# Patient Record
Sex: Female | Born: 1950 | Race: White | Hispanic: No | Marital: Married | Smoking: Never smoker
Health system: Southern US, Community
[De-identification: ages and names within clinical notes are randomized; demographics above are authoritative.]

## PROBLEM LIST (undated history)

## (undated) DIAGNOSIS — K209 Esophagitis, unspecified without bleeding: Secondary | ICD-10-CM

## (undated) DIAGNOSIS — M170 Bilateral primary osteoarthritis of knee: Secondary | ICD-10-CM

## (undated) DIAGNOSIS — J45909 Unspecified asthma, uncomplicated: Secondary | ICD-10-CM

## (undated) DIAGNOSIS — K5790 Diverticulosis of intestine, part unspecified, without perforation or abscess without bleeding: Secondary | ICD-10-CM

## (undated) DIAGNOSIS — J069 Acute upper respiratory infection, unspecified: Secondary | ICD-10-CM

## (undated) DIAGNOSIS — K219 Gastro-esophageal reflux disease without esophagitis: Secondary | ICD-10-CM

## (undated) HISTORY — PX: ABDOMINAL HYSTERECTOMY: SHX81

## (undated) HISTORY — PX: JOINT REPLACEMENT: SHX530

## (undated) HISTORY — DX: Acute upper respiratory infection, unspecified: J06.9

## (undated) HISTORY — PX: ADENOIDECTOMY: SUR15

## (undated) HISTORY — DX: Bilateral primary osteoarthritis of knee: M17.0

## (undated) HISTORY — PX: TONSILLECTOMY: SUR1361

---

## 1993-01-03 HISTORY — PX: FOOT SURGERY: SHX648

## 1998-04-02 ENCOUNTER — Encounter: Admission: RE | Admit: 1998-04-02 | Discharge: 1998-04-15 | Payer: Self-pay | Admitting: Orthopaedic Surgery

## 1999-10-14 ENCOUNTER — Encounter: Admission: RE | Admit: 1999-10-14 | Discharge: 1999-10-14 | Payer: Self-pay | Admitting: *Deleted

## 1999-10-14 ENCOUNTER — Encounter: Payer: Self-pay | Admitting: *Deleted

## 1999-11-16 ENCOUNTER — Other Ambulatory Visit: Admission: RE | Admit: 1999-11-16 | Discharge: 1999-11-16 | Payer: Self-pay | Admitting: Gynecology

## 2001-03-16 ENCOUNTER — Encounter: Payer: Self-pay | Admitting: *Deleted

## 2001-03-16 ENCOUNTER — Encounter: Admission: RE | Admit: 2001-03-16 | Discharge: 2001-03-16 | Payer: Self-pay | Admitting: *Deleted

## 2002-03-05 ENCOUNTER — Other Ambulatory Visit: Admission: RE | Admit: 2002-03-05 | Discharge: 2002-03-05 | Payer: Self-pay | Admitting: Gynecology

## 2002-07-22 ENCOUNTER — Encounter: Admission: RE | Admit: 2002-07-22 | Discharge: 2002-07-22 | Payer: Self-pay

## 2009-12-02 ENCOUNTER — Encounter: Admission: RE | Admit: 2009-12-02 | Discharge: 2009-12-02 | Payer: Self-pay | Admitting: Gastroenterology

## 2009-12-04 ENCOUNTER — Ambulatory Visit (HOSPITAL_COMMUNITY)
Admission: RE | Admit: 2009-12-04 | Discharge: 2009-12-04 | Payer: Self-pay | Source: Home / Self Care | Admitting: Gastroenterology

## 2009-12-08 ENCOUNTER — Other Ambulatory Visit
Admission: RE | Admit: 2009-12-08 | Discharge: 2009-12-08 | Payer: Self-pay | Source: Home / Self Care | Admitting: Gastroenterology

## 2009-12-23 ENCOUNTER — Ambulatory Visit (HOSPITAL_COMMUNITY)
Admission: RE | Admit: 2009-12-23 | Discharge: 2009-12-23 | Payer: Self-pay | Source: Home / Self Care | Attending: Gastroenterology | Admitting: Gastroenterology

## 2010-02-23 ENCOUNTER — Other Ambulatory Visit: Payer: Self-pay | Admitting: Dermatology

## 2011-03-01 ENCOUNTER — Other Ambulatory Visit: Payer: Self-pay | Admitting: Dermatology

## 2011-10-11 ENCOUNTER — Other Ambulatory Visit: Payer: Self-pay | Admitting: Dermatology

## 2011-12-08 ENCOUNTER — Other Ambulatory Visit: Payer: Self-pay | Admitting: Family Medicine

## 2011-12-08 DIAGNOSIS — R918 Other nonspecific abnormal finding of lung field: Secondary | ICD-10-CM

## 2011-12-15 ENCOUNTER — Ambulatory Visit
Admission: RE | Admit: 2011-12-15 | Discharge: 2011-12-15 | Disposition: A | Discharge status: Home / Self Care | Payer: Managed Care, Other (non HMO) | Source: Ambulatory Visit | Attending: Family Medicine | Admitting: Family Medicine

## 2011-12-15 DIAGNOSIS — R918 Other nonspecific abnormal finding of lung field: Secondary | ICD-10-CM

## 2011-12-15 MED ORDER — IOHEXOL 300 MG/ML  SOLN
75.0000 mL | Freq: Once | INTRAMUSCULAR | Status: AC | PRN
  Administered 2011-12-15: 75 mL via INTRAVENOUS

## 2011-12-19 ENCOUNTER — Other Ambulatory Visit: Payer: Self-pay | Admitting: Gynecology

## 2011-12-23 ENCOUNTER — Other Ambulatory Visit: Payer: Self-pay | Admitting: Gynecology

## 2011-12-23 DIAGNOSIS — R928 Other abnormal and inconclusive findings on diagnostic imaging of breast: Secondary | ICD-10-CM

## 2012-01-10 ENCOUNTER — Ambulatory Visit
Admission: RE | Admit: 2012-01-10 | Discharge: 2012-01-10 | Disposition: A | Discharge status: Home / Self Care | Payer: Managed Care, Other (non HMO) | Source: Ambulatory Visit | Attending: Gynecology | Admitting: Gynecology

## 2012-01-10 DIAGNOSIS — R928 Other abnormal and inconclusive findings on diagnostic imaging of breast: Secondary | ICD-10-CM

## 2012-01-14 ENCOUNTER — Encounter (HOSPITAL_COMMUNITY): Payer: Self-pay | Admitting: Emergency Medicine

## 2012-01-14 ENCOUNTER — Emergency Department (HOSPITAL_COMMUNITY): Payer: Managed Care, Other (non HMO)

## 2012-01-14 ENCOUNTER — Inpatient Hospital Stay (HOSPITAL_COMMUNITY)
Admission: EM | Admit: 2012-01-14 | Discharge: 2012-01-31 | DRG: 392 | Disposition: A | Discharge status: Home / Self Care | Payer: Managed Care, Other (non HMO) | Attending: Internal Medicine | Admitting: Internal Medicine

## 2012-01-14 DIAGNOSIS — K5732 Diverticulitis of large intestine without perforation or abscess without bleeding: Principal | ICD-10-CM

## 2012-01-14 DIAGNOSIS — R7402 Elevation of levels of lactic acid dehydrogenase (LDH): Secondary | ICD-10-CM | POA: Diagnosis present

## 2012-01-14 DIAGNOSIS — K5792 Diverticulitis of intestine, part unspecified, without perforation or abscess without bleeding: Secondary | ICD-10-CM

## 2012-01-14 DIAGNOSIS — J45909 Unspecified asthma, uncomplicated: Secondary | ICD-10-CM | POA: Diagnosis present

## 2012-01-14 DIAGNOSIS — R748 Abnormal levels of other serum enzymes: Secondary | ICD-10-CM | POA: Diagnosis present

## 2012-01-14 DIAGNOSIS — R112 Nausea with vomiting, unspecified: Secondary | ICD-10-CM

## 2012-01-14 DIAGNOSIS — E876 Hypokalemia: Secondary | ICD-10-CM

## 2012-01-14 DIAGNOSIS — K219 Gastro-esophageal reflux disease without esophagitis: Secondary | ICD-10-CM

## 2012-01-14 DIAGNOSIS — E871 Hypo-osmolality and hyponatremia: Secondary | ICD-10-CM | POA: Diagnosis present

## 2012-01-14 DIAGNOSIS — Z79899 Other long term (current) drug therapy: Secondary | ICD-10-CM

## 2012-01-14 DIAGNOSIS — R7401 Elevation of levels of liver transaminase levels: Secondary | ICD-10-CM

## 2012-01-14 DIAGNOSIS — R1115 Cyclical vomiting syndrome unrelated to migraine: Secondary | ICD-10-CM

## 2012-01-14 DIAGNOSIS — R1013 Epigastric pain: Secondary | ICD-10-CM | POA: Diagnosis not present

## 2012-01-14 HISTORY — DX: Esophagitis, unspecified: K20.9

## 2012-01-14 HISTORY — DX: Gastro-esophageal reflux disease without esophagitis: K21.9

## 2012-01-14 HISTORY — DX: Unspecified asthma, uncomplicated: J45.909

## 2012-01-14 HISTORY — DX: Esophagitis, unspecified without bleeding: K20.90

## 2012-01-14 HISTORY — DX: Diverticulosis of intestine, part unspecified, without perforation or abscess without bleeding: K57.90

## 2012-01-14 LAB — CBC WITH DIFFERENTIAL/PLATELET
Basophils Absolute: 0 10*3/uL (ref 0.0–0.1)
Basophils Relative: 0 % (ref 0–1)
Eosinophils Absolute: 0 10*3/uL (ref 0.0–0.7)
MCH: 32.3 pg (ref 26.0–34.0)
MCHC: 34.4 g/dL (ref 30.0–36.0)
Neutro Abs: 11.6 10*3/uL — ABNORMAL HIGH (ref 1.7–7.7)
Neutrophils Relative %: 93 % — ABNORMAL HIGH (ref 43–77)
Platelets: 401 10*3/uL — ABNORMAL HIGH (ref 150–400)
RDW: 12.7 % (ref 11.5–15.5)

## 2012-01-14 LAB — COMPREHENSIVE METABOLIC PANEL
ALT: 13 U/L (ref 0–35)
AST: 18 U/L (ref 0–37)
CO2: 21 mEq/L (ref 19–32)
Calcium: 9.5 mg/dL (ref 8.4–10.5)
Chloride: 95 mEq/L — ABNORMAL LOW (ref 96–112)
GFR calc Af Amer: >90 mL/min (ref >90)
GFR calc non Af Amer: >90 mL/min (ref >90)
Glucose, Bld: 151 mg/dL — ABNORMAL HIGH (ref 70–99)
Sodium: 133 mEq/L — ABNORMAL LOW (ref 135–145)
Total Bilirubin: 0.8 mg/dL (ref 0.3–1.2)

## 2012-01-14 LAB — URINALYSIS, MICROSCOPIC ONLY
Bilirubin Urine: NEGATIVE
Nitrite: NEGATIVE
Protein, ur: 30 mg/dL — AB
Specific Gravity, Urine: 1.022 (ref 1.005–1.030)
Urobilinogen, UA: 0.2 mg/dL (ref 0.0–1.0)

## 2012-01-14 LAB — LIPASE, BLOOD: Lipase: 40 U/L (ref 11–59)

## 2012-01-14 MED ORDER — ONDANSETRON HCL 4 MG/2ML IJ SOLN
4.0000 mg | Freq: Once | INTRAMUSCULAR | Status: AC
  Administered 2012-01-14: 4 mg via INTRAVENOUS
  Filled 2012-01-14: qty 2

## 2012-01-14 MED ORDER — MONTELUKAST SODIUM 10 MG PO TABS
10.0000 mg | ORAL_TABLET | Freq: Every day | ORAL | Status: DC
  Administered 2012-01-14 – 2012-01-29 (×14): 10 mg via ORAL
  Filled 2012-01-14 (×18): qty 1

## 2012-01-14 MED ORDER — IOHEXOL 300 MG/ML  SOLN
100.0000 mL | Freq: Once | INTRAMUSCULAR | Status: AC | PRN
  Administered 2012-01-14: 100 mL via INTRAVENOUS

## 2012-01-14 MED ORDER — PROMETHAZINE HCL 25 MG/ML IJ SOLN
12.5000 mg | INTRAMUSCULAR | Status: DC | PRN
  Administered 2012-01-14 (×2): 12.5 mg via INTRAVENOUS
  Administered 2012-01-15 – 2012-01-16 (×6): 25 mg via INTRAVENOUS
  Administered 2012-01-16: 12.5 mg via INTRAVENOUS
  Administered 2012-01-16: 25 mg via INTRAVENOUS
  Filled 2012-01-14 (×10): qty 1

## 2012-01-14 MED ORDER — CIPROFLOXACIN IN D5W 400 MG/200ML IV SOLN
400.0000 mg | Freq: Two times a day (BID) | INTRAVENOUS | Status: DC
  Administered 2012-01-15 – 2012-01-16 (×3): 400 mg via INTRAVENOUS
  Filled 2012-01-14 (×5): qty 200

## 2012-01-14 MED ORDER — ACETAMINOPHEN 325 MG PO TABS: 650.0000 mg | ORAL_TABLET | Freq: Four times a day (QID) | ORAL | Status: DC | PRN

## 2012-01-14 MED ORDER — CHLORHEXIDINE GLUCONATE 0.12 % MT SOLN
15.0000 mL | Freq: Two times a day (BID) | OROMUCOSAL | Status: DC
  Administered 2012-01-14 – 2012-01-19 (×9): 15 mL via OROMUCOSAL
  Filled 2012-01-14 (×12): qty 15

## 2012-01-14 MED ORDER — PROMETHAZINE HCL 25 MG/ML IJ SOLN
12.5000 mg | Freq: Once | INTRAMUSCULAR | Status: AC
  Administered 2012-01-14: 12.5 mg via INTRAVENOUS
  Filled 2012-01-14: qty 1

## 2012-01-14 MED ORDER — METRONIDAZOLE IN NACL 5-0.79 MG/ML-% IV SOLN
500.0000 mg | Freq: Three times a day (TID) | INTRAVENOUS | Status: DC
  Administered 2012-01-14 – 2012-01-16 (×6): 500 mg via INTRAVENOUS
  Filled 2012-01-14 (×7): qty 100

## 2012-01-14 MED ORDER — PANTOPRAZOLE SODIUM 40 MG IV SOLR
40.0000 mg | Freq: Two times a day (BID) | INTRAVENOUS | Status: DC
  Administered 2012-01-14 – 2012-01-16 (×4): 40 mg via INTRAVENOUS
  Filled 2012-01-14 (×5): qty 40

## 2012-01-14 MED ORDER — AZELASTINE HCL 0.1 % NA SOLN
1.0000 | Freq: Two times a day (BID) | NASAL | Status: DC
  Administered 2012-01-14 – 2012-01-31 (×33): 1 via NASAL
  Filled 2012-01-14 (×2): qty 30

## 2012-01-14 MED ORDER — FAMOTIDINE IN NACL 20-0.9 MG/50ML-% IV SOLN
20.0000 mg | Freq: Once | INTRAVENOUS | Status: AC
  Administered 2012-01-14: 20 mg via INTRAVENOUS
  Filled 2012-01-14: qty 50

## 2012-01-14 MED ORDER — BIOTENE DRY MOUTH MT LIQD
15.0000 mL | Freq: Two times a day (BID) | OROMUCOSAL | Status: DC
  Administered 2012-01-16 – 2012-01-19 (×7): 15 mL via OROMUCOSAL

## 2012-01-14 MED ORDER — SODIUM CHLORIDE 0.9 % IV SOLN
INTRAVENOUS | Status: DC
  Administered 2012-01-14: 14:00:00 via INTRAVENOUS

## 2012-01-14 MED ORDER — SODIUM CHLORIDE 0.9 % IV SOLN
INTRAVENOUS | Status: DC
  Administered 2012-01-14 – 2012-01-17 (×4): via INTRAVENOUS

## 2012-01-14 MED ORDER — METRONIDAZOLE IN NACL 5-0.79 MG/ML-% IV SOLN: 500.0000 mg | Freq: Once | INTRAVENOUS | Status: DC

## 2012-01-14 MED ORDER — CIPROFLOXACIN IN D5W 400 MG/200ML IV SOLN
400.0000 mg | Freq: Once | INTRAVENOUS | Status: AC
  Administered 2012-01-14: 400 mg via INTRAVENOUS
  Filled 2012-01-14: qty 200

## 2012-01-14 MED ORDER — ACETAMINOPHEN 650 MG RE SUPP: 650.0000 mg | Freq: Four times a day (QID) | RECTAL | Status: DC | PRN

## 2012-01-14 MED ORDER — FLUTICASONE PROPIONATE 50 MCG/ACT NA SUSP
2.0000 | Freq: Every day | NASAL | Status: DC
  Administered 2012-01-14 – 2012-01-31 (×17): 2 via NASAL
  Filled 2012-01-14: qty 16

## 2012-01-14 MED ORDER — ENOXAPARIN SODIUM 40 MG/0.4ML ~~LOC~~ SOLN
40.0000 mg | SUBCUTANEOUS | Status: DC
  Administered 2012-01-14 – 2012-01-15 (×2): 40 mg via SUBCUTANEOUS
  Filled 2012-01-14 (×3): qty 0.4

## 2012-01-14 MED ORDER — MORPHINE SULFATE 2 MG/ML IJ SOLN
1.0000 mg | INTRAMUSCULAR | Status: DC | PRN
  Administered 2012-01-14 – 2012-01-15 (×3): 2 mg via INTRAVENOUS
  Administered 2012-01-16: 1 mg via INTRAVENOUS
  Administered 2012-01-16 – 2012-01-29 (×9): 2 mg via INTRAVENOUS
  Filled 2012-01-14 (×14): qty 1

## 2012-01-14 NOTE — ED Notes (Signed)
Pt states that she has hx of NV due to esophagitis, gerd.  States that her sx today feel the same.  NV since last night.  Denies diarrhea.

## 2012-01-14 NOTE — ED Notes (Signed)
Pt still having some emesis, states she hold off on nausea med for now

## 2012-01-14 NOTE — ED Provider Notes (Signed)
History     CSN: [id number]  Arrival date & time 01/14/12  1140   First MD Initiated Contact with Patient 01/14/12 1220      Chief Complaint  Patient presents with  . Nausea  . Emesis     HPI Pt was seen at 1315.  Per pt, c/o gradual onset and persistence of constant left sided abd "pain" for the past 2 days.  Has been associated with multiple intermittent episodes of N/V since last night.  States [place] symptoms, dx intractable N/V and "esophagitis."  Denies hx of diverticulitis.  Denies diarrhea, no fevers, no back pain, no rash, no CP/SOB, no black or blood in stools or emesis.       Past Medical History  Diagnosis Date  . GERD (gastroesophageal reflux disease)   . Asthma   . Esophagitis   . Diverticulosis     Past Surgical History  Procedure Date  . Abdominal hysterectomy   . Tonsillectomy     History  Substance Use Topics  . Smoking status: Never Smoker   . Smokeless tobacco: Not on file  . Alcohol Use: No     Review of Systems ROS: Statement: All systems negative except as marked or noted in the HPI; Constitutional: Negative for fever and chills. ; ; Eyes: Negative for eye pain, redness and discharge. ; ; ENMT: Negative for ear pain, hoarseness, nasal congestion, sinus pressure and sore throat. ; ; Cardiovascular: Negative for chest pain, palpitations, diaphoresis, dyspnea and peripheral edema. ; ; Respiratory: Negative for cough, wheezing and stridor. ; ; Gastrointestinal: +N/V, abd pain. Negative for diarrhea, blood in stool, hematemesis, jaundice and rectal bleeding. . ; ; Genitourinary: Negative for dysuria, flank pain and hematuria. ; ; Musculoskeletal: Negative for back pain and neck pain. Negative for swelling and trauma.; ; Skin: Negative for pruritus, rash, abrasions, blisters, bruising and skin lesion.; ; Neuro: Negative for headache, lightheadedness and neck stiffness. Negative for weakness, altered level of  consciousness , altered mental status, extremity weakness, paresthesias, involuntary movement, seizure and syncope.       Allergies  Review of patient's allergies indicates no known allergies.  Home Medications   Current Outpatient Rx  Name  Route  Sig  Dispense  Refill  . AZELASTINE HCL 137 MCG/SPRAY NA SOLN   Nasal   Place 1 spray into the nose 2 (two) times daily. Use in each nostril as directed         . CALCIUM CARBONATE 600 MG PO TABS   Oral   Take 600 mg by mouth daily.         . LUBIPROSTONE 24 MCG PO CAPS   Oral   Take 24 mcg by mouth 2 (two) times daily with a meal.         . MOMETASONE FUROATE 50 MCG/ACT NA SUSP   Nasal   Place 2 sprays into the nose daily as needed. Allergies         . MONTELUKAST SODIUM 10 MG PO TABS   Oral   Take 10 mg by mouth at bedtime.         Marland Kitchen ONDANSETRON HCL 4 MG PO TABS   Oral   Take 4 mg by mouth every 8 (eight) hours as needed. nausea           BP 139/67  Pulse 70  Temp 98.8 F (37.1 C) (Oral)  Resp 16  SpO2 100%  Physical Exam 1320: Physical examination:  Nursing notes reviewed; Vital signs and O2 SAT reviewed;  Constitutional: Well developed, Well nourished, Uncomfortable appearing.; Head:  Normocephalic, atraumatic; Eyes: EOMI, PERRL, No scleral icterus; ENMT: Mouth and pharynx normal, Mucous membranes dry; Neck: Supple, Full range of motion, No lymphadenopathy; Cardiovascular: Regular rate and rhythm, No murmur, rub, or gallop; Respiratory: Breath sounds clear & equal bilaterally, No rales, rhonchi, wheezes.  Speaking full sentences with ease, Normal respiratory effort/excursion; Chest: Nontender, Movement normal; Abdomen: Soft, +LUQ and LLQ tender to palp. No rebound or guarding. Nondistended, Normal bowel sounds. Rectal exam performed w/permission of pt and ED RN chaparone present.  Anal tone normal.  Non-tender, soft brown stool in rectal vault, heme neg.  No fissures, no external hemorrhoids, no palp masses.;;  Genitourinary: No CVA tenderness; Extremities: Pulses normal, No tenderness, No edema, No calf edema or asymmetry.; Neuro: AA&Ox3, Major CN grossly intact.  Speech clear. No gross focal motor or sensory deficits in extremities.; Skin: Color normal, Warm, Dry.   ED Course  Procedures     MDM  MDM Reviewed: nursing note, vitals and previous chart Interpretation: labs, x-ray and CT scan   Results for orders placed during the hospital encounter of 01/14/12  URINALYSIS, MICROSCOPIC ONLY      Component Value Range   Color, Urine YELLOW  YELLOW   APPearance TURBID (*) CLEAR   Specific Gravity, Urine 1.022  1.005 - 1.030   pH 8.0  5.0 - 8.0   Glucose, UA NEGATIVE  NEGATIVE mg/dL   Hgb urine dipstick SMALL (*) NEGATIVE   Bilirubin Urine NEGATIVE  NEGATIVE   Ketones, ur >80 (*) NEGATIVE mg/dL   Protein, ur 30 (*) NEGATIVE mg/dL   Urobilinogen, UA 0.2  0.0 - 1.0 mg/dL   Nitrite NEGATIVE  NEGATIVE   Leukocytes, UA NEGATIVE  NEGATIVE   WBC, UA 0-2  <3 WBC/hpf   RBC / HPF 0-2  <3 RBC/hpf   Bacteria, UA MANY (*) RARE   Squamous Epithelial / LPF RARE  RARE  LIPASE, BLOOD      Component Value Range   Lipase 40  11 - 59 U/L  COMPREHENSIVE METABOLIC PANEL      Component Value Range   Sodium 133 (*) 135 - 145 mEq/L   Potassium 3.7  3.5 - 5.1 mEq/L   Chloride 95 (*) 96 - 112 mEq/L   CO2 21  19 - 32 mEq/L   Glucose, Bld 151 (*) 70 - 99 mg/dL   BUN 10  6 - 23 mg/dL   Creatinine, Ser 1.61  0.50 - 1.10 mg/dL   Calcium 9.5  8.4 - 09.6 mg/dL   Total Protein 8.2  6.0 - 8.3 g/dL   Albumin 4.2  3.5 - 5.2 g/dL   AST 18  0 - 37 U/L   ALT 13  0 - 35 U/L   Alkaline Phosphatase 70  39 - 117 U/L   Total Bilirubin 0.8  0.3 - 1.2 mg/dL   GFR calc non Af Amer >90  >90 mL/min   GFR calc Af Amer >90  >90 mL/min  CBC WITH DIFFERENTIAL      Component Value Range   WBC 12.5 (*) 4.0 - 10.5 K/uL   RBC 4.18  3.87 - 5.11 MIL/uL   Hemoglobin 13.5  12.0 - 15.0 g/dL   HCT 04.5  40.9 - 81.1 %   MCV 93.8   78.0 - 100.0 fL   MCH 32.3  26.0 - 34.0 pg  MCHC 34.4  30.0 - 36.0 g/dL   RDW 09.8  11.9 - 14.7 %   Platelets 401 (*) 150 - 400 K/uL   Neutrophils Relative 93 (*) 43 - 77 %   Neutro Abs 11.6 (*) 1.7 - 7.7 K/uL   Lymphocytes Relative 4 (*) 12 - 46 %   Lymphs Abs 0.6 (*) 0.7 - 4.0 K/uL   Monocytes Relative 3  3 - 12 %   Monocytes Absolute 0.3  0.1 - 1.0 K/uL   Eosinophils Relative 0  0 - 5 %   Eosinophils Absolute 0.0  0.0 - 0.7 K/uL   Basophils Relative 0  0 - 1 %   Basophils Absolute 0.0  0.0 - 0.1 K/uL  OCCULT BLOOD, POC DEVICE      Component Value Range   Fecal Occult Bld NEGATIVE  NEGATIVE   Dg Chest 2 View 01/14/2012  *RADIOLOGY REPORT*  Clinical Data: Abdominal pain.  Nausea and vomiting  CHEST - 2 VIEW  Comparison: 12/15/2011  Findings: Normal heart size.  No pleural effusion or edema.  Lungs are hyperinflated but clear.  No airspace consolidation.  There is a curvature of the thoracic spine which is convex to the left.  IMPRESSION:  1.  No acute cardiopulmonary abnormalities.   Original Report Authenticated By: Signa Kell, M.D.     Ct Abdomen Pelvis W Contrast 01/14/2012  *RADIOLOGY REPORT*  Clinical Data: Abdominal pain.  CT ABDOMEN AND PELVIS WITH CONTRAST  Technique:  Multidetector CT imaging of the abdomen and pelvis was performed following the standard protocol during bolus administration of intravenous contrast.  Contrast: OMNIPAQUE IOHEXOL 300 MG/ML  SOLN  Comparison: No priors.  Findings:  Lung Bases: In the periphery of the right lower lobe (image 4 of series 5) there is an 8 x 5 mm ground-glass attenuation nodules. Other smaller pleural-based nodules are also noted in the left lower lobe.  A small hiatal hernia.  Abdomen/Pelvis:  The enhanced appearance of the liver, gallbladder, pancreas, spleen, bilateral adrenal glands and bilateral kidneys is unremarkable.  Numerous colonic diverticula are noted, particularly in the region of the sigmoid colon, where there is also  profound colonic wall thickening and inflammatory changes in the adjacent sigmoid mesocolon, as well as a small amount of adjacent reactive free fluid.  Findings are highly concerning for acute diverticulitis in the sigmoid colon.  No larger volume of ascites.  No pneumoperitoneum.  No pathologic distension of small bowel.  Status post hysterectomy.  A pessary is in place within the vagina. Urinary bladder is unremarkable in appearance.  Musculoskeletal: There are no aggressive appearing lytic or blastic lesions noted in the visualized portions of the skeleton. Dextroscoliosis of the lumbar spine.  IMPRESSION: 1.  Findings, as above, concerning for acute diverticulitis of the sigmoid colon.  No definite diverticular abscess or findings to suggest frank perforation at this time. 2. Multiple small pulmonary nodules in the lung bases, largest of which is a 8 x 5 mm subsolid nodule in the lateral aspect of the right lower lobe.  Initial follow-up by chest CT without contrast is recommended in 3 months to confirm persistence.   This recommendation follows the consensus statement: Recommendations for the Management of Subsolid Pulmonary Nodules Detected at CT:  A Statement from the Fleischner Society as published in Radiology 2013; 266:304-317. 3.  Additional incidental findings, as above.  These results were called by telephone on 01/14/2012 at 04:30 p.m. to Dr. Clarene Duke, who verbally acknowledged these results.  Original Report Authenticated By: Trudie Reed, M.D.      1650:    Acute diverticulitis on CT scan.  Pt continues to c/o nausea, with several episodes of vomiting while in the ED despite IV anti-emetic (phenergan per her request).  Will start IV cipro and flagyl.  Dx and testing d/w pt and family.  Questions answered.  Verb understanding, agreeable to admit.  Pt already aware of incidental finding of pulmonary nodule seen on CT scan today. T/C to [facility] Dr. Suanne Marker, case discussed, including:  HPI,  pertinent PM/SHx, VS/PE, dx testing, ED course and treatment:  Agreeable to admit, requests to write temporary orders, obtain medical bed to team 4.          Laray Anger, DO 01/15/12 2214

## 2012-01-14 NOTE — H&P (Signed)
[facility] History and Physical  ALEINA BURGIO ZOX:[id number] DOB: [birth year] DOA: 01/14/2012  Referring physician:  PCP: Allean Found, MD  Specialists:   Chief Complaint:  abdominal pain with nausea and vomiting  HPI: Mandy Hanson is a 62 y.o. female with history of GERD, esophagitis, and diverticulosis presents with above complaints. She states that she began having lower abdominal cramping pain 2 days ago, and yesterday she developed nausea and vomiting which has been persistent. She states that the vomitus is nonbloody. She denies diarrhea, fevers, and no melena. She was seen in the ED and a CT scan of her abdomen and pelvis showed findings concerning for acute diverticulitis of the sigmoid colon. No definite diverticular abscess or findings to suggest frank perforation. Her white cell count was 12.5. She was given doses of antiemetics in the ED but continued to vomit, she states Zofran doesn't help-but also so far not relieved with Phenergan. She is admitted for further evaluation and management. Review of Systems: The patient denies, fever, weight loss,, vision loss, decreased hearing, hoarseness, chest pain, syncope, dyspnea on exertion, peripheral edema, balance deficits, hemoptysis, melena, hematochezia, severe , hematuria, incontinence, genital sores, muscle weakness, suspicious skin lesions, transient blindness, difficulty walking, depression, unusual weight change, abnormal bleeding.   Past Medical History  Diagnosis Date  . GERD (gastroesophageal reflux disease)   . Asthma   . Esophagitis   . Diverticulosis    Past Surgical History  Procedure Date  . Abdominal hysterectomy   . Tonsillectomy    Social History:  reports that she has never smoked. She does not have any smokeless tobacco history on file. She reports that she does not drink alcohol or use illicit drugs.  where does patient live--home with her husband Can patient participate in ADLs-yes  No  Known Allergies  Family history: Denies any family history of colon cancer or inflammatory bowel disease  Prior to Admission medications   Medication Sig Start Date End Date Taking? Authorizing Provider  azelastine (ASTELIN) 137 MCG/SPRAY nasal spray Place 1 spray into the nose 2 (two) times daily. Use in each nostril as directed   Yes Historical Provider, MD  calcium carbonate (OS-CAL) 600 MG TABS Take 600 mg by mouth daily.   Yes Historical Provider, MD  lubiprostone (AMITIZA) 24 MCG capsule Take 24 mcg by mouth 2 (two) times daily with a meal.   Yes Historical Provider, MD  mometasone (NASONEX) 50 MCG/ACT nasal spray Place 2 sprays into the nose daily as needed. Allergies   Yes Historical Provider, MD  montelukast (SINGULAIR) 10 MG tablet Take 10 mg by mouth at bedtime.   Yes Historical Provider, MD  ondansetron (ZOFRAN) 4 MG tablet Take 4 mg by mouth every 8 (eight) hours as needed. nausea   Yes Historical Provider, MD   Physical Exam: Filed Vitals:   01/14/12 1240 01/14/12 1624  BP: 136/67 139/67  Pulse: 72 70  Temp: 97.3 F (36.3 C) 98.8 F (37.1 C)  TempSrc:  Oral  Resp: 16   SpO2: 100% 100%    Constitutional: Vital signs reviewed.  Patient is a well-developed and well-nourished, acutely ill appearing, in no acute distress  cooperative with exam. Alert and oriented x3.  Head: Normocephalic and atraumatic Mouth: no erythema or exudates, dry MM Eyes: PERRL, EOMI, conjunctivae normal, No scleral icterus.  Neck: Supple, Trachea midline normal ROM, No JVD, mass, thyromegaly, or carotid bruit present.  Cardiovascular: RRR, S1 normal, S2 normal, no MRG, pulses symmetric and intact bilaterally Pulmonary/Chest:  CTAB, no wheezes, rales, or rhonchi Abdominal: Soft. Epigastric and lower abdominal tenderness present, no rebound.non-distended, bowel sounds are normal, no masses, organomegaly, or guarding present.   Extremities: No cyanosis and no edema  Neurological: A&O x3, Strength is  normal and symmetric bilaterally, cranial nerve II-XII are grossly intact, no focal motor deficit, sensory intact to light touch bilaterally.  Skin: Warm, dry and intact. No rash, cyanosis, or clubbing.  Psychiatric: Normal mood and affect. speech and behavior is normal. Judgment and thought content normal. Cognition and memory are normal.    Labs on Admission:  Basic Metabolic Panel:  Lab 01/14/12 1610  NA 133*  K 3.7  CL 95*  CO2 21  GLUCOSE 151*  BUN 10  CREATININE 0.64  CALCIUM 9.5  MG --  PHOS --   Liver Function Tests:  Lab 01/14/12 1300  AST 18  ALT 13  ALKPHOS 70  BILITOT 0.8  PROT 8.2  ALBUMIN 4.2    Lab 01/14/12 1300  LIPASE 40  AMYLASE --   No results found for this basename: AMMONIA:5 in the last 168 hours CBC:  Lab 01/14/12 1300  WBC 12.5*  NEUTROABS 11.6*  HGB 13.5  HCT 39.2  MCV 93.8  PLT 401*   Cardiac Enzymes: No results found for this basename: CKTOTAL:5,CKMB:5,CKMBINDEX:5,TROPONINI:5 in the last 168 hours  BNP (last 3 results) No results found for this basename: PROBNP:3 in the last 8760 hours CBG: No results found for this basename: GLUCAP:5 in the last 168 hours  Radiological Exams on Admission: Dg Chest 2 View  01/14/2012  *RADIOLOGY REPORT*  Clinical Data: Abdominal pain.  Nausea and vomiting  CHEST - 2 VIEW  Comparison: 12/15/2011  Findings: Normal heart size.  No pleural effusion or edema.  Lungs are hyperinflated but clear.  No airspace consolidation.  There is a curvature of the thoracic spine which is convex to the left.  IMPRESSION:  1.  No acute cardiopulmonary abnormalities.   Original Report Authenticated By: Signa Kell, M.D.    Ct Abdomen Pelvis W Contrast  01/14/2012  *RADIOLOGY REPORT*  Clinical Data: Abdominal pain.  CT ABDOMEN AND PELVIS WITH CONTRAST  Technique:  Multidetector CT imaging of the abdomen and pelvis was performed following the standard protocol during bolus administration of intravenous contrast.   Contrast: OMNIPAQUE IOHEXOL 300 MG/ML  SOLN  Comparison: No priors.  Findings:  Lung Bases: In the periphery of the right lower lobe (image 4 of series 5) there is an 8 x 5 mm ground-glass attenuation nodules. Other smaller pleural-based nodules are also noted in the left lower lobe.  A small hiatal hernia.  Abdomen/Pelvis:  The enhanced appearance of the liver, gallbladder, pancreas, spleen, bilateral adrenal glands and bilateral kidneys is unremarkable.  Numerous colonic diverticula are noted, particularly in the region of the sigmoid colon, where there is also profound colonic wall thickening and inflammatory changes in the adjacent sigmoid mesocolon, as well as a small amount of adjacent reactive free fluid.  Findings are highly concerning for acute diverticulitis in the sigmoid colon.  No larger volume of ascites.  No pneumoperitoneum.  No pathologic distension of small bowel.  Status post hysterectomy.  A pessary is in place within the vagina. Urinary bladder is unremarkable in appearance.  Musculoskeletal: There are no aggressive appearing lytic or blastic lesions noted in the visualized portions of the skeleton. Dextroscoliosis of the lumbar spine.  IMPRESSION: 1.  Findings, as above, concerning for acute diverticulitis of the sigmoid colon.  No  definite diverticular abscess or findings to suggest frank perforation at this time. 2. Multiple small pulmonary nodules in the lung bases, largest of which is a 8 x 5 mm subsolid nodule in the lateral aspect of the right lower lobe.  Initial follow-up by chest CT without contrast is recommended in 3 months to confirm persistence.   This recommendation follows the consensus statement: Recommendations for the Management of Subsolid Pulmonary Nodules Detected at CT:  A Statement from the Fleischner Society as published in Radiology 2013; 266:304-317. 3.  Additional incidental findings, as above.  These results were called by telephone on 01/14/2012 at 04:30 p.m.  to Dr. Clarene Duke, who verbally acknowledged these results.   Original Report Authenticated By: Trudie Reed, M.D.       Assessment/Plan Active Problems:  Diverticulitis of colon (without mention of hemorrhage) -As discussed above, will start on empiric antibiotics with IV Cipro and Flagyl -supportive care-antiemetics, analgesics, follow. -Keep n.p.o. with IV fluids, and consider surgical consultation if not improving  Hyponatremia -Likely secondary to volume depletion, hydrate follow and recheck  GERD (gastroesophageal reflux disease) -Place on PPI, follow     Code Status: full Family Communication: husband at bedside Disposition Plan: admit to med floor  Time spent:>39mins  Kela Millin [facility] Pager [phone]  If 7PM-7AM, please contact night-coverage [email or web address] Password [email or web address] 01/14/2012, 5:32 PM

## 2012-01-15 LAB — BASIC METABOLIC PANEL
BUN: 8 mg/dL (ref 6–23)
Calcium: 8.6 mg/dL (ref 8.4–10.5)
Creatinine, Ser: 0.61 mg/dL (ref 0.50–1.10)
GFR calc non Af Amer: >90 mL/min (ref >90)
Glucose, Bld: 115 mg/dL — ABNORMAL HIGH (ref 70–99)

## 2012-01-15 LAB — URINE CULTURE

## 2012-01-15 LAB — CBC
MCH: 31.4 pg (ref 26.0–34.0)
MCHC: 33.2 g/dL (ref 30.0–36.0)
Platelets: 359 10*3/uL (ref 150–400)
RDW: 13 % (ref 11.5–15.5)

## 2012-01-15 MED ORDER — POTASSIUM CHLORIDE CRYS ER 20 MEQ PO TBCR
40.0000 meq | EXTENDED_RELEASE_TABLET | Freq: Once | ORAL | Status: AC
  Administered 2012-01-15: 40 meq via ORAL
  Filled 2012-01-15: qty 2

## 2012-01-15 MED ORDER — POLYETHYLENE GLYCOL 3350 17 G PO PACK
17.0000 g | PACK | Freq: Every day | ORAL | Status: DC
  Administered 2012-01-15 – 2012-01-30 (×10): 17 g via ORAL
  Filled 2012-01-15 (×17): qty 1

## 2012-01-15 MED ORDER — DOCUSATE SODIUM 100 MG PO CAPS
100.0000 mg | ORAL_CAPSULE | Freq: Two times a day (BID) | ORAL | Status: DC
  Administered 2012-01-15 – 2012-01-31 (×24): 100 mg via ORAL
  Filled 2012-01-15 (×34): qty 1

## 2012-01-15 NOTE — Progress Notes (Addendum)
Patient ID: Mandy Hanson, female   DOB: 1950-05-25, 62 y.o.   MRN: [id number]  [facility] PROGRESS NOTE  Mandy Hanson:[id number] DOB: 02/06/1950 DOA: 01/14/2012 PCP: Allean Found, MD  Brief narrative: Pt is 62 yo female admitted with abdominal pain, N/V. Diagnosis of acute diverticulitis made.  Active Problems:  Diverticulitis of colon (without mention of hemorrhage) - pt clinically improving and tolerating current diet well - will continue Cipro and Flagyl IV and plan on transitioning to PO in AM - CBC and BMP in AM  Hyponatremia - secondary to dehydration - improving - continue IVF - encourage PO intake if pt tolerating  Hypokalemia - will supplement today and obtain BMP in AM  GERD (gastroesophageal reflux disease) - stable  Consultants:  None  Procedures/Studies: Dg Chest 2 View 01/14/2012   1.  No acute cardiopulmonary abnormalities.    Ct Abdomen Pelvis W Contrast 01/14/2012   1.  Findings, as above, concerning for acute diverticulitis of the sigmoid colon.  No definite diverticular abscess or findings to suggest frank perforation at this time.  2. Multiple small pulmonary nodules in the lung bases, largest of which is a 8 x 5 mm subsolid nodule in the lateral aspect of the right lower lobe.  Initial follow-up by chest CT without contrast is recommended in 3 months to confirm persistence.   3.  Additional incidental findings, as above.    Antibiotics:  Cipro 1/11 -->  Flagyl 1/11 -->  Code Status: Full Family Communication: Pt at bedside Disposition Plan: Home when medically stable  HPI/Subjective: No events overnight.   Objective: Filed Vitals:   01/14/12 2117 01/15/12 0130 01/15/12 0544 01/15/12 1400  BP: 137/55 103/45 139/57 134/69  Pulse: 80 72 78 82  Temp: 99.5 F (37.5 C) 98.8 F (37.1 C) 99 F (37.2 C) 98.4 F (36.9 C)  TempSrc: Oral Oral Oral Oral  Resp: 20 16 16 18   Height:      Weight:      SpO2: 100% 99% 100% 100%     Intake/Output Summary (Last 24 hours) at 01/15/12 1902 Last data filed at 01/15/12 1600  Gross per 24 hour  Intake 2388.34 ml  Output   1800 ml  Net 588.34 ml    Exam:   General:  Pt is alert, follows commands appropriately, not in acute distress  Cardiovascular: Regular rate and rhythm, S1/S2, no murmurs, no rubs, no gallops  Respiratory: Clear to auscultation bilaterally, no wheezing, no crackles, no rhonchi  Abdomen: Soft, non tender, non distended, bowel sounds present, no guarding  Extremities: No edema, pulses DP and PT palpable bilaterally  Neuro: Grossly nonfocal  Data Reviewed: Basic Metabolic Panel:  Lab 01/15/12 4401 01/14/12 1300  NA 134* 133*  K 3.4* 3.7  CL 99 95*  CO2 22 21  GLUCOSE 115* 151*  BUN 8 10  CREATININE 0.61 0.64  CALCIUM 8.6 9.5  MG -- --  PHOS -- --   Liver Function Tests:  Lab 01/14/12 1300  AST 18  ALT 13  ALKPHOS 70  BILITOT 0.8  PROT 8.2  ALBUMIN 4.2    Lab 01/14/12 1300  LIPASE 40  AMYLASE --   No results found for this basename: AMMONIA:5 in the last 168 hours CBC:  Lab 01/15/12 0418 01/14/12 1300  WBC 11.0* 12.5*  NEUTROABS -- 11.6*  HGB 12.7 13.5  HCT 38.2 39.2  MCV 94.6 93.8  PLT 359 401*   Cardiac Enzymes: No results found for this basename:  CKTOTAL:5,CKMB:5,CKMBINDEX:5,TROPONINI:5 in the last 168 hours BNP: No components found with this basename: POCBNP:5 CBG: No results found for this basename: GLUCAP:5 in the last 168 hours  Recent Results (from the past 240 hour(s))  URINE CULTURE     Status: Normal   Collection Time   01/14/12  3:10 PM      Component Value Range Status Comment   Specimen Description URINE, CLEAN CATCH   Final    Special Requests NONE   Final    Culture  Setup Time 01/14/2012 18:50   Final    Colony Count 15,000 COLONIES/ML   Final    Culture     Final    Value: Multiple bacterial morphotypes present, none predominant. Suggest appropriate recollection if clinically  indicated.   Report Status 01/15/2012 FINAL   Final      Scheduled Meds:   . antiseptic oral rinse  15 mL Mouth Rinse q12n4p  . azelastine  1 spray Each Nare BID  . chlorhexidine  15 mL Mouth Rinse BID  . ciprofloxacin  400 mg Intravenous Q12H  . docusate sodium  100 mg Oral BID  . enoxaparin (LOVENOX) injection  40 mg Subcutaneous Q24H  . fluticasone  2 spray Each Nare Daily  . metronidazole  500 mg Intravenous Q8H  . montelukast  10 mg Oral QHS  . pantoprazole (PROTONIX) IV  40 mg Intravenous Q12H  . polyethylene glycol  17 g Oral Daily   Continuous Infusions:   . sodium chloride 100 mL/hr at 01/14/12 2055     Debbora Presto, MD  [facility] Pager [phone]  If 7PM-7AM, please contact night-coverage [email or web address] Password [email or web address] 01/15/2012, 7:02 PM   LOS: 1 day

## 2012-01-16 LAB — CBC
MCV: 94.1 fL (ref 78.0–100.0)
Platelets: 318 10*3/uL (ref 150–400)
RDW: 12.8 % (ref 11.5–15.5)
WBC: 7.5 10*3/uL (ref 4.0–10.5)

## 2012-01-16 LAB — BASIC METABOLIC PANEL
Chloride: 96 mEq/L (ref 96–112)
Creatinine, Ser: 0.53 mg/dL (ref 0.50–1.10)
GFR calc Af Amer: >90 mL/min (ref >90)
Potassium: 3 mEq/L — ABNORMAL LOW (ref 3.5–5.1)
Sodium: 130 mEq/L — ABNORMAL LOW (ref 135–145)

## 2012-01-16 MED ORDER — POTASSIUM CHLORIDE 10 MEQ/100ML IV SOLN
10.0000 meq | INTRAVENOUS | Status: DC
  Filled 2012-01-16 (×6): qty 100

## 2012-01-16 MED ORDER — MAGNESIUM CITRATE PO SOLN
1.0000 | Freq: Every day | ORAL | Status: DC
  Administered 2012-01-16: 1 via ORAL

## 2012-01-16 MED ORDER — LUBIPROSTONE 24 MCG PO CAPS
24.0000 ug | ORAL_CAPSULE | Freq: Two times a day (BID) | ORAL | Status: DC
  Administered 2012-01-16 – 2012-01-31 (×26): 24 ug via ORAL
  Filled 2012-01-16 (×34): qty 1

## 2012-01-16 MED ORDER — PANTOPRAZOLE SODIUM 40 MG PO TBEC
40.0000 mg | DELAYED_RELEASE_TABLET | Freq: Two times a day (BID) | ORAL | Status: DC
  Administered 2012-01-16 – 2012-01-18 (×4): 40 mg via ORAL
  Filled 2012-01-16 (×5): qty 1

## 2012-01-16 MED ORDER — POTASSIUM CHLORIDE 20 MEQ/15ML (10%) PO LIQD
40.0000 meq | Freq: Once | ORAL | Status: AC
  Administered 2012-01-16: 40 meq via ORAL
  Filled 2012-01-16: qty 30

## 2012-01-16 MED ORDER — METRONIDAZOLE 500 MG PO TABS
500.0000 mg | ORAL_TABLET | Freq: Three times a day (TID) | ORAL | Status: DC
  Administered 2012-01-16 – 2012-01-17 (×4): 500 mg via ORAL
  Filled 2012-01-16 (×6): qty 1

## 2012-01-16 MED ORDER — PROMETHAZINE HCL 25 MG/ML IJ SOLN
25.0000 mg | INTRAMUSCULAR | Status: DC | PRN
  Administered 2012-01-16 – 2012-01-30 (×44): 25 mg via INTRAVENOUS
  Filled 2012-01-16 (×45): qty 1

## 2012-01-16 MED ORDER — CIPROFLOXACIN HCL 500 MG PO TABS
500.0000 mg | ORAL_TABLET | Freq: Two times a day (BID) | ORAL | Status: DC
  Administered 2012-01-16 – 2012-01-17 (×3): 500 mg via ORAL
  Filled 2012-01-16 (×5): qty 1

## 2012-01-16 MED ORDER — HYDROXYZINE HCL 50 MG/ML IM SOLN
50.0000 mg | Freq: Four times a day (QID) | INTRAMUSCULAR | Status: DC | PRN
  Filled 2012-01-16: qty 1

## 2012-01-16 MED ORDER — MAGIC MOUTHWASH
10.0000 mL | ORAL | Status: DC | PRN
  Administered 2012-01-27: 10 mL via ORAL
  Filled 2012-01-16 (×2): qty 10

## 2012-01-16 NOTE — Progress Notes (Signed)
PHARMACIST - PHYSICIAN COMMUNICATION CONCERNING:  IV to Oral Route Change Policy  RECOMMENDATION: This patient is receiving Protonix by the intravenous route.  Based on criteria approved by the Pharmacy and Therapeutics Committee, Protonix is being converted to the equivalent oral dose form(s).  DESCRIPTION: These criteria include:  Has a documented ability to take oral medications (tolerating diet of full liquids or better or  Gastric tube feedings for >24 hours OR taking other scheduled oral medications for >24 hours).  Expected plan for continued treatment for at least 1 day.  If you have questions about this conversion, please contact the Pharmacy Department  []   [phone] )  [facility] []   [phone] )  [facility]  []   [phone] )  [facility] [x]   [phone] )  [facility]   Geoffry Paradise, PharmD, BCPS Pager: [phone] 2:18 PM Pharmacy #: [id number]

## 2012-01-16 NOTE — Progress Notes (Signed)
Patient ID: Mandy Hanson, female   DOB: 14-Jun-1950, 62 y.o.   MRN: [id number]  [facility] PROGRESS NOTE  KHALIS HITTLE WUJ:[id number] DOB: 22-Apr-1950 DOA: 01/14/2012 PCP: Allean Found, MD  Brief narrative:  Pt is 62 yo female admitted with abdominal pain, N/V. Diagnosis of acute diverticulitis made.   Active Problems:  Diverticulitis of colon (without mention of hemorrhage)  - pt clinically improving and tolerating current diet well  - will continue Cipro and Flagyl but will change to PO today  - continue supportive care, IVF, antiemetics  - CBC and BMP in AM  Hyponatremia  - secondary to dehydration  - improving but sodium level still down - continue IVF  - encourage PO intake if pt tolerating  Hypokalemia  - will supplement today and obtain BMP in AM  GERD (gastroesophageal reflux disease)  - stable and will continue Protonix BID IV  Consultants:  None Procedures/Studies:  Dg Chest 2 View  01/14/2012  1. No acute cardiopulmonary abnormalities.  Ct Abdomen Pelvis W Contrast  01/14/2012  1. Findings, as above, concerning for acute diverticulitis of the sigmoid colon. No definite diverticular abscess or findings to suggest frank perforation at this time.  2. Multiple small pulmonary nodules in the lung bases, largest of which is a 8 x 5 mm subsolid nodule in the lateral aspect of the right lower lobe. Initial follow-up by chest CT without contrast is recommended in 3 months to confirm persistence.  3. Additional incidental findings, as above.  Antibiotics:  Cipro 1/11 -->  Flagyl 1/11 -->  Code Status: Full  Family Communication: Pt at bedside  Disposition Plan: Home when medically stable, likely in AM   HPI/Subjective: No events overnight. Pt still with nausea   Objective: Filed Vitals:   01/15/12 0544 01/15/12 1400 01/15/12 2051 01/16/12 0407  BP: 139/57 134/69 145/74 128/72  Pulse: 78 82 75 73  Temp: 99 F (37.2 C) 98.4 F (36.9 C) 98.1 F (36.7  C) 97.4 F (36.3 C)  TempSrc: Oral Oral Oral Oral  Resp: 16 18 18 18   Height:      Weight:      SpO2: 100% 100% 98% 100%    Intake/Output Summary (Last 24 hours) at 01/16/12 1343 Last data filed at 01/16/12 1000  Gross per 24 hour  Intake   3320 ml  Output   2100 ml  Net   1220 ml    Exam:   General:  Pt is alert, follows commands appropriately, not in acute distress  Cardiovascular: Regular rate and rhythm, S1/S2, no murmurs, no rubs, no gallops  Respiratory: Clear to auscultation bilaterally, no wheezing, no crackles, no rhonchi  Abdomen: Soft, non tender, non distended, bowel sounds present, no guarding  Extremities: No edema, pulses DP and PT palpable bilaterally  Neuro: Grossly nonfocal  Data Reviewed: Basic Metabolic Panel:  Lab 01/16/12 9562 01/15/12 0418 01/14/12 1300  NA 130* 134* 133*  K 3.0* 3.4* 3.7  CL 96 99 95*  CO2 24 22 21   GLUCOSE 115* 115* 151*  BUN 6 8 10   CREATININE 0.53 0.61 0.64  CALCIUM 8.2* 8.6 9.5  MG -- -- --  PHOS -- -- --   Liver Function Tests:  Lab 01/14/12 1300  AST 18  ALT 13  ALKPHOS 70  BILITOT 0.8  PROT 8.2  ALBUMIN 4.2    Lab 01/14/12 1300  LIPASE 40  AMYLASE --   CBC:  Lab 01/16/12 0410 01/15/12 0418 01/14/12 1300  WBC 7.5  11.0* 12.5*  NEUTROABS -- -- 11.6*  HGB 12.2 12.7 13.5  HCT 37.0 38.2 39.2  MCV 94.1 94.6 93.8  PLT 318 359 401*    Recent Results (from the past 240 hour(s))  URINE CULTURE     Status: Normal   Collection Time   01/14/12  3:10 PM      Component Value Range Status Comment   Specimen Description URINE, CLEAN CATCH   Final    Special Requests NONE   Final    Culture  Setup Time 01/14/2012 18:50   Final    Colony Count 15,000 COLONIES/ML   Final    Culture     Final    Value: Multiple bacterial morphotypes present, none predominant. Suggest appropriate recollection if clinically indicated.   Report Status 01/15/2012 FINAL   Final      Scheduled Meds:   . antiseptic oral rinse   15 mL Mouth Rinse q12n4p  . azelastine  1 spray Each Nare BID  . chlorhexidine  15 mL Mouth Rinse BID  . ciprofloxacin  500 mg Oral BID  . docusate sodium  100 mg Oral BID  . enoxaparin injection  40 mg Subcutaneous Q24H  . fluticasone  2 spray Each Nare Daily  . lubiprostone  24 mcg Oral BID WC  . magnesium citrate  1 Bottle Oral Daily  . metroNIDAZOLE  500 mg Oral Q8H  . montelukast  10 mg Oral QHS  . pantoprazole IV  40 mg Intravenous Q12H  . polyethylene glycol  17 g Oral Daily  . potassium chloride  10 mEq Intravenous Q1 Hr x 6   Continuous Infusions:   . sodium chloride 100 mL/hr at 01/15/12 1610     Debbora Presto, MD  [facility] Pager [phone]  If 7PM-7AM, please contact night-coverage [email or web address] Password [email or web address] 01/16/2012, 1:43 PM   LOS: 2 days

## 2012-01-16 NOTE — Care Management Note (Signed)
    Page 1 of 1   01/16/2012     12:02:23 PM   CARE MANAGEMENT NOTE 01/16/2012  Patient:  Mandy Hanson, Mandy Hanson   Account Number:  [id number]  Date Initiated:  01/16/2012  Documentation initiated by:  Lorenda Ishihara  Subjective/Objective Assessment:   62 yo female admitted with diverticulititis. PTA lived at home with spouse.     Action/Plan:   Home when stable   Anticipated DC Date:  01/20/2012   Anticipated DC Plan:  HOME/SELF CARE      DC Planning Services  CM consult      Choice offered to / List presented to:             Status of service:  Completed, signed off Medicare Important Message given?   (If response is "NO", the following Medicare IM given date fields will be blank) Date Medicare IM given:   Date Additional Medicare IM given:    Discharge Disposition:  HOME/SELF CARE  Per UR Regulation:  Reviewed for med. necessity/level of care/duration of stay  If discussed at Long Length of Stay Meetings, dates discussed:    Comments:

## 2012-01-17 LAB — CBC
Hemoglobin: 13.2 g/dL (ref 12.0–15.0)
MCV: 92.7 fL (ref 78.0–100.0)
Platelets: 345 10*3/uL (ref 150–400)
RBC: 4.12 MIL/uL (ref 3.87–5.11)
WBC: 5.8 10*3/uL (ref 4.0–10.5)

## 2012-01-17 LAB — BASIC METABOLIC PANEL
CO2: 26 mEq/L (ref 19–32)
Chloride: 95 mEq/L — ABNORMAL LOW (ref 96–112)
Creatinine, Ser: 0.54 mg/dL (ref 0.50–1.10)
Glucose, Bld: 109 mg/dL — ABNORMAL HIGH (ref 70–99)

## 2012-01-17 MED ORDER — ALUM & MAG HYDROXIDE-SIMETH 200-200-20 MG/5ML PO SUSP
30.0000 mL | ORAL | Status: DC | PRN
  Administered 2012-01-17 – 2012-01-22 (×4): 30 mL via ORAL
  Filled 2012-01-17 (×4): qty 30

## 2012-01-17 MED ORDER — LORAZEPAM 2 MG/ML IJ SOLN
0.5000 mg | Freq: Two times a day (BID) | INTRAMUSCULAR | Status: DC | PRN
  Administered 2012-01-21 – 2012-01-30 (×9): 0.5 mg via INTRAVENOUS
  Filled 2012-01-17 (×8): qty 1

## 2012-01-17 MED ORDER — PROMETHAZINE HCL 25 MG RE SUPP
25.0000 mg | Freq: Four times a day (QID) | RECTAL | Status: DC | PRN
  Administered 2012-01-17 – 2012-01-20 (×5): 25 mg via RECTAL
  Filled 2012-01-17 (×5): qty 1

## 2012-01-17 MED ORDER — CIPROFLOXACIN IN D5W 400 MG/200ML IV SOLN
400.0000 mg | Freq: Once | INTRAVENOUS | Status: AC
  Administered 2012-01-17: 400 mg via INTRAVENOUS
  Filled 2012-01-17: qty 200

## 2012-01-17 MED ORDER — METRONIDAZOLE IN NACL 5-0.79 MG/ML-% IV SOLN
500.0000 mg | Freq: Three times a day (TID) | INTRAVENOUS | Status: DC
  Administered 2012-01-18: 500 mg via INTRAVENOUS
  Filled 2012-01-17: qty 100

## 2012-01-17 MED ORDER — POTASSIUM CHLORIDE 20 MEQ/15ML (10%) PO LIQD
40.0000 meq | Freq: Two times a day (BID) | ORAL | Status: AC
  Administered 2012-01-17: 40 meq via ORAL
  Filled 2012-01-17 (×2): qty 30

## 2012-01-17 NOTE — Progress Notes (Signed)
Patient ID: Mandy Hanson, female   DOB: Jan 31, 1950, 62 y.o.   MRN: [id number]  [facility] PROGRESS NOTE  TNIYA BOWDITCH WUJ:[id number] DOB: Apr 04, 1950 DOA: 01/14/2012 PCP: Allean Found, MD  Brief narrative:  Pt is 62 yo female admitted with abdominal pain, N/V. Diagnosis of acute diverticulitis made.   Active Problems:  Diverticulitis of colon (without mention of hemorrhage)  - pt clinically improving and tolerating current diet well (full liquid), still occasional episodes of vomiting - pt prefers to continue oral Cipro and Flagyl for now along with antiemetics - pt also wants to try to advance diet to see if it helps, will attempt today - will increase Protonix to BID - continue supportive care, IVF, antiemetics  - CBC and BMP in AM  Hyponatremia  - secondary to dehydration  - improving but sodium level still down  - continue IVF  - encourage PO intake if pt tolerating  Hypokalemia  - will supplement today and obtain BMP in AM  GERD (gastroesophageal reflux disease)  - stable and will continue Protonix BID IV   Consultants:  None Procedures/Studies:  Dg Chest 2 View  01/14/2012  1. No acute cardiopulmonary abnormalities.  Ct Abdomen Pelvis W Contrast  01/14/2012  1. Findings, as above, concerning for acute diverticulitis of the sigmoid colon. No definite diverticular abscess or findings to suggest frank perforation at this time.  2. Multiple small pulmonary nodules in the lung bases, largest of which is a 8 x 5 mm subsolid nodule in the lateral aspect of the right lower lobe. Initial follow-up by chest CT without contrast is recommended in 3 months to confirm persistence.  3. Additional incidental findings, as above.  Antibiotics:  Cipro 1/11 -->  Flagyl 1/11 -->  Code Status: Full  Family Communication: Pt at bedside  Disposition Plan: Home when medically stable, likely in 1-2 days   HPI/Subjective: No events overnight. Pt still with nausea and  intermittent episodes of non bloody vomiting.   Objective: Filed Vitals:   01/16/12 1400 01/16/12 2205 01/17/12 0545 01/17/12 1400  BP: 144/73 149/79 137/80 151/80  Pulse: 66 75 69 70  Temp: 99.1 F (37.3 C) 97.9 F (36.6 C) 98.3 F (36.8 C) 99.5 F (37.5 C)  TempSrc: Oral Oral Oral Oral  Resp: 18 18 18 18   Height:      Weight:      SpO2: 100% 100% 100% 100%    Intake/Output Summary (Last 24 hours) at 01/17/12 1804 Last data filed at 01/17/12 1500  Gross per 24 hour  Intake 2203.33 ml  Output   2951 ml  Net -747.67 ml    Exam:   General:  Pt is alert, follows commands appropriately, not in acute distress  Cardiovascular: Regular rate and rhythm, S1/S2, no murmurs, no rubs, no gallops  Respiratory: Clear to auscultation bilaterally, no wheezing, no crackles, no rhonchi  Abdomen: Soft, non tender, non distended, bowel sounds present, no guarding  Extremities: No edema, pulses DP and PT palpable bilaterally  Neuro: Grossly nonfocal  Data Reviewed: Basic Metabolic Panel:  Lab 01/17/12 9562 01/16/12 0410 01/15/12 0418 01/14/12 1300  NA 130* 130* 134* 133*  K 3.0* 3.0* 3.4* 3.7  CL 95* 96 99 95*  CO2 26 24 22 21   GLUCOSE 109* 115* 115* 151*  BUN 5* 6 8 10   CREATININE 0.54 0.53 0.61 0.64  CALCIUM 8.3* 8.2* 8.6 9.5  MG -- -- -- --  PHOS -- -- -- --   Liver Function Tests:  Lab 01/14/12 1300  AST 18  ALT 13  ALKPHOS 70  BILITOT 0.8  PROT 8.2  ALBUMIN 4.2    Lab 01/14/12 1300  LIPASE 40  AMYLASE --   CBC:  Lab 01/17/12 0451 01/16/12 0410 01/15/12 0418 01/14/12 1300  WBC 5.8 7.5 11.0* 12.5*  NEUTROABS -- -- -- 11.6*  HGB 13.2 12.2 12.7 13.5  HCT 38.2 37.0 38.2 39.2  MCV 92.7 94.1 94.6 93.8  PLT 345 318 359 401*   Recent Results (from the past 240 hour(s))  URINE CULTURE     Status: Normal   Collection Time   01/14/12  3:10 PM      Component Value Range Status Comment   Specimen Description URINE, CLEAN CATCH   Final    Special Requests NONE    Final    Culture  Setup Time 01/14/2012 18:50   Final    Colony Count 15,000 COLONIES/ML   Final    Culture     Final    Value: Multiple bacterial morphotypes present, none predominant. Suggest appropriate recollection if clinically indicated.   Report Status 01/15/2012 FINAL   Final      Scheduled Meds:   . antiseptic oral rinse  15 mL Mouth Rinse q12n4p  . azelastine  1 spray Each Nare BID  . chlorhexidine  15 mL Mouth Rinse BID  . ciprofloxacin  500 mg Oral BID  . docusate sodium  100 mg Oral BID  . fluticasone  2 spray Each Nare Daily  . lubiprostone  24 mcg Oral BID WC  . magnesium citrate  1 Bottle Oral Daily  . metroNIDAZOLE  500 mg Oral Q8H  . montelukast  10 mg Oral QHS  . pantoprazole  40 mg Oral BID  . polyethylene glycol  17 g Oral Daily  . potassium chloride  40 mEq Oral BID   Continuous Infusions:   . sodium chloride 100 mL/hr at 01/17/12 1010     Debbora Presto, MD  [facility] Pager [phone]  If 7PM-7AM, please contact night-coverage [email or web address] Password [email or web address] 01/17/2012, 6:04 PM   LOS: 3 days

## 2012-01-18 DIAGNOSIS — E876 Hypokalemia: Secondary | ICD-10-CM

## 2012-01-18 DIAGNOSIS — R112 Nausea with vomiting, unspecified: Secondary | ICD-10-CM | POA: Diagnosis present

## 2012-01-18 LAB — BASIC METABOLIC PANEL
BUN: 5 mg/dL — ABNORMAL LOW (ref 6–23)
CO2: 25 mEq/L (ref 19–32)
Chloride: 95 mEq/L — ABNORMAL LOW (ref 96–112)
Creatinine, Ser: 0.61 mg/dL (ref 0.50–1.10)

## 2012-01-18 LAB — CBC
HCT: 41.1 % (ref 36.0–46.0)
MCH: 31.9 pg (ref 26.0–34.0)
MCV: 93 fL (ref 78.0–100.0)
RBC: 4.42 MIL/uL (ref 3.87–5.11)
RDW: 12.4 % (ref 11.5–15.5)
WBC: 5.3 10*3/uL (ref 4.0–10.5)

## 2012-01-18 MED ORDER — ENOXAPARIN SODIUM 40 MG/0.4ML ~~LOC~~ SOLN
40.0000 mg | SUBCUTANEOUS | Status: DC
  Administered 2012-01-18 – 2012-01-30 (×12): 40 mg via SUBCUTANEOUS
  Filled 2012-01-18 (×14): qty 0.4

## 2012-01-18 MED ORDER — CIPROFLOXACIN IN D5W 400 MG/200ML IV SOLN
400.0000 mg | Freq: Two times a day (BID) | INTRAVENOUS | Status: DC
  Administered 2012-01-18 – 2012-01-26 (×15): 400 mg via INTRAVENOUS
  Filled 2012-01-18 (×17): qty 200

## 2012-01-18 MED ORDER — POTASSIUM CHLORIDE IN NACL 20-0.9 MEQ/L-% IV SOLN
INTRAVENOUS | Status: DC
  Administered 2012-01-18 – 2012-01-19 (×2): via INTRAVENOUS
  Administered 2012-01-20: 100 mL via INTRAVENOUS
  Administered 2012-01-20 – 2012-01-25 (×10): via INTRAVENOUS
  Administered 2012-01-25: 125 mL/h via INTRAVENOUS
  Administered 2012-01-27 – 2012-01-30 (×5): via INTRAVENOUS
  Filled 2012-01-18 (×33): qty 1000

## 2012-01-18 MED ORDER — METRONIDAZOLE IN NACL 5-0.79 MG/ML-% IV SOLN
500.0000 mg | Freq: Three times a day (TID) | INTRAVENOUS | Status: DC
  Administered 2012-01-18 – 2012-01-19 (×4): 500 mg via INTRAVENOUS
  Filled 2012-01-18 (×8): qty 100

## 2012-01-18 MED ORDER — POTASSIUM CHLORIDE 20 MEQ/15ML (10%) PO LIQD
40.0000 meq | Freq: Once | ORAL | Status: AC
  Administered 2012-01-18: 40 meq via ORAL
  Filled 2012-01-18: qty 30

## 2012-01-18 MED ORDER — PANTOPRAZOLE SODIUM 40 MG IV SOLR
40.0000 mg | Freq: Two times a day (BID) | INTRAVENOUS | Status: DC
  Administered 2012-01-18 – 2012-01-27 (×17): 40 mg via INTRAVENOUS
  Filled 2012-01-18 (×21): qty 40

## 2012-01-18 NOTE — Progress Notes (Signed)
[facility] PROGRESS NOTE  Mandy Hanson YQM:[id number] DOB: [birth year] DOA: 01/14/2012  PCP: Allean Found, MD  Brief HPI: Mandy Hanson is a 62 y.o. female with history of GERD, esophagitis, and diverticulosis who presented with abdominal pain. She stated that she began having lower abdominal cramping pain 2 days prior to admission, and then she developed nausea and vomiting which has been persistent. She stated that the vomitus was nonbloody. She denied diarrhea, fevers, and no melena. She was seen in the ED and a CT scan of her abdomen and pelvis showed findings concerning for acute diverticulitis of the sigmoid colon. No definite diverticular abscess or findings to suggest frank perforation. Her white cell count was 12.5. She was given doses of antiemetics in the ED but continued to vomit, she states Zofran doesn't help-but also so far not relieved with Phenergan. She is admitted for further evaluation and management.  Past medical history:  Past Medical History  Diagnosis Date  . GERD (gastroesophageal reflux disease)   . Asthma   . Esophagitis   . Diverticulosis     Consultants: None  Procedures: None  Antibiotics: Cipro and Metronidazole  Subjective: Patient vomited her lunch. Denies any lower abdominal pain but has pain in upper abdomen which she attributes to gastric irritation from potassium. No fever.  Objective: Vital Signs  Filed Vitals:   01/17/12 0545 01/17/12 1400 01/17/12 2230 01/18/12 0610  BP: 137/80 151/80 117/81 137/75  Pulse: 69 70 80 70  Temp: 98.3 F (36.8 C) 99.5 F (37.5 C) 98.2 F (36.8 C) 99.2 F (37.3 C)  TempSrc: Oral Oral Oral Oral  Resp: 18 18 18 18   Height:      Weight:      SpO2: 100% 100% 100% 100%    Intake/Output Summary (Last 24 hours) at 01/18/12 1405 Last data filed at 01/18/12 1000  Gross per 24 hour  Intake   2200 ml  Output   2150 ml  Net     50 ml   Filed Weights   01/14/12 2049  Weight: 76.658 kg (169  lb)    General appearance: alert, cooperative, appears stated age and no distress Head: Normocephalic, without obvious abnormality, atraumatic Resp: clear to auscultation bilaterally Cardio: regular rate and rhythm, S1, S2 normal, no murmur, click, rub or gallop GI: Soft, tender in the apigastrium. No rebound rigidity. No masses or organomegaly. Extremities: extremities normal, atraumatic, no cyanosis or edema Skin: Skin color, texture, turgor normal. No rashes or lesions Lymph nodes: Cervical, supraclavicular, and axillary nodes normal. Neurologic: Alert and oriented X 3, normal strength and tone. Normal symmetric reflexes. Normal coordination and gait  Lab Results:  Basic Metabolic Panel:  Lab 01/18/12 5284 01/17/12 0451 01/16/12 0410 01/15/12 0418 01/14/12 1300  NA 131* 130* 130* 134* 133*  K 3.1* 3.0* 3.0* 3.4* 3.7  CL 95* 95* 96 99 95*  CO2 25 26 24 22 21   GLUCOSE 118* 109* 115* 115* 151*  BUN 5* 5* 6 8 10   CREATININE 0.61 0.54 0.53 0.61 0.64  CALCIUM 8.2* 8.3* 8.2* 8.6 9.5  MG -- -- -- -- --  PHOS -- -- -- -- --   Liver Function Tests:  Lab 01/14/12 1300  AST 18  ALT 13  ALKPHOS 70  BILITOT 0.8  PROT 8.2  ALBUMIN 4.2    Lab 01/14/12 1300  LIPASE 40  AMYLASE --   No results found for this basename: AMMONIA:5 in the last 168 hours CBC:  Lab 01/18/12 0419  01/17/12 0451 01/16/12 0410 01/15/12 0418 01/14/12 1300  WBC 5.3 5.8 7.5 11.0* 12.5*  NEUTROABS -- -- -- -- 11.6*  HGB 14.1 13.2 12.2 12.7 13.5  HCT 41.1 38.2 37.0 38.2 39.2  MCV 93.0 92.7 94.1 94.6 93.8  PLT 358 345 318 359 401*   Cardiac Enzymes: No results found for this basename: CKTOTAL:5,CKMB:5,CKMBINDEX:5,TROPONINI:5 in the last 168 hours BNP (last 3 results) No results found for this basename: PROBNP:3 in the last 8760 hours CBG: No results found for this basename: GLUCAP:5 in the last 168 hours  Recent Results (from the past 240 hour(s))  URINE CULTURE     Status: Normal   Collection Time     01/14/12  3:10 PM      Component Value Range Status Comment   Specimen Description URINE, CLEAN CATCH   Final    Special Requests NONE   Final    Culture  Setup Time 01/14/2012 18:50   Final    Colony Count 15,000 COLONIES/ML   Final    Culture     Final    Value: Multiple bacterial morphotypes present, none predominant. Suggest appropriate recollection if clinically indicated.   Report Status 01/15/2012 FINAL   Final       Studies/Results: No results found.  Medications:  Scheduled:    . antiseptic oral rinse  15 mL Mouth Rinse q12n4p  . azelastine  1 spray Each Nare BID  . chlorhexidine  15 mL Mouth Rinse BID  . ciprofloxacin  400 mg Intravenous Q12H  . docusate sodium  100 mg Oral BID  . fluticasone  2 spray Each Nare Daily  . lubiprostone  24 mcg Oral BID WC  . metronidazole  500 mg Intravenous Q8H  . montelukast  10 mg Oral QHS  . pantoprazole (PROTONIX) IV  40 mg Intravenous Q12H  . polyethylene glycol  17 g Oral Daily  . potassium chloride  40 mEq Oral Once   Continuous:    . 0.9 % NaCl with KCl 20 mEq / L     ZOX:[id number], acetaminophen, alum & mag hydroxide-simeth, hydrOXYzine, LORazepam, magic mouthwash, morphine injection, promethazine, promethazine  Assessment/Plan:  Principal Problem:  *Diverticulitis of colon (without mention of hemorrhage) Active Problems:  Hyponatremia  GERD (gastroesophageal reflux disease)  Nausea and vomiting in adult    Diverticulitis of colon (without mention of hemorrhage)  Patient vomited last night and today. She has had a set back. Will back off on diet and switch her back to IV antibiotics. Start PPI IV.   Nausea and Vomiting See above. Symptomatic treatment. Unlikely there has been worsening in her diverticulitis. Likely from gastric irritation.  Hyponatremia  Likely secondary to dehydration. Continue IVF.   Hypokalemia  Replete IV and PO. Check Magnesium.  GERD (gastroesophageal reflux disease)   Continue Protonix BID IV   Code Status Full Code  DVT Prophylaxis Initiate Lovenox  Family Communication: Discussed with patient  Disposition Plan: Unclear but will likely return home when better.    LOS: 4 days   [facility]  [facility] Pager [phone] 01/18/2012, 2:05 PM  If 8PM-8AM, please contact night-coverage at [email or web address], password [email or web address]

## 2012-01-19 LAB — CBC
Hemoglobin: 12.3 g/dL (ref 12.0–15.0)
MCH: 31.8 pg (ref 26.0–34.0)
MCV: 93.5 fL (ref 78.0–100.0)
RBC: 3.87 MIL/uL (ref 3.87–5.11)

## 2012-01-19 LAB — BASIC METABOLIC PANEL
BUN: 5 mg/dL — ABNORMAL LOW (ref 6–23)
CO2: 25 mEq/L (ref 19–32)
Calcium: 8.1 mg/dL — ABNORMAL LOW (ref 8.4–10.5)
Creatinine, Ser: 0.68 mg/dL (ref 0.50–1.10)
Glucose, Bld: 100 mg/dL — ABNORMAL HIGH (ref 70–99)

## 2012-01-19 LAB — LIPASE, BLOOD: Lipase: 69 U/L — ABNORMAL HIGH (ref 11–59)

## 2012-01-19 MED ORDER — LORAZEPAM 2 MG/ML IJ SOLN
1.0000 mg | Freq: Once | INTRAMUSCULAR | Status: DC
  Filled 2012-01-19: qty 1

## 2012-01-19 MED ORDER — MORPHINE SULFATE 2 MG/ML IJ SOLN
2.0000 mg | Freq: Once | INTRAMUSCULAR | Status: AC
  Administered 2012-01-19: 2 mg via INTRAMUSCULAR
  Filled 2012-01-19: qty 1

## 2012-01-19 MED ORDER — MORPHINE SULFATE 2 MG/ML IJ SOLN: 2.0000 mg | Freq: Once | INTRAMUSCULAR | Status: DC

## 2012-01-19 MED ORDER — LORAZEPAM 2 MG/ML IJ SOLN: 1.0000 mg | Freq: Once | INTRAMUSCULAR | Status: DC

## 2012-01-19 NOTE — Progress Notes (Signed)
[facility] PROGRESS NOTE  Mandy Hanson JXB:[id number] DOB: [birth year] DOA: 01/14/2012  PCP: Allean Found, MD Primary GI: Dr. Madilyn Fireman  Brief HPI: Mandy Hanson is a 62 y.o. female with history of GERD, esophagitis, and diverticulosis who presented with abdominal pain. She stated that she began having lower abdominal cramping pain 2 days prior to admission, and then she developed nausea and vomiting which has been persistent. She stated that the vomitus was nonbloody. She denied diarrhea, fevers, and no melena. She was seen in the ED and a CT scan of her abdomen and pelvis showed findings concerning for acute diverticulitis of the sigmoid colon. No definite diverticular abscess or findings to suggest frank perforation. Her white cell count was 12.5. She was given doses of antiemetics in the ED but continued to vomit, she states Zofran doesn't help-but also so far not relieved with Phenergan. She was admitted for further evaluation and management.  Past medical history:  Past Medical History  Diagnosis Date  . GERD (gastroesophageal reflux disease)   . Asthma   . Esophagitis   . Diverticulosis    Consultants: None  Procedures: None  Antibiotics: Cipro and Metronidazole  Subjective: Patient feels better compared to yesterday. No further episodes of vomiting. Pain in abdomen is much better as well.   Objective: Vital Signs  Filed Vitals:   01/18/12 0610 01/18/12 1400 01/18/12 2200 01/19/12 0540  BP: 137/75 149/76 101/62 103/66  Pulse: 70 99 81 67  Temp: 99.2 F (37.3 C) 97.9 F (36.6 C) 98.7 F (37.1 C) 97.5 F (36.4 C)  TempSrc: Oral Oral Oral Oral  Resp: 18 16 18 18   Height:      Weight:      SpO2: 100% 100% 100% 100%    Intake/Output Summary (Last 24 hours) at 01/19/12 1106 Last data filed at 01/19/12 0900  Gross per 24 hour  Intake 3173.34 ml  Output   1500 ml  Net 1673.34 ml   Filed Weights   01/14/12 2049  Weight: 76.658 kg (169 lb)     General appearance: alert, cooperative, appears stated age and no distress Head: Normocephalic, without obvious abnormality, atraumatic Resp: clear to auscultation bilaterally Cardio: regular rate and rhythm, S1, S2 normal, no murmur, click, rub or gallop GI: Soft, mildly tender in the epigastrium and LLQ. No rebound rigidity. No masses or organomegaly. Improved today. Extremities: extremities normal, atraumatic, no cyanosis or edema Neurologic: Alert and oriented X 3, normal strength and tone. Normal symmetric reflexes. Normal coordination and gait  Lab Results:  Basic Metabolic Panel:  Lab 01/19/12 1308 01/18/12 0419 01/17/12 0451 01/16/12 0410 01/15/12 0418  NA 133* 131* 130* 130* 134*  K 3.5 3.1* 3.0* 3.0* 3.4*  CL 100 95* 95* 96 99  CO2 25 25 26 24 22   GLUCOSE 100* 118* 109* 115* 115*  BUN 5* 5* 5* 6 8  CREATININE 0.68 0.61 0.54 0.53 0.61  CALCIUM 8.1* 8.2* 8.3* 8.2* 8.6  MG 2.3 2.3 -- -- --  PHOS -- -- -- -- --   Liver Function Tests:  Lab 01/14/12 1300  AST 18  ALT 13  ALKPHOS 70  BILITOT 0.8  PROT 8.2  ALBUMIN 4.2    Lab 01/19/12 0411 01/18/12 0419 01/14/12 1300  LIPASE 69* 66* 40  AMYLASE -- -- --   No results found for this basename: AMMONIA:5 in the last 168 hours CBC:  Lab 01/19/12 0411 01/18/12 0419 01/17/12 0451 01/16/12 0410 01/15/12 0418 01/14/12 1300  WBC 5.6  5.3 5.8 7.5 11.0* --  NEUTROABS -- -- -- -- -- 11.6*  HGB 12.3 14.1 13.2 12.2 12.7 --  HCT 36.2 41.1 38.2 37.0 38.2 --  MCV 93.5 93.0 92.7 94.1 94.6 --  PLT 320 358 345 318 359 --    Recent Results (from the past 240 hour(s))  URINE CULTURE     Status: Normal   Collection Time   01/14/12  3:10 PM      Component Value Range Status Comment   Specimen Description URINE, CLEAN CATCH   Final    Special Requests NONE   Final    Culture  Setup Time 01/14/2012 18:50   Final    Colony Count 15,000 COLONIES/ML   Final    Culture     Final    Value: Multiple bacterial morphotypes present,  none predominant. Suggest appropriate recollection if clinically indicated.   Report Status 01/15/2012 FINAL   Final       Studies/Results: No results found.  Medications:  Scheduled:    . antiseptic oral rinse  15 mL Mouth Rinse q12n4p  . azelastine  1 spray Each Nare BID  . chlorhexidine  15 mL Mouth Rinse BID  . ciprofloxacin  400 mg Intravenous Q12H  . docusate sodium  100 mg Oral BID  . enoxaparin (LOVENOX) injection  40 mg Subcutaneous Q24H  . fluticasone  2 spray Each Nare Daily  . lubiprostone  24 mcg Oral BID WC  . metronidazole  500 mg Intravenous Q8H  . montelukast  10 mg Oral QHS  . pantoprazole (PROTONIX) IV  40 mg Intravenous Q12H  . polyethylene glycol  17 g Oral Daily   Continuous:    . 0.9 % NaCl with KCl 20 mEq / L 100 mL/hr at 01/19/12 1610   RUE:[id number], acetaminophen, alum & mag hydroxide-simeth, hydrOXYzine, LORazepam, magic mouthwash, morphine injection, promethazine, promethazine  Assessment/Plan:  Principal Problem:  *Diverticulitis of colon (without mention of hemorrhage) Active Problems:  Hyponatremia  GERD (gastroesophageal reflux disease)  Nausea and vomiting in adult  Hypokalemia    Diverticulitis of colon (without mention of hemorrhage)  Patient feels better today. Advance to full liquids. If she continues to improve we will change to oral antibiotics tomorrow. Continue IV PPI.  Elevated Lipase Could be mild pancreatitis but more likely to be secondary to nausea and vomiting. Continue to monitor.  Nausea and Vomiting Improved. See above. Symptomatic treatment. Likely from gastric irritation.  Hyponatremia  Improving. Likely secondary to dehydration. Continue IVF.   Hypokalemia  Improved.   GERD (gastroesophageal reflux disease)  Continue Protonix BID IV   Code Status Full Code  DVT Prophylaxis Initiate Lovenox  Family Communication: Discussed with patient  Disposition Plan: Unclear but will likely return home  when better. Anticipate discharge on 1/18.    LOS: 5 days   [facility]  [facility] Pager [phone] 01/19/2012, 11:06 AM  If 8PM-8AM, please contact night-coverage at [email or web address], password [facility]

## 2012-01-20 ENCOUNTER — Inpatient Hospital Stay (HOSPITAL_COMMUNITY): Payer: Managed Care, Other (non HMO)

## 2012-01-20 DIAGNOSIS — R1013 Epigastric pain: Secondary | ICD-10-CM | POA: Diagnosis not present

## 2012-01-20 DIAGNOSIS — R7401 Elevation of levels of liver transaminase levels: Secondary | ICD-10-CM

## 2012-01-20 LAB — COMPREHENSIVE METABOLIC PANEL
ALT: 87 U/L — ABNORMAL HIGH (ref 0–35)
AST: 71 U/L — ABNORMAL HIGH (ref 0–37)
Albumin: 3.3 g/dL — ABNORMAL LOW (ref 3.5–5.2)
Calcium: 8.6 mg/dL (ref 8.4–10.5)
Chloride: 95 mEq/L — ABNORMAL LOW (ref 96–112)
Creatinine, Ser: 0.53 mg/dL (ref 0.50–1.10)
Sodium: 130 mEq/L — ABNORMAL LOW (ref 135–145)

## 2012-01-20 LAB — CBC
MCH: 31.7 pg (ref 26.0–34.0)
MCV: 92.2 fL (ref 78.0–100.0)
Platelets: 366 10*3/uL (ref 150–400)
RBC: 4.36 MIL/uL (ref 3.87–5.11)
RDW: 12.2 % (ref 11.5–15.5)
WBC: 7.6 10*3/uL (ref 4.0–10.5)

## 2012-01-20 MED ORDER — SODIUM CHLORIDE 0.9 % IJ SOLN
10.0000 mL | INTRAMUSCULAR | Status: DC | PRN
  Administered 2012-01-22 – 2012-01-31 (×6): 10 mL

## 2012-01-20 MED ORDER — SACCHAROMYCES BOULARDII 250 MG PO CAPS
250.0000 mg | ORAL_CAPSULE | Freq: Two times a day (BID) | ORAL | Status: DC
  Administered 2012-01-20 – 2012-01-31 (×16): 250 mg via ORAL
  Filled 2012-01-20 (×23): qty 1

## 2012-01-20 NOTE — Consult Note (Signed)
Referring Provider: Osvaldo Shipper Primary Care Physician:  Allean Found, MD Primary Gastroenterologist:  Dr. Dorena Cookey  Reason for Consultation:  Epigastric pain and mild elevation of liver chemistries. Nausea.  HPI: Mandy Hanson is a 62 y.o. female admitted to the hospital about a week ago because of diverticulitis of the sigmoid region, confirmed by CT. She has been treated with Cipro and Flagyl with nearly complete resolution of her left lower quadrant and lower abdominal pain, but over the past couple of days, has had epigastric burning discomfort, nausea with intermittent vomiting, and some mild rise in her transaminases.  The patient has a past history of gastritis and esophagitis detected endoscopically 3 years ago after coming down with an acute episode of nausea and vomiting while [place] the beach. A more recent episode several months ago prompted repeat endoscopic evaluation which showed no evidence of gastritis or esophagitis. The patient has been off her PPI therapy for several months now.   Since coming into the hospital, her transaminases, which were completely normal on admission, have risen to the 70-80 range. Lipase was minimally elevated at 69 but is normal today. There is no family history of biliary tract disease, or for that matter, other GI illnesses.  Her abdominal pain is in the epigastric area, without radiation to the back. It is not intense, but rather, sort of a dull, burning character, with some transient improvement with meals, but then gets worse an hour or 2 after meals.  Of note, the patient is on high-dose PPI therapy here in the hospital, making reflux unlikely.  Past Medical History  Diagnosis Date  . GERD (gastroesophageal reflux disease)   . Asthma   . Esophagitis   . Diverticulosis     Past Surgical History  Procedure Date  . Abdominal hysterectomy   . Tonsillectomy     Prior to Admission medications   Medication Sig Start Date  End Date Taking? Authorizing Provider  azelastine (ASTELIN) 137 MCG/SPRAY nasal spray Place 1 spray into the nose 2 (two) times daily. Use in each nostril as directed   Yes Historical Provider, MD  calcium carbonate (OS-CAL) 600 MG TABS Take 600 mg by mouth daily.   Yes Historical Provider, MD  lubiprostone (AMITIZA) 24 MCG capsule Take 24 mcg by mouth 2 (two) times daily with a meal.   Yes Historical Provider, MD  mometasone (NASONEX) 50 MCG/ACT nasal spray Place 2 sprays into the nose daily as needed. Allergies   Yes Historical Provider, MD  montelukast (SINGULAIR) 10 MG tablet Take 10 mg by mouth at bedtime.   Yes Historical Provider, MD  ondansetron (ZOFRAN) 4 MG tablet Take 4 mg by mouth every 8 (eight) hours as needed. nausea   Yes Historical Provider, MD    Current Facility-Administered Medications  Medication Dose Route Frequency Provider Last Rate Last Dose  . 0.9 % NaCl with KCl 20 mEq/ L  infusion   Intravenous Continuous Osvaldo Shipper, MD 100 mL/hr at 01/20/12 1138    . acetaminophen (TYLENOL) tablet 650 mg  650 mg Oral Q6H PRN Adeline C Viyuoh, MD       Or  . acetaminophen (TYLENOL) suppository 650 mg  650 mg Rectal Q6H PRN Adeline C Viyuoh, MD      . alum & mag hydroxide-simeth (MAALOX/MYLANTA) 200-200-20 MG/5ML suspension 30 mL  30 mL Oral Q4H PRN Dorothea Ogle, MD   30 mL at 01/18/12 1330  . azelastine (ASTELIN) nasal spray 1 spray  1 spray  Each Nare BID Kela Millin, MD   1 spray at 01/20/12 0806  . ciprofloxacin (CIPRO) IVPB 400 mg  400 mg Intravenous Q12H Osvaldo Shipper, MD   400 mg at 01/20/12 1534  . docusate sodium (COLACE) capsule 100 mg  100 mg Oral BID Dorothea Ogle, MD   100 mg at 01/19/12 1030  . enoxaparin (LOVENOX) injection 40 mg  40 mg Subcutaneous Q24H Osvaldo Shipper, MD   40 mg at 01/20/12 1536  . fluticasone (FLONASE) 50 MCG/ACT nasal spray 2 spray  2 spray Each Nare Daily Kela Millin, MD   2 spray at 01/20/12 0805  . hydrOXYzine (VISTARIL) injection  50 mg  50 mg Intramuscular Q6H PRN Dorothea Ogle, MD      . LORazepam (ATIVAN) injection 0.5 mg  0.5 mg Intravenous BID PRN Dorothea Ogle, MD      . LORazepam (ATIVAN) injection 1 mg  1 mg Intramuscular Once Leanne Chang, NP      . lubiprostone (AMITIZA) capsule 24 mcg  24 mcg Oral BID WC Dorothea Ogle, MD   24 mcg at 01/20/12 0755  . magic mouthwash  10 mL Oral PRN Dorothea Ogle, MD      . montelukast (SINGULAIR) tablet 10 mg  10 mg Oral QHS Kela Millin, MD   10 mg at 01/18/12 2244  . morphine 2 MG/ML injection 1-2 mg  1-2 mg Intravenous Q3H PRN Kela Millin, MD   2 mg at 01/20/12 1138  . pantoprazole (PROTONIX) injection 40 mg  40 mg Intravenous Q12H Osvaldo Shipper, MD   40 mg at 01/19/12 1029  . polyethylene glycol (MIRALAX / GLYCOLAX) packet 17 g  17 g Oral Daily Dorothea Ogle, MD   17 g at 01/19/12 1030  . promethazine (PHENERGAN) injection 25 mg  25 mg Intravenous Q3H PRN Dorothea Ogle, MD   25 mg at 01/20/12 1138  . promethazine (PHENERGAN) suppository 25 mg  25 mg Rectal Q6H PRN Dorothea Ogle, MD   25 mg at 01/20/12 0531  . saccharomyces boulardii (FLORASTOR) capsule 250 mg  250 mg Oral BID [name] Fitch Brianny Soulliere, MD      . sodium chloride 0.9 % injection 10-40 mL  10-40 mL Intracatheter PRN Leanne Chang, NP        Allergies as of 01/14/2012  . (No Known Allergies)    History reviewed. No pertinent family history.  History   Social History  . Marital Status: Married    Spouse Name: N/A    Number of Children: N/A  . Years of Education: N/A   Occupational History  .  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 Smoking status: Never Smoker   . Smokeless tobacco: Not on file  . Alcohol Use: No  . Drug Use: No  . Sexually Active:    Other Topics Concern  . Not on file   Social History Narrative  . No narrative on file    Review of systems: See history of present illness  Physical Exam: Vital signs in last 24 hours: Temp:   [98 F (36.7 C)-98.2 F (36.8 C)] 98 F (36.7 C) (01/17 1520) Pulse Rate:  [72-88] 72  (01/17 1520) Resp:  [17-18] 18  (01/17 0532) BP: (107-147)/(55-73) 108/63 mmHg (01/17 1520) SpO2:  [98 %-100 %] 98 % (01/17 1520) Last BM Date: 01/20/12 General:   Alert,  Well-developed, well-nourished, pleasant  and cooperative in NAD Head:  Normocephalic and atraumatic. Eyes:  Sclera clear, no icterus.   Conjunctiva pink. Mouth:   No ulcerations or lesions.  Oropharynx pink & moist. Neck:   No masses or thyromegaly. Lungs:  Clear throughout to auscultation.   No wheezes, crackles, or rhonchi. No evident respiratory distress. Heart:   Regular rate and rhythm; no murmurs, clicks, rubs,  or gallops. Abdomen:  Soft, nontender, nontympanitic, and nondistended. No masses, hepatosplenomegaly or ventral hernias noted. Normal bowel sounds, without bruits, guarding, or rebound.   Msk:   Symmetrical without gross deformities. Extremities:   Without clubbing, cyanosis, or edema. Neurologic:  Alert and coherent;  grossly normal neurologically. Skin:  Intact without significant lesions or rashes. Cervical Nodes:  No significant cervical adenopathy. Psych:   Alert and cooperative. Normal mood and affect.  Intake/Output from previous day: 01/16 0701 - 01/17 0700 In: 2500 [P.O.:600; I.V.:1200; IV Piggyback:700] Out: 1500 [Urine:1500] Intake/Output this shift: Total I/O In: 1040 [P.O.:240; I.V.:800] Out: 0   Lab Results:  Basename 01/20/12 0410 01/19/12 0411 01/18/12 0419  WBC 7.6 5.6 5.3  HGB 13.8 12.3 14.1  HCT 40.2 36.2 41.1  PLT 366 320 358   BMET  Basename 01/20/12 0410 01/19/12 0411 01/18/12 0419  NA 130* 133* 131*  K 3.5 3.5 3.1*  CL 95* 100 95*  CO2 23 25 25   GLUCOSE 105* 100* 118*  BUN 5* 5* 5*  CREATININE 0.53 0.68 0.61  CALCIUM 8.6 8.1* 8.2*   LFT  Basename 01/20/12 0410  PROT 7.0  ALBUMIN 3.3*  AST 71*  ALT 87*  ALKPHOS 52  BILITOT 0.4  BILIDIR --  IBILI --   PT/INR No  results found for this basename: LABPROT:2,INR:2 in the last 72 hours   Studies/Results: No results found.  Impression: I think that the most likely explanation is a drug reaction, most likely to her metronidazole, which is famous for causing nausea and vomiting. The elevated liver chemistries are most likely a drug reaction or reactive hepatopathy, but could conceivably be the consequence of biliary tract disease  Plan: 1. Stop metronidazole. She probably does not need it anymore in view of the resolution of her abdominal pain and, for the most part, tenderness 2. Probiotic therapy while in house to help prevent diarrhea and C. difficile colitis 3. Go back to clear liquid diet until nausea and vomiting are improved 4. Abdominal ultrasound to help exclude biliary tract disease   LOS: 6 days   [place] V  01/20/2012, 6:11 PM

## 2012-01-20 NOTE — Progress Notes (Signed)
Patient is wanting to wait for PICC placement until she sees her physician today.  States she is feeling much better and is tolerating food.  Informed her staff RN and will wait to hear from her re: PICC placement

## 2012-01-20 NOTE — Progress Notes (Signed)
Peripherally Inserted Central Catheter/Midline Placement  The IV Nurse has discussed with the patient and/or persons authorized to consent for the patient, the purpose of this procedure and the potential benefits and risks involved with this procedure.  The benefits include less needle sticks, lab draws from the catheter and patient may be discharged home with the catheter.  Risks include, but not limited to, infection, bleeding, blood clot (thrombus formation), and puncture of an artery; nerve damage and irregular heat beat.  Alternatives to this procedure were also discussed.  PICC/Midline Placement Documentation        Mandy Hanson 01/20/2012, 11:36 AM

## 2012-01-20 NOTE — Progress Notes (Signed)
[facility] PROGRESS NOTE  [name]:[id number] DOB: December 02, 1950 DOA: 01/14/2012  PCP: Allean Found, MD Primary GI: Dr. Madilyn Fireman  Brief HPI: Mandy Hanson is a 62 y.o. female with history of GERD, esophagitis, and diverticulosis who presented with abdominal pain. She stated that she began having lower abdominal cramping pain 2 days prior to admission, and then she developed nausea and vomiting which has been persistent. She stated that the vomitus was nonbloody. She denied diarrhea, fevers, and no melena. She was seen in the ED and a CT scan of her abdomen and pelvis showed findings concerning for acute diverticulitis of the sigmoid colon. No definite diverticular abscess or findings to suggest frank perforation. Her white cell count was 12.5. She was given doses of antiemetics in the ED but continued to vomit, she states Zofran doesn't help-but also so far not relieved with Phenergan. She was admitted for further evaluation and management.  Past medical history:  Past Medical History  Diagnosis Date  . GERD (gastroesophageal reflux disease)   . Asthma   . Esophagitis   . Diverticulosis    Consultants: None  Procedures: None  Antibiotics: Cipro and Metronidazole  Subjective: Patient vomited last night and this morning. Could not tolerate full liquids. Pain in upper abdomen persists. No pain in lower abdomen.   Objective: Vital Signs  Filed Vitals:   01/19/12 1355 01/19/12 1431 01/19/12 2151 01/20/12 0532  BP: 90/50 94/60 147/73 107/55  Pulse: 68  85 88  Temp: 98.5 F (36.9 C)  98.2 F (36.8 C) 98 F (36.7 C)  TempSrc: Oral  Oral Oral  Resp: 18  17 18   Height:      Weight:      SpO2: 100%  100% 98%    Intake/Output Summary (Last 24 hours) at 01/20/12 1223 Last data filed at 01/20/12 1000  Gross per 24 hour  Intake   1860 ml  Output   1500 ml  Net    360 ml   Filed Weights   01/14/12 2049  Weight: 76.658 kg (169 lb)    General appearance:  alert, cooperative, appears stated age and no distress Head: Normocephalic, without obvious abnormality, atraumatic Resp: clear to auscultation bilaterally Cardio: regular rate and rhythm, S1, S2 normal, no murmur, click, rub or gallop GI: Soft, tender in the epigastrium without rebound or rigidity. Mildly tender in LLQ. No rebound rigidity. No masses or organomegaly.  Extremities: extremities normal, atraumatic, no cyanosis or edema Neurologic: Alert and oriented X 3, normal strength and tone.  Lab Results:  Basic Metabolic Panel:  Lab 01/20/12 8295 01/19/12 0411 01/18/12 0419 01/17/12 0451 01/16/12 0410  NA 130* 133* 131* 130* 130*  K 3.5 3.5 3.1* 3.0* 3.0*  CL 95* 100 95* 95* 96  CO2 23 25 25 26 24   GLUCOSE 105* 100* 118* 109* 115*  BUN 5* 5* 5* 5* 6  CREATININE 0.53 0.68 0.61 0.54 0.53  CALCIUM 8.6 8.1* 8.2* 8.3* 8.2*  MG -- 2.3 2.3 -- --  PHOS -- -- -- -- --   Liver Function Tests:  Lab 01/20/12 0410 01/14/12 1300  AST 71* 18  ALT 87* 13  ALKPHOS 52 70  BILITOT 0.4 0.8  PROT 7.0 8.2  ALBUMIN 3.3* 4.2    Lab 01/20/12 0410 01/19/12 0411 01/18/12 0419 01/14/12 1300  LIPASE 44 69* 66* 40  AMYLASE -- -- -- --   CBC:  Lab 01/20/12 0410 01/19/12 0411 01/18/12 0419 01/17/12 0451 01/16/12 0410 01/14/12 1300  WBC 7.6 5.6 5.3 5.8 7.5 --  NEUTROABS -- -- -- -- -- 11.6*  HGB 13.8 12.3 14.1 13.2 12.2 --  HCT 40.2 36.2 41.1 38.2 37.0 --  MCV 92.2 93.5 93.0 92.7 94.1 --  PLT 366 320 358 345 318 --    Recent Results (from the past 240 hour(s))  URINE CULTURE     Status: Normal   Collection Time   01/14/12  3:10 PM      Component Value Range Status Comment   Specimen Description URINE, CLEAN CATCH   Final    Special Requests NONE   Final    Culture  Setup Time 01/14/2012 18:50   Final    Colony Count 15,000 COLONIES/ML   Final    Culture     Final    Value: Multiple bacterial morphotypes present, none predominant. Suggest appropriate recollection if clinically indicated.    Report Status 01/15/2012 FINAL   Final       Studies/Results: No results found.  Medications:  Scheduled:    . azelastine  1 spray Each Nare BID  . ciprofloxacin  400 mg Intravenous Q12H  . docusate sodium  100 mg Oral BID  . enoxaparin (LOVENOX) injection  40 mg Subcutaneous Q24H  . fluticasone  2 spray Each Nare Daily  . LORazepam  1 mg Intramuscular Once  . lubiprostone  24 mcg Oral BID WC  . metronidazole  500 mg Intravenous Q8H  . montelukast  10 mg Oral QHS  . pantoprazole (PROTONIX) IV  40 mg Intravenous Q12H  . polyethylene glycol  17 g Oral Daily   Continuous:    . 0.9 % NaCl with KCl 20 mEq / L 100 mL/hr at 01/20/12 1138   NWG:[id number], acetaminophen, alum & mag hydroxide-simeth, hydrOXYzine, LORazepam, magic mouthwash, morphine injection, promethazine, promethazine, sodium chloride  Assessment/Plan:  Principal Problem:  *Diverticulitis of colon (without mention of hemorrhage) Active Problems:  Hyponatremia  GERD (gastroesophageal reflux disease)  Nausea and vomiting in adult  Hypokalemia    Epigastric Abdominal Pain with Nausea and Vomiting Transaminitis noted which is new. Lipase is normal today. I believe she may have a new process, Gastritis, PUD, Cholelithiasis. Since her symptoms are not improving will go ahead and consult GI. Discussed with Dr. Matthias Hughs who recommends an [place]. He will see her later today. Continue PPI.  Diverticulitis of colon (without mention of hemorrhage)  See above. This aspect may be stable. Continue antibiotics IV. Will wait for GI input.   Elevated Lipase Could be mild pancreatitis but more likely to be secondary to nausea and vomiting. Normal today.   Hyponatremia  Likely secondary to dehydration. Continue IVF.   Hypokalemia  Improved. Stable  GERD (gastroesophageal reflux disease)  Continue Protonix BID IV   Code Status Full Code  DVT Prophylaxis Lovenox  Family Communication: Discussed with patient    Disposition Plan: Unclear but will likely return home when better.    LOS: 6 days   [facility]  [facility] Pager [phone] 01/20/2012, 12:23 PM  If 8PM-8AM, please contact night-coverage at [email or web address], password [facility]

## 2012-01-21 DIAGNOSIS — R112 Nausea with vomiting, unspecified: Secondary | ICD-10-CM

## 2012-01-21 LAB — CBC
HCT: 37.9 % (ref 36.0–46.0)
MCH: 31.7 pg (ref 26.0–34.0)
MCV: 93.1 fL (ref 78.0–100.0)
RBC: 4.07 MIL/uL (ref 3.87–5.11)
WBC: 9.1 10*3/uL (ref 4.0–10.5)

## 2012-01-21 LAB — COMPREHENSIVE METABOLIC PANEL
BUN: 6 mg/dL (ref 6–23)
CO2: 25 mEq/L (ref 19–32)
Chloride: 96 mEq/L (ref 96–112)
Creatinine, Ser: 0.61 mg/dL (ref 0.50–1.10)
GFR calc Af Amer: >90 mL/min (ref >90)
GFR calc non Af Amer: >90 mL/min (ref >90)
Glucose, Bld: 118 mg/dL — ABNORMAL HIGH (ref 70–99)
Total Bilirubin: 0.4 mg/dL (ref 0.3–1.2)

## 2012-01-21 LAB — LIPASE, BLOOD: Lipase: 62 U/L — ABNORMAL HIGH (ref 11–59)

## 2012-01-21 LAB — MAGNESIUM: Magnesium: 2 mg/dL (ref 1.5–2.5)

## 2012-01-21 MED ORDER — POTASSIUM CHLORIDE 10 MEQ/100ML IV SOLN
10.0000 meq | INTRAVENOUS | Status: AC
  Administered 2012-01-21 (×4): 10 meq via INTRAVENOUS
  Filled 2012-01-21 (×4): qty 100

## 2012-01-21 MED ORDER — METOCLOPRAMIDE HCL 5 MG/5ML PO SOLN
5.0000 mg | Freq: Three times a day (TID) | ORAL | Status: DC
  Administered 2012-01-21 – 2012-01-23 (×6): 5 mg via ORAL
  Filled 2012-01-21 (×14): qty 5

## 2012-01-21 NOTE — Progress Notes (Signed)
[facility] Progress Note  Subjective: Patient still having intermittent vomiting described as bilious despite being back down to a clear liquid diet. Her abdominal pain has essentially resolved.  Objective: Vital signs in last 24 hours: Temp:  [98 F (36.7 C)-98.3 F (36.8 C)] 98.1 F (36.7 C) (01/18 0711) Pulse Rate:  [61-93] 93  (01/18 0711) Resp:  [18] 18  (01/18 0711) BP: (100-152)/(63-80) 152/80 mmHg (01/18 0711) SpO2:  [97 %-98 %] 98 % (01/18 0711) Weight change:    PE: Abdomen soft nondistended nontender with normoactive bowel sounds. There is mild left lower quadrant tenderness.  Lab Results: Results for orders placed during the hospital encounter of 01/14/12 (from the past 24 hour(s))  CBC     Status: Normal   Collection Time   01/21/12  3:40 AM      Component Value Range   WBC 9.1  4.0 - 10.5 K/uL   RBC 4.07  3.87 - 5.11 MIL/uL   Hemoglobin 12.9  12.0 - 15.0 g/dL   HCT 09.8  11.9 - 14.7 %   MCV 93.1  78.0 - 100.0 fL   MCH 31.7  26.0 - 34.0 pg   MCHC 34.0  30.0 - 36.0 g/dL   RDW 82.9  56.2 - 13.0 %   Platelets 356  150 - 400 K/uL  COMPREHENSIVE METABOLIC PANEL     Status: Abnormal   Collection Time   01/21/12  3:40 AM      Component Value Range   Sodium 131 (*) 135 - 145 mEq/L   Potassium 3.0 (*) 3.5 - 5.1 mEq/L   Chloride 96  96 - 112 mEq/L   CO2 25  19 - 32 mEq/L   Glucose, Bld 118 (*) 70 - 99 mg/dL   BUN 6  6 - 23 mg/dL   Creatinine, Ser 8.65  0.50 - 1.10 mg/dL   Calcium 8.4  8.4 - 78.4 mg/dL   Total Protein 6.8  6.0 - 8.3 g/dL   Albumin 3.5  3.5 - 5.2 g/dL   AST 696 (*) 0 - 37 U/L   ALT 131 (*) 0 - 35 U/L   Alkaline Phosphatase 48  39 - 117 U/L   Total Bilirubin 0.4  0.3 - 1.2 mg/dL   GFR calc non Af Amer >90  >90 mL/min   GFR calc Af Amer >90  >90 mL/min  LIPASE, BLOOD     Status: Abnormal   Collection Time   01/21/12  3:40 AM      Component Value Range   Lipase 62 (*) 11 - 59 U/L  MAGNESIUM     Status: Normal   Collection Time   01/21/12  3:40 AM      Component Value Range   Magnesium 2.0  1.5 - 2.5 mg/dL    Studies/Results: US Abdomen Complete  01/21/2012  *RADIOLOGY REPORT*  Clinical Data:  Abnormal liver function studies.  COMPLETE ABDOMINAL ULTRASOUND  Comparison:  CT abdomen and pelvis 01/14/2012.  Findings:  Gallbladder:  Mild layering sludge in the gallbladder.  No evidence of gallstones, gallbladder wall thickening, pericholecystic edema, or gallbladder distension.  Common bile duct:  Normal caliber with measured diameter of 3 mm.  Liver:  No focal lesion identified.  Within normal limits in parenchymal echogenicity.  IVC:  Appears normal.  Pancreas:  No focal abnormality seen.  Spleen:  Spleen length measures 9.1 cm.  Normal parenchymal echotexture.  Right Kidney:  Right kidney measures 12.3 cm length.  No hydronephrosis.  Left Kidney:  Left kidney measures 11.3 cm length.  No hydronephrosis.  Abdominal aorta:  No aneurysm identified.  IMPRESSION: Mild gallbladder sludge.  Examination is otherwise unremarkable.   Original Report Authenticated By: Burman Nieves, M.D.       Assessment: 1. Diverticulitis, improving with Cipro and Flagyl discontinued 2. Nausea and vomiting persisting beyond clinical improvement of diverticulitis, etiology not clear at the moment 3. Elevated liver function test without any obvious biliary cause by CT and ultrasound although there is some sludge on ultrasound in the gallbladder  Plan: 1. Try full liquid diet 2. Will try a course of low-dose Reglan 3. We'll check LFTs tomorrow before doing any serologic specific workup 4.     Asako Saliba C 01/21/2012, 11:01 AM

## 2012-01-21 NOTE — Progress Notes (Signed)
[facility] PROGRESS NOTE  Mandy Hanson ZOX:[id number] DOB: 1950-09-13 DOA: 01/14/2012  PCP: Allean Found, MD Primary GI: Dr. Madilyn Fireman  Brief HPI: Mandy Hanson is a 62 y.o. female with history of GERD, esophagitis, and diverticulosis who presented with abdominal pain. She stated that she began having lower abdominal cramping pain 2 days prior to admission, and then she developed nausea and vomiting which has been persistent. She stated that the vomitus was nonbloody. She denied diarrhea, fevers, and no melena. She was seen in the ED and a CT scan of her abdomen and pelvis showed findings concerning for acute diverticulitis of the sigmoid colon. No definite diverticular abscess or findings to suggest frank perforation. Her white cell count was 12.5. She was given doses of antiemetics in the ED but continued to vomit, she states Zofran doesn't help-but also so far not relieved with Phenergan. She was admitted for further evaluation and management.  Past medical history:  Past Medical History  Diagnosis Date  . GERD (gastroesophageal reflux disease)   . Asthma   . Esophagitis   . Diverticulosis    Consultants: None  Procedures: None  Antibiotics: Cipro 1/11--> Metronidazole 1/11-->1/17  Subjective: Patient had one episode of vomiting yesterday. Pain in lower abdomen is almost resolved. Pain persists in upper abdomen. Do other complaints.  Objective: Vital Signs  Filed Vitals:   01/20/12 0532 01/20/12 1520 01/20/12 2112 01/21/12 0711  BP: 107/55 108/63 100/64 152/80  Pulse: 88 72 61 93  Temp: 98 F (36.7 C) 98 F (36.7 C) 98.3 F (36.8 C) 98.1 F (36.7 C)  TempSrc: Oral Oral Oral Oral  Resp: 18  18 18   Height:      Weight:      SpO2: 98% 98% 97% 98%    Intake/Output Summary (Last 24 hours) at 01/21/12 0756 Last data filed at 01/21/12 8119  Gross per 24 hour  Intake   2240 ml  Output   2500 ml  Net   -260 ml   Filed Weights   01/14/12 2049  Weight:  76.658 kg (169 lb)    General appearance: alert, cooperative, appears stated age and no distress Head: Normocephalic, without obvious abnormality, atraumatic Resp: clear to auscultation bilaterally Cardio: regular rate and rhythm, S1, S2 normal, no murmur, click, rub or gallop GI: Soft, tender in the epigastrium without rebound or rigidity. Mildly tender in LLQ. No rebound rigidity. No tenderness in RUQ. No masses or organomegaly.  Extremities: extremities normal, atraumatic, no cyanosis or edema Neurologic: Alert and oriented X 3, normal strength and tone.  Lab Results:  Basic Metabolic Panel:  Lab 01/21/12 1478 01/20/12 0410 01/19/12 0411 01/18/12 0419 01/17/12 0451  NA 131* 130* 133* 131* 130*  K 3.0* 3.5 3.5 3.1* 3.0*  CL 96 95* 100 95* 95*  CO2 25 23 25 25 26   GLUCOSE 118* 105* 100* 118* 109*  BUN 6 5* 5* 5* 5*  CREATININE 0.61 0.53 0.68 0.61 0.54  CALCIUM 8.4 8.6 8.1* 8.2* 8.3*  MG -- -- 2.3 2.3 --  PHOS -- -- -- -- --   Liver Function Tests:  Lab 01/21/12 0340 01/20/12 0410 01/14/12 1300  AST 135* 71* 18  ALT 131* 87* 13  ALKPHOS 48 52 70  BILITOT 0.4 0.4 0.8  PROT 6.8 7.0 8.2  ALBUMIN 3.5 3.3* 4.2    Lab 01/21/12 0340 01/20/12 0410 01/19/12 0411 01/18/12 0419 01/14/12 1300  LIPASE 62* 44 69* 66* 40  AMYLASE -- -- -- -- --  CBC:  Lab 01/21/12 0340 01/20/12 0410 01/19/12 0411 01/18/12 0419 01/17/12 0451 01/14/12 1300  WBC 9.1 7.6 5.6 5.3 5.8 --  NEUTROABS -- -- -- -- -- 11.6*  HGB 12.9 13.8 12.3 14.1 13.2 --  HCT 37.9 40.2 36.2 41.1 38.2 --  MCV 93.1 92.2 93.5 93.0 92.7 --  PLT 356 366 320 358 345 --    Recent Results (from the past 240 hour(s))  URINE CULTURE     Status: Normal   Collection Time   01/14/12  3:10 PM      Component Value Range Status Comment   Specimen Description URINE, CLEAN CATCH   Final    Special Requests NONE   Final    Culture  Setup Time 01/14/2012 18:50   Final    Colony Count 15,000 COLONIES/ML   Final    Culture     Final     Value: Multiple bacterial morphotypes present, none predominant. Suggest appropriate recollection if clinically indicated.   Report Status 01/15/2012 FINAL   Final       Studies/Results: US Abdomen Complete  01/21/2012  *RADIOLOGY REPORT*  Clinical Data:  Abnormal liver function studies.  COMPLETE ABDOMINAL ULTRASOUND  Comparison:  CT abdomen and pelvis 01/14/2012.  Findings:  Gallbladder:  Mild layering sludge in the gallbladder.  No evidence of gallstones, gallbladder wall thickening, pericholecystic edema, or gallbladder distension.  Common bile duct:  Normal caliber with measured diameter of 3 mm.  Liver:  No focal lesion identified.  Within normal limits in parenchymal echogenicity.  IVC:  Appears normal.  Pancreas:  No focal abnormality seen.  Spleen:  Spleen length measures 9.1 cm.  Normal parenchymal echotexture.  Right Kidney:  Right kidney measures 12.3 cm length.  No hydronephrosis.  Left Kidney:  Left kidney measures 11.3 cm length.  No hydronephrosis.  Abdominal aorta:  No aneurysm identified.  IMPRESSION: Mild gallbladder sludge.  Examination is otherwise unremarkable.   Original Report Authenticated By: Burman Nieves, M.D.     Medications:  Scheduled:    . azelastine  1 spray Each Nare BID  . ciprofloxacin  400 mg Intravenous Q12H  . docusate sodium  100 mg Oral BID  . enoxaparin (LOVENOX) injection  40 mg Subcutaneous Q24H  . fluticasone  2 spray Each Nare Daily  . LORazepam  1 mg Intramuscular Once  . lubiprostone  24 mcg Oral BID WC  . montelukast  10 mg Oral QHS  . pantoprazole (PROTONIX) IV  40 mg Intravenous Q12H  . polyethylene glycol  17 g Oral Daily  . saccharomyces boulardii  250 mg Oral BID   Continuous:    . 0.9 % NaCl with KCl 20 mEq / L 100 mL (01/20/12 2209)   ZOX:[id number], acetaminophen, alum & mag hydroxide-simeth, hydrOXYzine, LORazepam, magic mouthwash, morphine injection, promethazine, promethazine, sodium  chloride  Assessment/Plan:  Principal Problem:  *Diverticulitis of colon (without mention of hemorrhage) Active Problems:  Hyponatremia  GERD (gastroesophageal reflux disease)  Nausea and vomiting in adult  Hypokalemia  Transaminitis  Epigastric abdominal pain    Transaminitis with Epigastric Abdominal Pain with Nausea and Vomiting LFT's continue to rise. Lipase is mildly elevated today. Etiology remains uncertain though GI feels this may be related to medications. [place] did not show gallstones. Sludge was seen. CBD was normal size. Continue PPI. Await further GI input. Leave on clear liquids for now.  Diverticulitis of colon (without mention of hemorrhage)  See above. This aspect may be improved. Metronidazole was stopped. On IV  Cipro.   Elevated Lipase More likely secondary to nausea and vomiting.   Hyponatremia  Likely secondary to dehydration. Continue IVF.   Hypokalemia  Replete IV. Mag was normal 2 days ago. Will recheck.   History of GERD (gastroesophageal reflux disease)  Continue Protonix BID IV   Code Status Full Code  DVT Prophylaxis Lovenox  Family Communication: Discussed with patient  Disposition Plan: Will likely return home when better.    LOS: 7 days   [facility]  [facility] Pager [phone] 01/21/2012, 7:56 AM  If 8PM-8AM, please contact night-coverage at [email or web address], password [facility]

## 2012-01-22 LAB — COMPREHENSIVE METABOLIC PANEL
AST: 110 U/L — ABNORMAL HIGH (ref 0–37)
Albumin: 3.3 g/dL — ABNORMAL LOW (ref 3.5–5.2)
CO2: 24 mEq/L (ref 19–32)
Calcium: 8.4 mg/dL (ref 8.4–10.5)
Creatinine, Ser: 0.58 mg/dL (ref 0.50–1.10)
GFR calc non Af Amer: >90 mL/min (ref >90)
Total Protein: 6.6 g/dL (ref 6.0–8.3)

## 2012-01-22 MED ORDER — SODIUM CHLORIDE 0.9 % IV BOLUS (SEPSIS)
500.0000 mL | Freq: Once | INTRAVENOUS | Status: AC
  Administered 2012-01-22: 500 mL via INTRAVENOUS

## 2012-01-22 MED ORDER — POTASSIUM CHLORIDE 10 MEQ/100ML IV SOLN
10.0000 meq | INTRAVENOUS | Status: AC
  Administered 2012-01-22 (×4): 10 meq via INTRAVENOUS
  Filled 2012-01-22 (×4): qty 100

## 2012-01-22 NOTE — Progress Notes (Signed)
[facility] Progress Note  Subjective: Nausea somewhat better today tolerated a cracker and some yogurt, no abdominal pain  Objective: Vital signs in last 24 hours: Temp:  [98.5 F (36.9 C)-99 F (37.2 C)] 99 F (37.2 C) (01/19 0540) Pulse Rate:  [78-85] 85  (01/19 0540) Resp:  [16-17] 16  (01/19 0540) BP: (86-139)/(55-79) 139/79 mmHg (01/19 0540) SpO2:  [98 %-100 %] 100 % (01/19 0540) Weight change:    PE: Abdomen soft nondistended with active bowel sounds  Lab Results: Results for orders placed during the hospital encounter of 01/14/12 (from the past 24 hour(s))  COMPREHENSIVE METABOLIC PANEL     Status: Abnormal   Collection Time   01/22/12  3:00 AM      Component Value Range   Sodium 126 (*) 135 - 145 mEq/L   Potassium 3.5  3.5 - 5.1 mEq/L   Chloride 92 (*) 96 - 112 mEq/L   CO2 24  19 - 32 mEq/L   Glucose, Bld 108 (*) 70 - 99 mg/dL   BUN 5 (*) 6 - 23 mg/dL   Creatinine, Ser 8.29  0.50 - 1.10 mg/dL   Calcium 8.4  8.4 - 56.2 mg/dL   Total Protein 6.6  6.0 - 8.3 g/dL   Albumin 3.3 (*) 3.5 - 5.2 g/dL   AST 130 (*) 0 - 37 U/L   ALT 133 (*) 0 - 35 U/L   Alkaline Phosphatase 48  39 - 117 U/L   Total Bilirubin 0.4  0.3 - 1.2 mg/dL   GFR calc non Af Amer >90  >90 mL/min   GFR calc Af Amer >90  >90 mL/min    Studies/Results: US Abdomen Complete  01/21/2012  *RADIOLOGY REPORT*  Clinical Data:  Abnormal liver function studies.  COMPLETE ABDOMINAL ULTRASOUND  Comparison:  CT abdomen and pelvis 01/14/2012.  Findings:  Gallbladder:  Mild layering sludge in the gallbladder.  No evidence of gallstones, gallbladder wall thickening, pericholecystic edema, or gallbladder distension.  Common bile duct:  Normal caliber with measured diameter of 3 mm.  Liver:  No focal lesion identified.  Within normal limits in parenchymal echogenicity.  IVC:  Appears normal.  Pancreas:  No focal abnormality seen.  Spleen:  Spleen length measures 9.1 cm.  Normal parenchymal echotexture.  Right  Kidney:  Right kidney measures 12.3 cm length.  No hydronephrosis.  Left Kidney:  Left kidney measures 11.3 cm length.  No hydronephrosis.  Abdominal aorta:  No aneurysm identified.  IMPRESSION: Mild gallbladder sludge.  Examination is otherwise unremarkable.   Original Report Authenticated By: Burman Nieves, M.D.       Assessment: 1. Diverticulitis, resolving 2. Nausea and vomiting possibly improving 1. Slightly elevated liver function tests with normal appearing ultrasound and CT regarding the liver, liver function tests normal on admission  Plan: 1. Continue conservative approach for now 2. Check LFTs in the morning 3. Continue low dose Reglan 4. Advance diet as tolerated.    Adir Schicker C 01/22/2012, 11:54 AM

## 2012-01-22 NOTE — Progress Notes (Signed)
[facility] PROGRESS NOTE  Mandy Hanson ZOX:[id number] DOB: 1950-11-06 DOA: 01/14/2012  PCP: Allean Found, MD Primary GI: Dr. Madilyn Fireman  Brief HPI: Mandy Hanson is a 62 y.o. female with history of GERD, esophagitis, and diverticulosis who presented with abdominal pain. She stated that she began having lower abdominal cramping pain 2 days prior to admission, and then she developed nausea and vomiting which has been persistent. She stated that the vomitus was nonbloody. She denied diarrhea, fevers, and no melena. She was seen in the ED and a CT scan of her abdomen and pelvis showed findings concerning for acute diverticulitis of the sigmoid colon. No definite diverticular abscess or findings to suggest frank perforation. Her white cell count was 12.5. She was given doses of antiemetics in the ED but continued to vomit, she states Zofran doesn't help-but also so far not relieved with Phenergan. She was admitted for further evaluation and management.  Past medical history:  Past Medical History  Diagnosis Date  . GERD (gastroesophageal reflux disease)   . Asthma   . Esophagitis   . Diverticulosis    Consultants: None  Procedures: None  Antibiotics: Cipro 1/11--> Metronidazole 1/11-->1/17  Subjective: Patient had two episodes of vomiting yesterday. Upper abdominal pain is better but persists. No other complaints.  Objective: Vital Signs  Filed Vitals:   01/21/12 1632 01/21/12 1643 01/21/12 2152 01/22/12 0540  BP: 86/55 102/60 124/76 139/79  Pulse: 80 82 78 85  Temp: 98.5 F (36.9 C)  98.5 F (36.9 C) 99 F (37.2 C)  TempSrc: Oral  Oral Oral  Resp: 17  16 16   Height:      Weight:      SpO2: 99%  98% 100%    Intake/Output Summary (Last 24 hours) at 01/22/12 1032 Last data filed at 01/22/12 1000  Gross per 24 hour  Intake   4705 ml  Output   3300 ml  Net   1405 ml   Filed Weights   01/14/12 2049  Weight: 76.658 kg (169 lb)    General appearance: alert,  cooperative, appears stated age and no distress Head: Normocephalic, without obvious abnormality, atraumatic Resp: clear to auscultation bilaterally Cardio: regular rate and rhythm, S1, S2 normal, no murmur, click, rub or gallop GI: Soft, tender in the epigastrium without rebound or rigidity. Mildly tender in LLQ. No rebound rigidity. No tenderness in RUQ. No masses or organomegaly.  Extremities: extremities normal, atraumatic, no cyanosis or edema Neurologic: Alert and oriented X 3, normal strength and tone.  Lab Results:  Basic Metabolic Panel:  Lab 01/22/12 8119 01/21/12 0340 01/20/12 0410 01/19/12 0411 01/18/12 0419  NA 126* 131* 130* 133* 131*  K 3.5 3.0* 3.5 3.5 3.1*  CL 92* 96 95* 100 95*  CO2 24 25 23 25 25   GLUCOSE 108* 118* 105* 100* 118*  BUN 5* 6 5* 5* 5*  CREATININE 0.58 0.61 0.53 0.68 0.61  CALCIUM 8.4 8.4 8.6 8.1* 8.2*  MG -- 2.0 -- 2.3 2.3  PHOS -- -- -- -- --   Liver Function Tests:  Lab 01/22/12 0300 01/21/12 0340 01/20/12 0410  AST 110* 135* 71*  ALT 133* 131* 87*  ALKPHOS 48 48 52  BILITOT 0.4 0.4 0.4  PROT 6.6 6.8 7.0  ALBUMIN 3.3* 3.5 3.3*    Lab 01/21/12 0340 01/20/12 0410 01/19/12 0411 01/18/12 0419  LIPASE 62* 44 69* 66*  AMYLASE -- -- -- --   CBC:  Lab 01/21/12 0340 01/20/12 0410 01/19/12 0411 01/18/12  0419 01/17/12 0451  WBC 9.1 7.6 5.6 5.3 5.8  NEUTROABS -- -- -- -- --  HGB 12.9 13.8 12.3 14.1 13.2  HCT 37.9 40.2 36.2 41.1 38.2  MCV 93.1 92.2 93.5 93.0 92.7  PLT 356 366 320 358 345    Recent Results (from the past 240 hour(s))  URINE CULTURE     Status: Normal   Collection Time   01/14/12  3:10 PM      Component Value Range Status Comment   Specimen Description URINE, CLEAN CATCH   Final    Special Requests NONE   Final    Culture  Setup Time 01/14/2012 18:50   Final    Colony Count 15,000 COLONIES/ML   Final    Culture     Final    Value: Multiple bacterial morphotypes present, none predominant. Suggest appropriate recollection  if clinically indicated.   Report Status 01/15/2012 FINAL   Final       Studies/Results: US Abdomen Complete  01/21/2012  *RADIOLOGY REPORT*  Clinical Data:  Abnormal liver function studies.  COMPLETE ABDOMINAL ULTRASOUND  Comparison:  CT abdomen and pelvis 01/14/2012.  Findings:  Gallbladder:  Mild layering sludge in the gallbladder.  No evidence of gallstones, gallbladder wall thickening, pericholecystic edema, or gallbladder distension.  Common bile duct:  Normal caliber with measured diameter of 3 mm.  Liver:  No focal lesion identified.  Within normal limits in parenchymal echogenicity.  IVC:  Appears normal.  Pancreas:  No focal abnormality seen.  Spleen:  Spleen length measures 9.1 cm.  Normal parenchymal echotexture.  Right Kidney:  Right kidney measures 12.3 cm length.  No hydronephrosis.  Left Kidney:  Left kidney measures 11.3 cm length.  No hydronephrosis.  Abdominal aorta:  No aneurysm identified.  IMPRESSION: Mild gallbladder sludge.  Examination is otherwise unremarkable.   Original Report Authenticated By: Burman Nieves, M.D.     Medications:  Scheduled:    . azelastine  1 spray Each Nare BID  . ciprofloxacin  400 mg Intravenous Q12H  . docusate sodium  100 mg Oral BID  . enoxaparin (LOVENOX) injection  40 mg Subcutaneous Q24H  . fluticasone  2 spray Each Nare Daily  . LORazepam  1 mg Intramuscular Once  . lubiprostone  24 mcg Oral BID WC  . metoCLOPramide  5 mg Oral TID AC & HS  . montelukast  10 mg Oral QHS  . pantoprazole (PROTONIX) IV  40 mg Intravenous Q12H  . polyethylene glycol  17 g Oral Daily  . saccharomyces boulardii  250 mg Oral BID   Continuous:    . 0.9 % NaCl with KCl 20 mEq / L 100 mL/hr at 01/21/12 2336   AOZ:[id number], acetaminophen, alum & mag hydroxide-simeth, hydrOXYzine, LORazepam, magic mouthwash, morphine injection, promethazine, promethazine, sodium chloride  Assessment/Plan:  Principal Problem:  *Diverticulitis of colon (without  mention of hemorrhage) Active Problems:  Hyponatremia  GERD (gastroesophageal reflux disease)  Nausea and vomiting in adult  Hypokalemia  Transaminitis  Epigastric abdominal pain    Transaminitis with Epigastric Abdominal Pain with Nausea and Vomiting LFT's stable. Etiology remains uncertain though GI feels this may be related to medications. [place] did not show gallstones. Sludge was seen. CBD was normal size. Continue PPI. Await further GI input. Leave on clear liquids for now. May need upper endoscopy if symptoms do not improve soon.  Diverticulitis of colon (without mention of hemorrhage)  See above. This aspect may be improved. Metronidazole was stopped. On IV Cipro.  Elevated Lipase More likely secondary to nausea and vomiting.   Hyponatremia  Worse today. Give NS bolus and increase rate.   Hypokalemia  Improved.   History of GERD (gastroesophageal reflux disease)  Continue Protonix BID IV   Code Status Full Code  DVT Prophylaxis Lovenox  Family Communication: Discussed with patient  Disposition Plan: Will likely return home when better.    LOS: 8 days   [facility]  [facility] Pager [phone] 01/22/2012, 10:32 AM  If 8PM-8AM, please contact night-coverage at [email or web address], password [facility]

## 2012-01-23 ENCOUNTER — Inpatient Hospital Stay (HOSPITAL_COMMUNITY): Payer: Managed Care, Other (non HMO)

## 2012-01-23 LAB — CBC
HCT: 36.2 % (ref 36.0–46.0)
MCHC: 34.3 g/dL (ref 30.0–36.0)
MCV: 93.3 fL (ref 78.0–100.0)
Platelets: 313 10*3/uL (ref 150–400)
RDW: 12.7 % (ref 11.5–15.5)

## 2012-01-23 LAB — COMPREHENSIVE METABOLIC PANEL
ALT: 118 U/L — ABNORMAL HIGH (ref 0–35)
Alkaline Phosphatase: 44 U/L (ref 39–117)
BUN: 3 mg/dL — ABNORMAL LOW (ref 6–23)
Chloride: 96 mEq/L (ref 96–112)
GFR calc Af Amer: >90 mL/min (ref >90)
Glucose, Bld: 122 mg/dL — ABNORMAL HIGH (ref 70–99)
Potassium: 3.8 mEq/L (ref 3.5–5.1)
Sodium: 130 mEq/L — ABNORMAL LOW (ref 135–145)
Total Bilirubin: 0.4 mg/dL (ref 0.3–1.2)
Total Protein: 6.2 g/dL (ref 6.0–8.3)

## 2012-01-23 MED ORDER — METOCLOPRAMIDE HCL 5 MG/5ML PO SOLN
10.0000 mg | Freq: Three times a day (TID) | ORAL | Status: DC
  Administered 2012-01-23 – 2012-01-24 (×3): 10 mg via ORAL
  Administered 2012-01-24: 5 mg via ORAL
  Administered 2012-01-25: 10 mg via ORAL
  Filled 2012-01-23 (×12): qty 10

## 2012-01-23 NOTE — Progress Notes (Signed)
[facility] Progress Note  Subjective: Still complaining of nausea but no vomiting, does not have much appetite  Objective: Vital signs in last 24 hours: Temp:  [98.2 F (36.8 C)-99.2 F (37.3 C)] 98.2 F (36.8 C) (01/20 0602) Pulse Rate:  [79-128] 79  (01/20 0602) Resp:  [16-18] 18  (01/20 0602) BP: (92-146)/(50-82) 92/50 mmHg (01/20 0602) SpO2:  [97 %-100 %] 97 % (01/20 0602) Weight change:    PE: Abdomen soft  Lab Results: Results for orders placed during the hospital encounter of 01/14/12 (from the past 24 hour(s))  COMPREHENSIVE METABOLIC PANEL     Status: Abnormal   Collection Time   01/23/12  4:05 AM      Component Value Range   Sodium 130 (*) 135 - 145 mEq/L   Potassium 3.8  3.5 - 5.1 mEq/L   Chloride 96  96 - 112 mEq/L   CO2 24  19 - 32 mEq/L   Glucose, Bld 122 (*) 70 - 99 mg/dL   BUN 3 (*) 6 - 23 mg/dL   Creatinine, Ser 8.65  0.50 - 1.10 mg/dL   Calcium 8.6  8.4 - 78.4 mg/dL   Total Protein 6.2  6.0 - 8.3 g/dL   Albumin 3.1 (*) 3.5 - 5.2 g/dL   AST 76 (*) 0 - 37 U/L   ALT 118 (*) 0 - 35 U/L   Alkaline Phosphatase 44  39 - 117 U/L   Total Bilirubin 0.4  0.3 - 1.2 mg/dL   GFR calc non Af Amer >90  >90 mL/min   GFR calc Af Amer >90  >90 mL/min  CBC     Status: Normal   Collection Time   01/23/12  4:05 AM      Component Value Range   WBC 5.6  4.0 - 10.5 K/uL   RBC 3.88  3.87 - 5.11 MIL/uL   Hemoglobin 12.4  12.0 - 15.0 g/dL   HCT 69.6  29.5 - 28.4 %   MCV 93.3  78.0 - 100.0 fL   MCH 32.0  26.0 - 34.0 pg   MCHC 34.3  30.0 - 36.0 g/dL   RDW 13.2  44.0 - 10.2 %   Platelets 313  150 - 400 K/uL    Studies/Results: No results found.    Assessment: 1. Diverticulitis, clinically resolving 2. Nausea and vomiting etiology unclear at this point 3. Rising liver function tests which appear to now be decreasing with negative abdominal ultrasound  Plan: 1. Will obtain KUB to rule out any ileus or obstruction 2. Increase Reglan to 10 mg 4 times a  day 3. Consider EGD if vomiting remains limiting factor in her improvement.    Malie Kashani C 01/23/2012, 10:34 AM

## 2012-01-23 NOTE — Progress Notes (Signed)
[facility] PROGRESS NOTE  Mandy Hanson ZOX:[id number] DOB: 1950-01-15 DOA: 01/14/2012  PCP: Allean Found, MD Primary GI: Dr. Madilyn Fireman  Brief HPI: Mandy Hanson is a 62 y.o. female with history of GERD, esophagitis, and diverticulosis who presented with abdominal pain. She stated that she began having lower abdominal cramping pain 2 days prior to admission, and then she developed nausea and vomiting which has been persistent. She stated that the vomitus was nonbloody. She denied diarrhea, fevers, and no melena. She was seen in the ED and a CT scan of her abdomen and pelvis showed findings concerning for acute diverticulitis of the sigmoid colon. No definite diverticular abscess or findings to suggest frank perforation. Her white cell count was 12.5. She was given doses of antiemetics in the ED but continued to vomit, she states Zofran doesn't help-but also so far not relieved with Phenergan. She was admitted for further evaluation and management.  Past medical history:  Past Medical History  Diagnosis Date  . GERD (gastroesophageal reflux disease)   . Asthma   . Esophagitis   . Diverticulosis    Consultants: None  Procedures: None  Antibiotics: Cipro 1/11--> Metronidazole 1/11-->1/17  Subjective: Patient had one episode of vomiting yesterday. None overnight. Still quite nauseas. Upper abdominal pain is better but persists. No other complaints.  Objective: Vital Signs  Filed Vitals:   01/22/12 1444 01/22/12 2120 01/23/12 0602 01/23/12 1000  BP: 146/82 117/71 92/50   Pulse: 90 128 79   Temp: 99.2 F (37.3 C) 98.5 F (36.9 C) 98.2 F (36.8 C) 99 F (37.2 C)  TempSrc: Oral Oral Oral Oral  Resp: 16 18 18    Height:      Weight:      SpO2: 100% 99% 97%     Intake/Output Summary (Last 24 hours) at 01/23/12 1143 Last data filed at 01/23/12 1001  Gross per 24 hour  Intake 2830.42 ml  Output   3450 ml  Net -619.58 ml   Filed Weights   01/14/12 2049  Weight:  76.658 kg (169 lb)    General appearance: alert, cooperative, appears stated age and no distress Resp: clear to auscultation bilaterally Cardio: regular rate and rhythm, S1, S2 normal, no murmur, click, rub or gallop GI: Soft, tender in the epigastrium without rebound or rigidity. Mildly tender in LLQ. No rebound rigidity. No tenderness in RUQ. No masses or organomegaly.  Extremities: extremities normal, atraumatic, no cyanosis or edema Neurologic: Alert and oriented X 3, normal strength and tone.  Lab Results:  Basic Metabolic Panel:  Lab 01/23/12 8119 01/22/12 0300 01/21/12 0340 01/20/12 0410 01/19/12 0411 01/18/12 0419  NA 130* 126* 131* 130* 133* --  K 3.8 3.5 3.0* 3.5 3.5 --  CL 96 92* 96 95* 100 --  CO2 24 24 25 23 25  --  GLUCOSE 122* 108* 118* 105* 100* --  BUN 3* 5* 6 5* 5* --  CREATININE 0.60 0.58 0.61 0.53 0.68 --  CALCIUM 8.6 8.4 8.4 8.6 8.1* --  MG -- -- 2.0 -- 2.3 2.3  PHOS -- -- -- -- -- --   Liver Function Tests:  Lab 01/23/12 0405 01/22/12 0300 01/21/12 0340 01/20/12 0410  AST 76* 110* 135* 71*  ALT 118* 133* 131* 87*  ALKPHOS 44 48 48 52  BILITOT 0.4 0.4 0.4 0.4  PROT 6.2 6.6 6.8 7.0  ALBUMIN 3.1* 3.3* 3.5 3.3*    Lab 01/21/12 0340 01/20/12 0410 01/19/12 0411 01/18/12 0419  LIPASE 62* 44 69* 66*  AMYLASE -- -- -- --   CBC:  Lab 01/23/12 0405 01/21/12 0340 01/20/12 0410 01/19/12 0411 01/18/12 0419  WBC 5.6 9.1 7.6 5.6 5.3  NEUTROABS -- -- -- -- --  HGB 12.4 12.9 13.8 12.3 14.1  HCT 36.2 37.9 40.2 36.2 41.1  MCV 93.3 93.1 92.2 93.5 93.0  PLT 313 356 366 320 358    Recent Results (from the past 240 hour(s))  URINE CULTURE     Status: Normal   Collection Time   01/14/12  3:10 PM      Component Value Range Status Comment   Specimen Description URINE, CLEAN CATCH   Final    Special Requests NONE   Final    Culture  Setup Time 01/14/2012 18:50   Final    Colony Count 15,000 COLONIES/ML   Final    Culture     Final    Value: Multiple bacterial  morphotypes present, none predominant. Suggest appropriate recollection if clinically indicated.   Report Status 01/15/2012 FINAL   Final       Studies/Results: No results found.  Medications:  Scheduled:    . azelastine  1 spray Each Nare BID  . ciprofloxacin  400 mg Intravenous Q12H  . docusate sodium  100 mg Oral BID  . enoxaparin (LOVENOX) injection  40 mg Subcutaneous Q24H  . fluticasone  2 spray Each Nare Daily  . LORazepam  1 mg Intramuscular Once  . lubiprostone  24 mcg Oral BID WC  . metoCLOPramide  10 mg Oral TID AC & HS  . montelukast  10 mg Oral QHS  . pantoprazole (PROTONIX) IV  40 mg Intravenous Q12H  . polyethylene glycol  17 g Oral Daily  . saccharomyces boulardii  250 mg Oral BID   Continuous:    . 0.9 % NaCl with KCl 20 mEq / L 125 mL/hr at 01/23/12 2130   QMV:HQIONGEXBMWUX, acetaminophen, alum & mag hydroxide-simeth, hydrOXYzine, LORazepam, magic mouthwash, morphine injection, promethazine, promethazine, sodium chloride  Assessment/Plan:  Principal Problem:  *Diverticulitis of colon (without mention of hemorrhage) Active Problems:  Hyponatremia  GERD (gastroesophageal reflux disease)  Nausea and vomiting in adult  Hypokalemia  Transaminitis  Epigastric abdominal pain    Transaminitis with Epigastric Abdominal Pain with Nausea and Vomiting LFT's improved. Etiology remains uncertain though GI feels this may be related to medications. [place] did not show gallstones. Sludge was seen. CBD was normal size. Continue PPI. KUB today. Leave on clear liquids for now. May need upper endoscopy if symptoms do not improve soon.  Diverticulitis of colon (without mention of hemorrhage)  See above. This aspect may be improved. Metronidazole was stopped. On IV Cipro.   Elevated Lipase More likely secondary to nausea and vomiting.   Hyponatremia  Improved. Continue NS.   Hypokalemia  Improved.   History of GERD (gastroesophageal reflux disease)  Continue  Protonix BID IV   Code Status Full Code  DVT Prophylaxis Lovenox  Family Communication: Discussed with patient  Disposition Plan: Will likely return home when better.    LOS: 9 days   [facility]  [facility] Pager [phone] 01/23/2012, 11:43 AM  If 8PM-8AM, please contact night-coverage at [email or web address], password [facility]

## 2012-01-24 ENCOUNTER — Inpatient Hospital Stay (HOSPITAL_COMMUNITY): Payer: Managed Care, Other (non HMO)

## 2012-01-24 LAB — COMPREHENSIVE METABOLIC PANEL
AST: 52 U/L — ABNORMAL HIGH (ref 0–37)
Albumin: 3.3 g/dL — ABNORMAL LOW (ref 3.5–5.2)
BUN: 4 mg/dL — ABNORMAL LOW (ref 6–23)
Creatinine, Ser: 0.65 mg/dL (ref 0.50–1.10)
Total Protein: 6.6 g/dL (ref 6.0–8.3)

## 2012-01-24 MED ORDER — IOHEXOL 300 MG/ML  SOLN
100.0000 mL | Freq: Once | INTRAMUSCULAR | Status: AC | PRN
  Administered 2012-01-24: 100 mL via INTRAVENOUS

## 2012-01-24 NOTE — Progress Notes (Signed)
[facility] Progress Note  Subjective: Continues to have nausea and vomiting with improvement in her lower abdominal pain, somewhat puzzling at this point  Objective: Vital signs in last 24 hours: Temp:  [98.9 F (37.2 C)-99.9 F (37.7 C)] 99.5 F (37.5 C) (01/21 0520) Pulse Rate:  [87-104] 87  (01/21 0520) Resp:  [16-20] 20  (01/21 0520) BP: (138-147)/(70-77) 138/70 mmHg (01/21 0520) SpO2:  [98 %-100 %] 100 % (01/21 0520) Weight change:    PE: Abdomen soft mild tenderness in the left lower quadrant  Lab Results: Results for orders placed during the hospital encounter of 01/14/12 (from the past 24 hour(s))  COMPREHENSIVE METABOLIC PANEL     Status: Abnormal   Collection Time   01/24/12  5:09 AM      Component Value Range   Sodium 134 (*) 135 - 145 mEq/L   Potassium 3.9  3.5 - 5.1 mEq/L   Chloride 99  96 - 112 mEq/L   CO2 25  19 - 32 mEq/L   Glucose, Bld 102 (*) 70 - 99 mg/dL   BUN 4 (*) 6 - 23 mg/dL   Creatinine, Ser 1.61  0.50 - 1.10 mg/dL   Calcium 8.8  8.4 - 09.6 mg/dL   Total Protein 6.6  6.0 - 8.3 g/dL   Albumin 3.3 (*) 3.5 - 5.2 g/dL   AST 52 (*) 0 - 37 U/L   ALT 102 (*) 0 - 35 U/L   Alkaline Phosphatase 47  39 - 117 U/L   Total Bilirubin 0.4  0.3 - 1.2 mg/dL   GFR calc non Af Amer >90  >90 mL/min   GFR calc Af Amer >90  >90 mL/min    Studies/Results: Dg Abd 2 Views  February 05, 2012  *RADIOLOGY REPORT*  Clinical Data: 62 year old female with abdominal pain and nausea.  ABDOMEN - 2 VIEW  Comparison: CT abdomen pelvis 01/14/2012.  Findings: Dextroconvex thoracolumbar scoliosis.  Lung bases appear stable. Nonobstructed bowel gas pattern.  No pneumoperitoneum. No acute osseous abnormality identified.  IMPRESSION:  Nonobstructed bowel gas pattern, no free air.   Original Report Authenticated By: Erskine Speed, M.D.       Assessment: Diverticulitis with significant CT changes and free fluid on December 11 clinically seemingly improved except for continued nausea  and vomiting not responding to symptomatic treatment  Plan: Given the rather markedly changes on her initial CT will repeat the study to make sure that she has not developed an abscess or other complication of diverticulitis, especially given that her Flagyl was discontinued prematurely due to suspected contribution to her nausea. We'll also consider EGD depending on what the CT shows.    Laverne Klugh C 01/24/2012, 10:37 AM

## 2012-01-24 NOTE — Progress Notes (Signed)
[facility] PROGRESS NOTE  SUEKO DIMICHELE ZOX:[id number] DOB: November 27, 1950 DOA: 01/14/2012  PCP: Allean Found, MD Primary GI: Dr. Madilyn Fireman  Brief HPI: Mandy Hanson is a 62 y.o. female with history of GERD, esophagitis, and diverticulosis who presented with abdominal pain. She stated that she began having lower abdominal cramping pain 2 days prior to admission, and then she developed nausea and vomiting which has been persistent. She stated that the vomitus was nonbloody. She denied diarrhea, fevers, and no melena. She was seen in the ED and a CT scan of her abdomen and pelvis showed findings concerning for acute diverticulitis of the sigmoid colon. No definite diverticular abscess or findings to suggest frank perforation. Her white cell count was 12.5. She was given doses of antiemetics in the ED but continued to vomit, she states Zofran doesn't help-but also so far not relieved with Phenergan. She was admitted for further evaluation and management.  Past medical history:  Past Medical History  Diagnosis Date  . GERD (gastroesophageal reflux disease)   . Asthma   . Esophagitis   . Diverticulosis    Consultants: None  Procedures: None  Antibiotics: Cipro 1/11--> Metronidazole 1/11-->1/17  Subjective: Patient continues to have vomiting. Still quite nauseas. Upper abdominal pain is better but persists. No new complaints.  Objective: Vital Signs  Filed Vitals:   01/23/12 1450 01/23/12 1831 01/23/12 2151 01/24/12 0520  BP: 138/77  147/73 138/70  Pulse: 104  89 87  Temp: 99.9 F (37.7 C) 99.5 F (37.5 C) 98.9 F (37.2 C) 99.5 F (37.5 C)  TempSrc: Oral Oral Oral Oral  Resp: 16  20 20   Height:      Weight:      SpO2: 98%  100% 100%    Intake/Output Summary (Last 24 hours) at 01/24/12 1206 Last data filed at 01/24/12 8119  Gross per 24 hour  Intake 2947.92 ml  Output   2075 ml  Net 872.92 ml   Filed Weights   01/14/12 2049  Weight: 76.658 kg (169 lb)     General appearance: alert, cooperative, appears stated age and no distress Resp: clear to auscultation bilaterally Cardio: regular rate and rhythm, S1, S2 normal, no murmur, click, rub or gallop GI: Soft, tender in the epigastrium without rebound or rigidity. Mildly tender in LLQ. No rebound rigidity. No tenderness in RUQ. No masses or organomegaly.  Extremities: extremities normal, atraumatic, no cyanosis or edema Neurologic: Alert and oriented X 3, normal strength and tone.  Lab Results:  Basic Metabolic Panel:  Lab 01/24/12 1478 01/23/12 0405 01/22/12 0300 01/21/12 0340 01/20/12 0410 01/19/12 0411 01/18/12 0419  NA 134* 130* 126* 131* 130* -- --  K 3.9 3.8 3.5 3.0* 3.5 -- --  CL 99 96 92* 96 95* -- --  CO2 25 24 24 25 23  -- --  GLUCOSE 102* 122* 108* 118* 105* -- --  BUN 4* 3* 5* 6 5* -- --  CREATININE 0.65 0.60 0.58 0.61 0.53 -- --  CALCIUM 8.8 8.6 8.4 8.4 8.6 -- --  MG -- -- -- 2.0 -- 2.3 2.3  PHOS -- -- -- -- -- -- --   Liver Function Tests:  Lab 01/24/12 0509 01/23/12 0405 01/22/12 0300 01/21/12 0340 01/20/12 0410  AST 52* 76* 110* 135* 71*  ALT 102* 118* 133* 131* 87*  ALKPHOS 47 44 48 48 52  BILITOT 0.4 0.4 0.4 0.4 0.4  PROT 6.6 6.2 6.6 6.8 7.0  ALBUMIN 3.3* 3.1* 3.3* 3.5 3.3*  Lab 01/21/12 0340 01/20/12 0410 01/19/12 0411 01/18/12 0419  LIPASE 62* 44 69* 66*  AMYLASE -- -- -- --   CBC:  Lab 2012-02-12 0405 01/21/12 0340 01/20/12 0410 01/19/12 0411 01/18/12 0419  WBC 5.6 9.1 7.6 5.6 5.3  NEUTROABS -- -- -- -- --  HGB 12.4 12.9 13.8 12.3 14.1  HCT 36.2 37.9 40.2 36.2 41.1  MCV 93.3 93.1 92.2 93.5 93.0  PLT 313 356 366 320 358    Recent Results (from the past 240 hour(s))  URINE CULTURE     Status: Normal   Collection Time   01/14/12  3:10 PM      Component Value Range Status Comment   Specimen Description URINE, CLEAN CATCH   Final    Special Requests NONE   Final    Culture  Setup Time 01/14/2012 18:50   Final    Colony Count 15,000 COLONIES/ML    Final    Culture     Final    Value: Multiple bacterial morphotypes present, none predominant. Suggest appropriate recollection if clinically indicated.   Report Status 01/15/2012 FINAL   Final       Studies/Results: Dg Abd 2 Views  02-12-2012  *RADIOLOGY REPORT*  Clinical Data: 62 year old female with abdominal pain and nausea.  ABDOMEN - 2 VIEW  Comparison: CT abdomen pelvis 01/14/2012.  Findings: Dextroconvex thoracolumbar scoliosis.  Lung bases appear stable. Nonobstructed bowel gas pattern.  No pneumoperitoneum. No acute osseous abnormality identified.  IMPRESSION:  Nonobstructed bowel gas pattern, no free air.   Original Report Authenticated By: Erskine Speed, M.D.     Medications:  Scheduled:    . azelastine  1 spray Each Nare BID  . ciprofloxacin  400 mg Intravenous Q12H  . docusate sodium  100 mg Oral BID  . enoxaparin (LOVENOX) injection  40 mg Subcutaneous Q24H  . fluticasone  2 spray Each Nare Daily  . LORazepam  1 mg Intramuscular Once  . lubiprostone  24 mcg Oral BID WC  . metoCLOPramide  10 mg Oral TID AC & HS  . montelukast  10 mg Oral QHS  . pantoprazole (PROTONIX) IV  40 mg Intravenous Q12H  . polyethylene glycol  17 g Oral Daily  . saccharomyces boulardii  250 mg Oral BID   Continuous:    . 0.9 % NaCl with KCl 20 mEq / L 125 mL/hr at 01/24/12 1015   OZH:[id number], acetaminophen, alum & mag hydroxide-simeth, hydrOXYzine, LORazepam, magic mouthwash, morphine injection, promethazine, promethazine, sodium chloride  Assessment/Plan:  Principal Problem:  *Diverticulitis of colon (without mention of hemorrhage) Active Problems:  Hyponatremia  GERD (gastroesophageal reflux disease)  Nausea and vomiting in adult  Hypokalemia  Transaminitis  Epigastric abdominal pain    Epigastric Abdominal Pain with Nausea and Vomiting Etiology remains unclear. GI has ordered repeat CT. On clear liquids. Continue PPI. May need upper endoscopy if symptoms do not improve  soon and if Ct does not show new changes.  Diverticulitis of colon (without mention of hemorrhage)  See above. This aspect may be improved. Metronidazole was stopped. On IV Cipro.   Transaminitis LFT continues to improve slowly. [place] did not show gallstones. Sludge was seen. CBD was normal size. Thought to be secondary to medications.  Hyponatremia  Improved. Continue NS.   Hypokalemia  Improved.   Elevated Lipase More likely secondary to nausea and vomiting.   History of GERD (gastroesophageal reflux disease)  Continue Protonix BID IV   Code Status Full Code  DVT Prophylaxis Lovenox  Family Communication: Discussed with patient  Disposition Plan: Will likely return home when better. Not ready for discharge.   LOS: 10 days   [facility]  [facility] Pager [phone] 01/24/2012, 12:06 PM  If 8PM-8AM, please contact night-coverage at [email or web address], password [facility]

## 2012-01-25 ENCOUNTER — Encounter (HOSPITAL_COMMUNITY)
Admission: EM | Disposition: A | Discharge status: Home / Self Care | Payer: Self-pay | Source: Home / Self Care | Attending: Internal Medicine

## 2012-01-25 ENCOUNTER — Encounter (HOSPITAL_COMMUNITY): Payer: Self-pay | Admitting: *Deleted

## 2012-01-25 DIAGNOSIS — R1013 Epigastric pain: Secondary | ICD-10-CM

## 2012-01-25 HISTORY — PX: ESOPHAGOGASTRODUODENOSCOPY: SHX5428

## 2012-01-25 LAB — CBC
HCT: 35.3 % — ABNORMAL LOW (ref 36.0–46.0)
Hemoglobin: 11.8 g/dL — ABNORMAL LOW (ref 12.0–15.0)
MCV: 93.9 fL (ref 78.0–100.0)
RBC: 3.76 MIL/uL — ABNORMAL LOW (ref 3.87–5.11)
WBC: 4.5 10*3/uL (ref 4.0–10.5)

## 2012-01-25 LAB — COMPREHENSIVE METABOLIC PANEL
AST: 37 U/L (ref 0–37)
BUN: 4 mg/dL — ABNORMAL LOW (ref 6–23)
CO2: 24 mEq/L (ref 19–32)
Chloride: 98 mEq/L (ref 96–112)
Creatinine, Ser: 0.65 mg/dL (ref 0.50–1.10)
GFR calc Af Amer: >90 mL/min (ref >90)
GFR calc non Af Amer: >90 mL/min (ref >90)
Glucose, Bld: 114 mg/dL — ABNORMAL HIGH (ref 70–99)
Total Bilirubin: 0.3 mg/dL (ref 0.3–1.2)

## 2012-01-25 SURGERY — EGD (ESOPHAGOGASTRODUODENOSCOPY)
Anesthesia: Moderate Sedation

## 2012-01-25 MED ORDER — DIPHENHYDRAMINE HCL 50 MG/ML IJ SOLN
INTRAMUSCULAR | Status: AC
  Filled 2012-01-25: qty 1

## 2012-01-25 MED ORDER — FENTANYL CITRATE 0.05 MG/ML IJ SOLN
INTRAMUSCULAR | Status: AC
  Filled 2012-01-25: qty 2

## 2012-01-25 MED ORDER — METOCLOPRAMIDE HCL 10 MG PO TABS
10.0000 mg | ORAL_TABLET | Freq: Three times a day (TID) | ORAL | Status: DC
  Administered 2012-01-25 – 2012-01-31 (×22): 10 mg via ORAL
  Filled 2012-01-25 (×28): qty 1

## 2012-01-25 MED ORDER — SODIUM CHLORIDE 0.9 % IV SOLN
INTRAVENOUS | Status: DC
  Administered 2012-01-25: 500 mL via INTRAVENOUS

## 2012-01-25 MED ORDER — BUTAMBEN-TETRACAINE-BENZOCAINE 2-2-14 % EX AERO
INHALATION_SPRAY | CUTANEOUS | Status: DC | PRN
  Administered 2012-01-25: 2 via TOPICAL

## 2012-01-25 MED ORDER — MIDAZOLAM HCL 10 MG/2ML IJ SOLN
INTRAMUSCULAR | Status: DC | PRN
  Administered 2012-01-25 (×3): 2 mg via INTRAVENOUS

## 2012-01-25 MED ORDER — FENTANYL CITRATE 0.05 MG/ML IJ SOLN
INTRAMUSCULAR | Status: DC | PRN
  Administered 2012-01-25 (×2): 25 ug via INTRAVENOUS

## 2012-01-25 MED ORDER — MIDAZOLAM HCL 10 MG/2ML IJ SOLN
INTRAMUSCULAR | Status: AC
  Filled 2012-01-25: qty 2

## 2012-01-25 NOTE — Progress Notes (Signed)
Patient ID: Mandy Hanson, female   DOB: 1950-02-27, 62 y.o.   MRN: [id number] [facility] Progress Note  Subjective: Patient was no new complaints  Objective: Vital signs in last 24 hours: Temp:  [97.7 F (36.5 C)-99.5 F (37.5 C)] 97.9 F (36.6 C) (01/22 1310) Pulse Rate:  [67-106] 77  (01/22 1300) Resp:  [11-20] 12  (01/22 1310) BP: (92-133)/(58-78) 98/60 mmHg (01/22 1310) SpO2:  [99 %-100 %] 100 % (01/22 1310) Weight change:    PE: Unchanged  Lab Results: Results for orders placed during the hospital encounter of 01/14/12 (from the past 24 hour(s))  CBC     Status: Abnormal   Collection Time   01/25/12  5:00 AM      Component Value Range   WBC 4.5  4.0 - 10.5 K/uL   RBC 3.76 (*) 3.87 - 5.11 MIL/uL   Hemoglobin 11.8 (*) 12.0 - 15.0 g/dL   HCT 78.2 (*) 95.6 - 21.3 %   MCV 93.9  78.0 - 100.0 fL   MCH 31.4  26.0 - 34.0 pg   MCHC 33.4  30.0 - 36.0 g/dL   RDW 08.6  57.8 - 46.9 %   Platelets 285  150 - 400 K/uL  COMPREHENSIVE METABOLIC PANEL     Status: Abnormal   Collection Time   01/25/12  5:00 AM      Component Value Range   Sodium 130 (*) 135 - 145 mEq/L   Potassium 3.6  3.5 - 5.1 mEq/L   Chloride 98  96 - 112 mEq/L   CO2 24  19 - 32 mEq/L   Glucose, Bld 114 (*) 70 - 99 mg/dL   BUN 4 (*) 6 - 23 mg/dL   Creatinine, Ser 6.29  0.50 - 1.10 mg/dL   Calcium 8.4  8.4 - 52.8 mg/dL   Total Protein 5.9 (*) 6.0 - 8.3 g/dL   Albumin 2.8 (*) 3.5 - 5.2 g/dL   AST 37  0 - 37 U/L   ALT 78 (*) 0 - 35 U/L   Alkaline Phosphatase 43  39 - 117 U/L   Total Bilirubin 0.3  0.3 - 1.2 mg/dL   GFR calc non Af Amer >90  >90 mL/min   GFR calc Af Amer >90  >90 mL/min    Studies/Results: Ct Abdomen Pelvis W Contrast  01/24/2012  *RADIOLOGY REPORT*  Clinical Data: Follow-up diverticulitis.  CT ABDOMEN AND PELVIS WITH CONTRAST  Technique:  Multidetector CT imaging of the abdomen and pelvis was performed following the standard protocol during bolus administration of intravenous  contrast.  Contrast: OMNIPAQUE IOHEXOL 300 MG/ML  SOLN  Comparison: 01/14/2012  Findings: Sigmoid diverticulitis has nearly completely resolved since previous study.  No evidence of abscess or free fluid.  No evidence of bowel obstruction.  No other inflammatory process or abnormal fluid collections are identified.  The liver, gallbladder, pancreas, spleen, adrenal glands, and kidneys are normal in appearance.  No evidence of hydronephrosis. No soft tissue masses or lymphadenopathy identified.  The patient has undergone previous hysterectomy.  Pessary is noted within the vagina.  IMPRESSION:  Near complete resolution of sigmoid diverticulitis since prior exam.  No evidence of abscess or other acute findings.   Original Report Authenticated By: Myles Rosenthal, M.D.       Assessment: 1. Diverticulitis, improved with confirmation by CT scan 2. Nausea and vomiting, etiology still unclear, endoscopy today completely normal.  Plan: Will relay reassuring findings that she does not have an ulcer, continue  Reglan and switch to by mouth, and advance diet as tolerated. Next step if she continues to vomit would be a gastric emptying study.    [name] 01/25/2012, 1:29 PM

## 2012-01-25 NOTE — Progress Notes (Signed)
Patient ID: Mandy Hanson, female   DOB: 11-20-1950, 63 y.o.   MRN: [id number]  [facility] PROGRESS NOTE  Mandy STREBEL MVH:[id number] DOB: Aug 18, 1950 DOA: 01/14/2012 PCP: Allean Found, MD  Brief narrative: 62 y.o. female with history of GERD, esophagitis, and diverticulosis who presented with abdominal pain. She stated that she began having lower abdominal cramping pain 2 days prior to admission, and then she developed nausea and vomiting which has been persistent. She stated that the vomitus was nonbloody. She denied diarrhea, fevers, and no melena. She was seen in the ED and a CT scan of her abdomen and pelvis showed findings concerning for acute diverticulitis of the sigmoid colon. No definite diverticular abscess or findings to suggest frank perforation. Her white cell count was 12.5. She was given doses of antiemetics in the ED but continued to vomit, she states Zofran doesn't help-but also so far not relieved with Phenergan. She was admitted for further evaluation and management.   Principal Problem:  *Diverticulitis of colon (without mention of hemorrhage - appears to be nearly resolved at this point - EGD normal - Continue Ciprofloxacin Active Problems:  Hyponatremia - remains at baseline - BMP in AM  GERD (gastroesophageal reflux disease) - continue Protonix IV and plan to transition to PO  Nausea and vomiting in adult - unclear etiology at this time - EGD normal  - ? Need for gastric emptying study   Hypokalemia - secondary to vomiting - within normal limits this time  Transaminitis - continuing to trend down  Consultants:  GI  Procedures/Studies: EGD 01/25/2012 - Dr. Madilyn Fireman - normal EGD  Ct Abdomen Pelvis W Contrast 01/24/2012   Near complete resolution of sigmoid diverticulitis since prior exam.  No evidence of abscess or other acute findings.    Antibiotics:  Cipro 1/11-->   Metronidazole 1/11-->1/17  Code Status: Full Family  Communication: Pt at bedside Disposition Plan: Home when medically stable  HPI/Subjective: No events overnight.   Objective: Filed Vitals:   01/25/12 1315 01/25/12 1330 01/25/12 1418 01/25/12 2104  BP: 98/60 107/64 98/66 137/83  Pulse:   66 106  Temp:   97.7 F (36.5 C) 99.8 F (37.7 C)  TempSrc:   Oral Oral  Resp: 12 17 18 16   Height:      Weight:      SpO2: 100% 100% 100% 99%    Intake/Output Summary (Last 24 hours) at 01/25/12 2138 Last data filed at 01/25/12 2104  Gross per 24 hour  Intake   3275 ml  Output   1200 ml  Net   2075 ml    Exam:   General:  Pt is alert, follows commands appropriately, not in acute distress  Cardiovascular: Regular rate and rhythm, S1/S2, no murmurs, no rubs, no gallops  Respiratory: Clear to auscultation bilaterally, no wheezing, no crackles, no rhonchi  Abdomen: Soft, non tender, non distended, bowel sounds present, no guarding  Extremities: No edema, pulses DP and PT palpable bilaterally  Neuro: Grossly nonfocal  Data Reviewed: Basic Metabolic Panel:  Lab 01/25/12 8413 01/24/12 0509 01/23/12 0405 01/22/12 0300 01/21/12 0340 01/19/12 0411  NA 130* 134* 130* 126* 131* --  K 3.6 3.9 3.8 3.5 3.0* --  CL 98 99 96 92* 96 --  CO2 24 25 24 24 25  --  GLUCOSE 114* 102* 122* 108* 118* --  BUN 4* 4* 3* 5* 6 --  CREATININE 0.65 0.65 0.60 0.58 0.61 --  CALCIUM 8.4 8.8 8.6 8.4 8.4 --  MG -- -- -- --  2.0 2.3  PHOS -- -- -- -- -- --   Liver Function Tests:  Lab 01/25/12 0500 01/24/12 0509 01/23/12 0405 01/22/12 0300 01/21/12 0340  AST 37 52* 76* 110* 135*  ALT 78* 102* 118* 133* 131*  ALKPHOS 43 47 44 48 48  BILITOT 0.3 0.4 0.4 0.4 0.4  PROT 5.9* 6.6 6.2 6.6 6.8  ALBUMIN 2.8* 3.3* 3.1* 3.3* 3.5    Lab 01/21/12 0340 01/20/12 0410 01/19/12 0411  LIPASE 62* 44 69*  AMYLASE -- -- --   CBC:  Lab 01/25/12 0500 01/23/12 0405 01/21/12 0340 01/20/12 0410 01/19/12 0411  WBC 4.5 5.6 9.1 7.6 5.6  NEUTROABS -- -- -- -- --  HGB 11.8*  12.4 12.9 13.8 12.3  HCT 35.3* 36.2 37.9 40.2 36.2  MCV 93.9 93.3 93.1 92.2 93.5  PLT 285 313 356 366 320   Scheduled Meds:   . azelastine  1 spray Each Nare BID  . ciprofloxacin  400 mg Intravenous Q12H  . docusate sodium  100 mg Oral BID  . enoxaparin injection  40 mg Subcutaneous Q24H  . fluticasone  2 spray Each Nare Daily  . LORazepam  1 mg Intramuscular Once  . lubiprostone  24 mcg Oral BID WC  . metoCLOPramide  10 mg Oral TID AC & HS  . montelukast  10 mg Oral QHS  . pantoprazole  IV  40 mg Intravenous Q12H  . polyethylene glycol  17 g Oral Daily  . saccharomyces boulardii  250 mg Oral BID   Continuous Infusions:   . 0.9 % NaCl with KCl 20 mEq / L 125 mL/hr at 01/25/12 1407     Debbora Presto, MD  [facility] Pager [phone]  If 7PM-7AM, please contact night-coverage [email or web address] Password [facility] 01/25/2012, 9:38 PM   LOS: 11 days

## 2012-01-25 NOTE — Op Note (Signed)
[facility] [address] [place], [address]   ENDOSCOPY PROCEDURE REPORT  PATIENT: Mandy Hanson, Mandy Hanson  MR#: [id number] BIRTHDATE: 17-Jan-1950 , 61  yrs. old GENDER: Female ENDOSCOPIST:Aurilla Coulibaly Madilyn Fireman, MD REFERRED BY: PROCEDURE DATE:  01/25/2012 PROCEDURE: ASA CLASS: INDICATIONS:  refractory nausea and vomiting MEDICATION:    fentanyl 50 mcg, Versed 6 TOPICAL ANESTHETIC:  DESCRIPTION OF PROCEDURE:   esophagus: Normal Stomach: Normal no retained food in stomach Duodenum: Normal     COMPLICATIONS: None  ENDOSCOPIC IMPRESSION:normal endoscopy  RECOMMENDATIONS:by mouth Reglan and advance diet as tolerated.    _______________________________ Rosalie DoctorDorena Cookey, MD 01/25/2012 1:19 PM

## 2012-01-26 ENCOUNTER — Encounter (HOSPITAL_COMMUNITY): Payer: Self-pay | Admitting: Gastroenterology

## 2012-01-26 LAB — BASIC METABOLIC PANEL
CO2: 23 mEq/L (ref 19–32)
Calcium: 8.2 mg/dL — ABNORMAL LOW (ref 8.4–10.5)
Potassium: 3.9 mEq/L (ref 3.5–5.1)
Sodium: 131 mEq/L — ABNORMAL LOW (ref 135–145)

## 2012-01-26 LAB — CBC
MCH: 31.9 pg (ref 26.0–34.0)
Platelets: 275 10*3/uL (ref 150–400)
RBC: 3.76 MIL/uL — ABNORMAL LOW (ref 3.87–5.11)
WBC: 6.6 10*3/uL (ref 4.0–10.5)

## 2012-01-26 MED ORDER — GI COCKTAIL ~~LOC~~
30.0000 mL | Freq: Three times a day (TID) | ORAL | Status: DC
  Administered 2012-01-26 – 2012-01-31 (×13): 30 mL via ORAL
  Filled 2012-01-26 (×17): qty 30

## 2012-01-26 NOTE — Progress Notes (Signed)
[facility] Progress Note  Subjective: Still having some vomiting but also holding down some food on a bland diet appeared  Objective: Vital signs in last 24 hours: Temp:  [97.7 F (36.5 C)-99.8 F (37.7 C)] 98.5 F (36.9 C) (01/23 0605) Pulse Rate:  [66-106] 81  (01/23 0605) Resp:  [11-18] 16  (01/23 0605) BP: (92-137)/(55-83) 92/55 mmHg (01/23 0605) SpO2:  [98 %-100 %] 98 % (01/23 0605) Weight change:    PE: Unchanged  Lab Results: Results for orders placed during the hospital encounter of 01/14/12 (from the past 24 hour(s))  CBC     Status: Abnormal   Collection Time   01/26/12  4:10 AM      Component Value Range   WBC 6.6  4.0 - 10.5 K/uL   RBC 3.76 (*) 3.87 - 5.11 MIL/uL   Hemoglobin 12.0  12.0 - 15.0 g/dL   HCT 16.1 (*) 09.6 - 04.5 %   MCV 93.1  78.0 - 100.0 fL   MCH 31.9  26.0 - 34.0 pg   MCHC 34.3  30.0 - 36.0 g/dL   RDW 40.9  81.1 - 91.4 %   Platelets 275  150 - 400 K/uL  BASIC METABOLIC PANEL     Status: Abnormal   Collection Time   01/26/12  4:10 AM      Component Value Range   Sodium 131 (*) 135 - 145 mEq/L   Potassium 3.9  3.5 - 5.1 mEq/L   Chloride 99  96 - 112 mEq/L   CO2 23  19 - 32 mEq/L   Glucose, Bld 93  70 - 99 mg/dL   BUN 4 (*) 6 - 23 mg/dL   Creatinine, Ser 7.82  0.50 - 1.10 mg/dL   Calcium 8.2 (*) 8.4 - 10.5 mg/dL   GFR calc non Af Amer >90  >90 mL/min   GFR calc Af Amer >90  >90 mL/min    Studies/Results: Ct Abdomen Pelvis W Contrast  01/24/2012  *RADIOLOGY REPORT*  Clinical Data: Follow-up diverticulitis.  CT ABDOMEN AND PELVIS WITH CONTRAST  Technique:  Multidetector CT imaging of the abdomen and pelvis was performed following the standard protocol during bolus administration of intravenous contrast.  Contrast: OMNIPAQUE IOHEXOL 300 MG/ML  SOLN  Comparison: 01/14/2012  Findings: Sigmoid diverticulitis has nearly completely resolved since previous study.  No evidence of abscess or free fluid.  No evidence of bowel  obstruction.  No other inflammatory process or abnormal fluid collections are identified.  The liver, gallbladder, pancreas, spleen, adrenal glands, and kidneys are normal in appearance.  No evidence of hydronephrosis. No soft tissue masses or lymphadenopathy identified.  The patient has undergone previous hysterectomy.  Pessary is noted within the vagina.  IMPRESSION:  Near complete resolution of sigmoid diverticulitis since prior exam.  No evidence of abscess or other acute findings.   Original Report Authenticated By: Myles Rosenthal, M.D.       Assessment: Persistent nausea and vomiting after apparently successful treatment of diverticulitis, etiology unclear with normal endoscopy and currently on oral Reglan.  Plan: Will discontinue Cipro to see if this is somehow contributing. Continue bland diet and hopefully consider discharge tomorrow    Trevia Nop C 01/26/2012, 10:46 AM

## 2012-01-26 NOTE — Progress Notes (Signed)
Patient ID: Mandy Hanson, female   DOB: 07-27-1950, 62 y.o.   MRN: [id number] [facility] PROGRESS NOTE  Mandy Hanson [id number]:[id number] DOB: Oct 18, 1950 DOA: 01/14/2012 PCP: Allean Found, MD  Brief narrative:  62 y.o. female with history of GERD, esophagitis, and diverticulosis who presented with abdominal pain. She stated that she began having lower abdominal cramping pain 2 days prior to admission, and then she developed nausea and vomiting which has been persistent. She stated that the vomitus was nonbloody. She denied diarrhea, fevers, and no melena. She was seen in the ED and a CT scan of her abdomen and pelvis showed findings concerning for acute diverticulitis of the sigmoid colon. No definite diverticular abscess or findings to suggest frank perforation. Her white cell count was 12.5. She was given doses of antiemetics in the ED but continued to vomit, she states Zofran doesn't help-but also so far not relieved with Phenergan. She was admitted for further evaluation and management.   Principal Problem:  *Diverticulitis of colon (without mention of hemorrhage) - appears to be nearly resolved at this point  - EGD normal so etiology remains unclear at this point - discontinue Ciprofloxacin as per GI recommendations and will plan on adding GI cocktail to see if it helps with symptoms  Active Problems:  Hyponatremia  - remains at baseline and slightly improved since yesterday - CMET in AM  GERD (gastroesophageal reflux disease)  - continue Protonix IV and plan to transition to PO  Nausea and vomiting in adult  - unclear etiology at this time  - EGD normal  - ? Need for gastric emptying study  Hypokalemia  - secondary to vomiting  - within normal limits this time  Transaminitis  - continuing to trend down  - CMET in AM  Consultants:  GI Procedures/Studies:  EGD 01/25/2012 - Dr. Madilyn Fireman - normal EGD  Ct Abdomen Pelvis W Contrast  01/24/2012  Near complete resolution of  sigmoid diverticulitis since prior exam. No evidence of abscess or other acute findings.  Antibiotics:  Cipro 1/11--> 1/23 Metronidazole 1/11--> 1/17  Code Status: Full  Family Communication: Pt at bedside  Disposition Plan: Home when medically stable    HPI/Subjective: No events overnight. Pt still reports nausea.   Objective: Filed Vitals:   01/25/12 1418 01/25/12 2104 01/26/12 0605 01/26/12 1400  BP: 98/66 137/83 92/55 148/76  Pulse: 66 106 81 96  Temp: 97.7 F (36.5 C) 99.8 F (37.7 C) 98.5 F (36.9 C) 98.4 F (36.9 C)  TempSrc: Oral Oral Oral Oral  Resp: 18 16 16 18   Height:      Weight:      SpO2: 100% 99% 98% 100%    Intake/Output Summary (Last 24 hours) at 01/26/12 1629 Last data filed at 01/26/12 1400  Gross per 24 hour  Intake   2700 ml  Output   3900 ml  Net  -1200 ml    Exam:   General:  Pt is alert, follows commands appropriately, not in acute distress  Cardiovascular: Regular rate and rhythm, S1/S2, no murmurs, no rubs, no gallops  Respiratory: Clear to auscultation bilaterally, no wheezing, no crackles, no rhonchi  Abdomen: Soft, non tender, non distended, bowel sounds present, no guarding  Extremities: No edema, pulses DP and PT palpable bilaterally  Neuro: Grossly nonfocal  Data Reviewed: Basic Metabolic Panel:  Lab 01/26/12 9562 01/25/12 0500 01/24/12 0509 01/23/12 0405 01/22/12 0300 01/21/12 0340  NA 131* 130* 134* 130* 126* --  K 3.9 3.6 3.9  3.8 3.5 --  CL 99 98 99 96 92* --  CO2 23 24 25 24 24  --  GLUCOSE 93 114* 102* 122* 108* --  BUN 4* 4* 4* 3* 5* --  CREATININE 0.65 0.65 0.65 0.60 0.58 --  CALCIUM 8.2* 8.4 8.8 8.6 8.4 --  MG -- -- -- -- -- 2.0  PHOS -- -- -- -- -- --   Liver Function Tests:  Lab 01/25/12 0500 01/24/12 0509 01/23/12 0405 01/22/12 0300 01/21/12 0340  AST 37 52* 76* 110* 135*  ALT 78* 102* 118* 133* 131*  ALKPHOS 43 47 44 48 48  BILITOT 0.3 0.4 0.4 0.4 0.4  PROT 5.9* 6.6 6.2 6.6 6.8  ALBUMIN 2.8* 3.3*  3.1* 3.3* 3.5    Lab 01/21/12 0340 01/20/12 0410  LIPASE 62* 44  AMYLASE -- --   No results found for this basename: AMMONIA:5 in the last 168 hours CBC:  Lab 01/26/12 0410 01/25/12 0500 01/23/12 0405 01/21/12 0340 01/20/12 0410  WBC 6.6 4.5 5.6 9.1 7.6  NEUTROABS -- -- -- -- --  HGB 12.0 11.8* 12.4 12.9 13.8  HCT 35.0* 35.3* 36.2 37.9 40.2  MCV 93.1 93.9 93.3 93.1 92.2  PLT 275 285 313 356 366   Scheduled Meds:   . azelastine  1 spray Each Nare BID  . docusate sodium  100 mg Oral BID  . enoxaparin (LOVENOX) injection  40 mg Subcutaneous Q24H  . fluticasone  2 spray Each Nare Daily  . gi cocktail  30 mL Oral TID  . LORazepam  1 mg Intramuscular Once  . lubiprostone  24 mcg Oral BID WC  . metoCLOPramide  10 mg Oral TID AC & HS  . montelukast  10 mg Oral QHS  . pantoprazole (PROTONIX) IV  40 mg Intravenous Q12H  . polyethylene glycol  17 g Oral Daily  . saccharomyces boulardii  250 mg Oral BID   Continuous Infusions:   . 0.9 % NaCl with KCl 20 mEq / L 125 mL/hr (01/25/12 2327)     Debbora Presto, MD  [facility] Pager [phone]  If 7PM-7AM, please contact night-coverage [email or web address] Password [facility] 01/26/2012, 4:29 PM   LOS: 12 days

## 2012-01-27 LAB — COMPREHENSIVE METABOLIC PANEL
ALT: 55 U/L — ABNORMAL HIGH (ref 0–35)
Albumin: 2.9 g/dL — ABNORMAL LOW (ref 3.5–5.2)
Alkaline Phosphatase: 41 U/L (ref 39–117)
Potassium: 3.6 mEq/L (ref 3.5–5.1)
Sodium: 132 mEq/L — ABNORMAL LOW (ref 135–145)
Total Protein: 5.8 g/dL — ABNORMAL LOW (ref 6.0–8.3)

## 2012-01-27 LAB — CBC
MCHC: 33.7 g/dL (ref 30.0–36.0)
Platelets: 262 10*3/uL (ref 150–400)
RDW: 12.6 % (ref 11.5–15.5)

## 2012-01-27 MED ORDER — PROMETHAZINE HCL 12.5 MG PO TABS: 25.0000 mg | ORAL_TABLET | Freq: Four times a day (QID) | ORAL | Status: DC | PRN

## 2012-01-27 MED ORDER — LORAZEPAM 1 MG PO TABS: 1.0000 mg | ORAL_TABLET | Freq: Three times a day (TID) | ORAL | Status: DC | PRN

## 2012-01-27 MED ORDER — GI COCKTAIL ~~LOC~~: 30.0000 mL | Freq: Three times a day (TID) | ORAL | Status: DC

## 2012-01-27 MED ORDER — METOCLOPRAMIDE HCL 10 MG PO TABS: 10.0000 mg | ORAL_TABLET | Freq: Three times a day (TID) | ORAL | Status: DC

## 2012-01-27 MED ORDER — HYDROCODONE-ACETAMINOPHEN 5-500 MG PO TABS: 1.0000 | ORAL_TABLET | Freq: Four times a day (QID) | ORAL | Status: DC | PRN

## 2012-01-27 MED ORDER — SACCHAROMYCES BOULARDII 250 MG PO CAPS: 250.0000 mg | ORAL_CAPSULE | Freq: Two times a day (BID) | ORAL | Status: DC

## 2012-01-27 NOTE — Discharge Summary (Signed)
Physician Discharge Summary  Mandy Hanson [id number]:[id number] DOB: 11/12/1950 DOA: 01/14/2012  PCP: Allean Found, MD  Admit date: 01/14/2012 Discharge date: 01/27/2012  Recommendations for Outpatient Follow-up:  1. Pt will need to follow up with PCP in 2-3 weeks post discharge 2. Please obtain BMP to evaluate electrolytes and kidney function 3. Please also check CBC to evaluate Hg and Hct levels 4. Please note that pt was started on Reglan, see details under medication section 5. Please note that pt has completed the treatment with antibiotics and no need for continuation upon discharge   Discharge Diagnoses: acute diverticulitis  Principal Problem:  *Diverticulitis of colon (without mention of hemorrhage) Active Problems:  Hyponatremia  GERD (gastroesophageal reflux disease)  Nausea and vomiting in adult  Hypokalemia  Transaminitis  Epigastric abdominal pain  Discharge Condition: Stable  Diet recommendation: Heart healthy diet discussed in details   Brief narrative:  62 y.o. female with history of GERD, esophagitis, and diverticulosis who presented with abdominal pain. She stated that she began having lower abdominal cramping pain 2 days prior to admission, and then she developed nausea and vomiting which has been persistent. She stated that the vomitus was nonbloody. She denied diarrhea, fevers, and no melena. She was seen in the ED and a CT scan of her abdomen and pelvis showed findings concerning for acute diverticulitis of the sigmoid colon. No definite diverticular abscess or findings to suggest frank perforation. Her white cell count was 12.5. She was given doses of antiemetics in the ED but continued to vomit, she states Zofran doesn't help-but also so far not relieved with Phenergan. She was admitted for further evaluation and management.   Principal Problem:  *Diverticulitis of colon (without mention of hemorrhage)  - appears to be nearly resolved at this point  - EGD  normal so etiology remains unclear at this point  - discontinue Ciprofloxacin as per GI recommendations and will plan on adding GI cocktail to see if it helps with symptoms  Active Problems:  Hyponatremia  - remains at baseline and slightly improved since yesterday  GERD (gastroesophageal reflux disease)  - continue Protonix but will transition to PO today prior to discharge  Nausea and vomiting in adult  - unclear etiology at this time but appears to be resolved per pt - EGD normal  - ? Need for gastric emptying study and perhaps can be done in an outpatient setting  Hypokalemia  - secondary to vomiting  - within normal limits this time  Transaminitis  - continuing to trend down  - CMET indicating improvement in LFT's  Consultants:  GI Procedures/Studies:  EGD 01/25/2012 - Dr. Madilyn Fireman - normal EGD  Ct Abdomen Pelvis W Contrast  01/24/2012  Near complete resolution of sigmoid diverticulitis since prior exam. No evidence of abscess or other acute findings.  Antibiotics:  Cipro 1/11--> 1/23  Metronidazole 1/11--> 1/17  Code Status: Full  Family Communication: Pt at bedside   Discharge Exam: Filed Vitals:   01/27/12 0535  BP: 92/50  Pulse: 74  Temp: 97.3 F (36.3 C)  Resp: 16   Filed Vitals:   01/26/12 0605 01/26/12 1400 01/26/12 2146 01/27/12 0535  BP: 92/55 148/76 97/52 92/50   Pulse: 81 96 102 74  Temp: 98.5 F (36.9 C) 98.4 F (36.9 C) 97.5 F (36.4 C) 97.3 F (36.3 C)  TempSrc: Oral Oral Oral Oral  Resp: 16 18 18 16   Height:      Weight:      SpO2: 98% 100% 97%  99%    General: Pt is alert, follows commands appropriately, not in acute distress Cardiovascular: Regular rate and rhythm, S1/S2 +, no murmurs, no rubs, no gallops Respiratory: Clear to auscultation bilaterally, no wheezing, no crackles, no rhonchi Abdominal: Soft, non tender, non distended, bowel sounds +, no guarding Extremities: no edema, no cyanosis, pulses palpable bilaterally DP and PT Neuro:  Grossly nonfocal  Discharge Instructions     Medication List     As of 01/27/2012  9:36 AM    STOP taking these medications         ondansetron 4 MG tablet   Commonly known as: ZOFRAN      TAKE these medications         azelastine 137 MCG/SPRAY nasal spray   Commonly known as: ASTELIN   Place 1 spray into the nose 2 (two) times daily. Use in each nostril as directed      calcium carbonate 600 MG Tabs   Commonly known as: OS-CAL   Take 600 mg by mouth daily.      gi cocktail Susp suspension   Take 30 mLs by mouth 3 (three) times daily. Shake well.      HYDROcodone-acetaminophen 5-500 MG per tablet   Commonly known as: VICODIN   Take 1 tablet by mouth every 6 (six) hours as needed for pain.      LORazepam 1 MG tablet   Commonly known as: ATIVAN   Take 1 tablet (1 mg total) by mouth 3 (three) times daily as needed for anxiety.      lubiprostone 24 MCG capsule   Commonly known as: AMITIZA   Take 24 mcg by mouth 2 (two) times daily with a meal.      metoCLOPramide 10 MG tablet   Commonly known as: REGLAN   Take 1 tablet (10 mg total) by mouth 4 (four) times daily -  before meals and at bedtime.      mometasone 50 MCG/ACT nasal spray   Commonly known as: NASONEX   Place 2 sprays into the nose daily as needed. Allergies      montelukast 10 MG tablet   Commonly known as: SINGULAIR   Take 10 mg by mouth at bedtime.      promethazine 12.5 MG tablet   Commonly known as: PHENERGAN   Take 2 tablets (25 mg total) by mouth every 6 (six) hours as needed for nausea.      saccharomyces boulardii 250 MG capsule   Commonly known as: FLORASTOR   Take 1 capsule (250 mg total) by mouth 2 (two) times daily.           Follow-up Information    Follow up with Allean Found, MD. In 3 weeks.   Contact information:   [address] [place] [address] [phone]       Follow up with HAYES,JOHN C, MD. In 4 weeks.   Contact information:   [address]  [address]                         [place] [address] [phone]           The results of significant diagnostics from this hospitalization (including imaging, microbiology, ancillary and laboratory) are listed below for reference.     Microbiology: No results found for this or any previous visit (from the past 240 hour(s)).   Labs: Basic Metabolic Panel:  Lab 01/27/12 4401 01/26/12 0410  01/25/12 0500 01/24/12 0509 01/23/12 0405 01/21/12 0340  NA 132* 131* 130* 134* 130* --  K 3.6 3.9 3.6 3.9 3.8 --  CL 99 99 98 99 96 --  CO2 26 23 24 25 24  --  GLUCOSE 91 93 114* 102* 122* --  BUN 6 4* 4* 4* 3* --  CREATININE 0.68 0.65 0.65 0.65 0.60 --  CALCIUM 8.4 8.2* 8.4 8.8 8.6 --  MG -- -- -- -- -- 2.0  PHOS -- -- -- -- -- --   Liver Function Tests:  Lab 01/27/12 0610 01/25/12 0500 01/24/12 0509 01/23/12 0405 01/22/12 0300  AST 27 37 52* 76* 110*  ALT 55* 78* 102* 118* 133*  ALKPHOS 41 43 47 44 48  BILITOT 0.3 0.3 0.4 0.4 0.4  PROT 5.8* 5.9* 6.6 6.2 6.6  ALBUMIN 2.9* 2.8* 3.3* 3.1* 3.3*    Lab 01/21/12 0340  LIPASE 62*  AMYLASE --   CBC:  Lab 01/27/12 0610 01/26/12 0410 01/25/12 0500 01/23/12 0405 01/21/12 0340  WBC 4.4 6.6 4.5 5.6 9.1  NEUTROABS -- -- -- -- --  HGB 11.4* 12.0 11.8* 12.4 12.9  HCT 33.8* 35.0* 35.3* 36.2 37.9  MCV 93.6 93.1 93.9 93.3 93.1  PLT 262 275 285 313 356    SIGNED: Time coordinating discharge: Over 30 minutes  Debbora Presto, MD  [facility] 01/27/2012, 9:36 AM Pager [phone]  If 7PM-7AM, please contact night-coverage [email or web address] Password [email or web address]

## 2012-01-28 LAB — BASIC METABOLIC PANEL
BUN: 9 mg/dL (ref 6–23)
Chloride: 102 mEq/L (ref 96–112)
Creatinine, Ser: 0.69 mg/dL (ref 0.50–1.10)
GFR calc Af Amer: >90 mL/min (ref >90)

## 2012-01-28 LAB — CBC
HCT: 32.7 % — ABNORMAL LOW (ref 36.0–46.0)
MCHC: 33.6 g/dL (ref 30.0–36.0)
MCV: 94 fL (ref 78.0–100.0)
RDW: 12.6 % (ref 11.5–15.5)
WBC: 4.3 10*3/uL (ref 4.0–10.5)

## 2012-01-28 MED ORDER — PANTOPRAZOLE SODIUM 40 MG PO TBEC
40.0000 mg | DELAYED_RELEASE_TABLET | Freq: Two times a day (BID) | ORAL | Status: DC
  Administered 2012-01-28 – 2012-01-31 (×6): 40 mg via ORAL
  Filled 2012-01-28 (×9): qty 1

## 2012-01-28 MED ORDER — SODIUM CHLORIDE 0.9 % IV BOLUS (SEPSIS)
500.0000 mL | Freq: Once | INTRAVENOUS | Status: AC
  Administered 2012-01-28: 500 mL via INTRAVENOUS

## 2012-01-28 MED ORDER — SODIUM CHLORIDE 0.9 % IV BOLUS (SEPSIS)
1000.0000 mL | Freq: Once | INTRAVENOUS | Status: AC
  Administered 2012-01-28: 1000 mL via INTRAVENOUS

## 2012-01-28 NOTE — Progress Notes (Signed)
This patient is receiving IV Protonix. Based on criteria approved by the Pharmacy and Therapeutics Committee, this medication is being converted to the equivalent oral dose form. These criteria include:   . The patient is eating (either orally or per tube) and/or has been taking other orally administered medications for at least 24 hours.  . This patient has no evidence of active gastrointestinal bleeding or impaired GI absorption (gastrectomy, short bowel, patient on TNA or NPO).   If you have questions about this conversion, please contact the pharmacy department.  Clydene Fake,  [facility] 01/28/2012 9:42 AM

## 2012-01-28 NOTE — Progress Notes (Signed)
Patient ID: Mandy Hanson, female   DOB: [birth year], 62 y.o.   MRN: [id number]  [facility] PROGRESS NOTE  [name] HYQ:[id number] DOB: 20-Nov-1950 DOA: 01/14/2012 PCP: Allean Found, MD  Brief narrative:  62 y.o. female with history of GERD, esophagitis, and diverticulosis who presented with abdominal pain. She stated that she began having lower abdominal cramping pain 2 days prior to admission, and then she developed nausea and vomiting which has been persistent. She stated that the vomitus was nonbloody. She denied diarrhea, fevers, and no melena. She was seen in the ED and a CT scan of her abdomen and pelvis showed findings concerning for acute diverticulitis of the sigmoid colon. No definite diverticular abscess or findings to suggest frank perforation. Her white cell count was 12.5. She was given doses of antiemetics in the ED but continued to vomit, she states Zofran doesn't help-but also so far not relieved with Phenergan. She was admitted for further evaluation and management.   Principal Problem:  *Diverticulitis of colon (without mention of hemorrhage)  - appears to be nearly resolved at this point  - EGD normal so etiology remains unclear at this point  - discontinued Ciprofloxacin as per GI recommendations Active Problems:  Hyponatremia  - remains at baseline and slightly improved since yesterday  GERD (gastroesophageal reflux disease)  - continue Protonix but will transition to PO today prior to discharge  Nausea and vomiting in adult  - unclear etiology at this time, pt still slightly nauseated this AM - EGD normal  - ? Need for gastric emptying study and perhaps can be done in an outpatient setting  - continue Reglan, GI cocktail  Hypokalemia  - secondary to vomiting  - within normal limits this AM Transaminitis  - continuing to trend down  - CMET indicating improvement in LFT's   Consultants:  GI Procedures/Studies:  EGD 01/25/2012 - Dr. Madilyn Fireman -  normal EGD  Ct Abdomen Pelvis W Contrast  01/24/2012  Near complete resolution of sigmoid diverticulitis since prior exam. No evidence of abscess or other acute findings.  Antibiotics:  Cipro 1/11--> 1/23  Metronidazole 1/11--> 1/17  HPI/Subjective: No events overnight.   Objective: Filed Vitals:   01/27/12 2200 01/28/12 0100 01/28/12 0325 01/28/12 0615  BP: 86/48 85/47 81/48  88/56  Pulse: 83 74 68 62  Temp: 97.7 F (36.5 C) 97.4 F (36.3 C) 97.7 F (36.5 C) 97.9 F (36.6 C)  TempSrc: Oral Oral Oral Oral  Resp: 18 16 14 16   Height:      Weight:      SpO2: 97% 92% 98% 97%    Intake/Output Summary (Last 24 hours) at 01/28/12 1150 Last data filed at 01/28/12 0500  Gross per 24 hour  Intake 415.83 ml  Output   1800 ml  Net -1384.17 ml    Exam:   General:  Pt is alert, follows commands appropriately, not in acute distress  Cardiovascular: Regular rate and rhythm, S1/S2, no murmurs, no rubs, no gallops  Respiratory: Clear to auscultation bilaterally, no wheezing, no crackles, no rhonchi  Abdomen: Soft, non tender, non distended, bowel sounds present, no guarding  Extremities: No edema, pulses DP and PT palpable bilaterally  Neuro: Grossly nonfocal  Data Reviewed: Basic Metabolic Panel:  Lab 01/28/12 9528 01/27/12 0610 01/26/12 0410 01/25/12 0500 01/24/12 0509  NA 131* 132* 131* 130* 134*  K 3.6 3.6 3.9 3.6 3.9  CL 102 99 99 98 99  CO2 24 26 23 24 25   GLUCOSE 92 91  93 114* 102*  BUN 9 6 4* 4* 4*  CREATININE 0.69 0.68 0.65 0.65 0.65  CALCIUM 7.7* 8.4 8.2* 8.4 8.8  MG -- -- -- -- --  PHOS -- -- -- -- --   Liver Function Tests:  Lab 01/27/12 0610 01/25/12 0500 01/24/12 0509 01/23/12 0405 01/22/12 0300  AST 27 37 52* 76* 110*  ALT 55* 78* 102* 118* 133*  ALKPHOS 41 43 47 44 48  BILITOT 0.3 0.3 0.4 0.4 0.4  PROT 5.8* 5.9* 6.6 6.2 6.6  ALBUMIN 2.9* 2.8* 3.3* 3.1* 3.3*   CBC:  Lab 01/28/12 0535 01/27/12 0610 01/26/12 0410 01/25/12 0500 01/23/12 0405    WBC 4.3 4.4 6.6 4.5 5.6  NEUTROABS -- -- -- -- --  HGB 11.0* 11.4* 12.0 11.8* 12.4  HCT 32.7* 33.8* 35.0* 35.3* 36.2  MCV 94.0 93.6 93.1 93.9 93.3  PLT 273 262 275 285 313     Scheduled Meds:   . azelastine  1 spray Each Nare BID  . docusate sodium  100 mg Oral BID  . enoxaparin (LOVENOX) injection  40 mg Subcutaneous Q24H  . fluticasone  2 spray Each Nare Daily  . gi cocktail  30 mL Oral TID  . LORazepam  1 mg Intramuscular Once  . lubiprostone  24 mcg Oral BID WC  . metoCLOPramide  10 mg Oral TID AC & HS  . montelukast  10 mg Oral QHS  . pantoprazole  40 mg Oral BID  . polyethylene glycol  17 g Oral Daily  . saccharomyces boulardii  250 mg Oral BID   Continuous Infusions:   . 0.9 % NaCl with KCl 20 mEq / L 50 mL/hr at 01/28/12 0900     Debbora Presto, MD  [facility] Pager [phone]  If 7PM-7AM, please contact night-coverage [email or web address] Password [email or web address] 01/28/2012, 11:50 AM   LOS: 14 days

## 2012-01-28 NOTE — Progress Notes (Signed)
B/P 81/48, P 68 R 14 T  97.7  O2 SAT 98,M Lynch notified and order received for 1000cc NS bolus.

## 2012-01-28 NOTE — Progress Notes (Signed)
B/P 85/47, R 16  P 74 O2 SAT 92 - M Lynch notified and orders received for 500 cc N/S bolus.

## 2012-01-29 LAB — CBC
HCT: 36.8 % (ref 36.0–46.0)
MCHC: 34 g/dL (ref 30.0–36.0)
MCV: 93.6 fL (ref 78.0–100.0)
RDW: 12.3 % (ref 11.5–15.5)

## 2012-01-29 LAB — BASIC METABOLIC PANEL
BUN: 6 mg/dL (ref 6–23)
Calcium: 8.8 mg/dL (ref 8.4–10.5)
Creatinine, Ser: 0.74 mg/dL (ref 0.50–1.10)
GFR calc Af Amer: >90 mL/min (ref >90)
GFR calc non Af Amer: >90 mL/min (ref >90)
Potassium: 3.5 mEq/L (ref 3.5–5.1)

## 2012-01-29 NOTE — Progress Notes (Signed)
Patient ID: Mandy Hanson, female   DOB: 1950/12/18, 62 y.o.   MRN: [id number]  [facility] PROGRESS NOTE  Mandy Hanson WUJ:[id number] DOB: December 02, 1950 DOA: 01/14/2012 PCP: Allean Found, MD   Brief narrative:  62 y.o. female with history of GERD, esophagitis, and diverticulosis who presented with abdominal pain. She stated that she began having lower abdominal cramping pain 2 days prior to admission, and then she developed nausea and vomiting which has been persistent. She stated that the vomitus was nonbloody. She denied diarrhea, fevers, and no melena. She was seen in the ED and a CT scan of her abdomen and pelvis showed findings concerning for acute diverticulitis of the sigmoid colon. No definite diverticular abscess or findings to suggest frank perforation. Her white cell count was 12.5. She was given doses of antiemetics in the ED but continued to vomit, she states Zofran doesn't help-but also so far not relieved with Phenergan. She was admitted for further evaluation and management.   Principal Problem:  *Diverticulitis of colon (without mention of hemorrhage)  - resolved at this point  - EGD normal so etiology remains unclear at this point  - discontinued Ciprofloxacin  Active Problems:  Hyponatremia  - remains at baseline GERD (gastroesophageal reflux disease)  - continue Protonix PO  Nausea and vomiting in adult  - unclear etiology at this time, denies nausea this AM - EGD normal  - continue Reglan, GI cocktail  Hypokalemia  - secondary to vomiting  - within normal limits this AM  Transaminitis  - continuing to trend down  - CMET indicating improvement in LFT's   Consultants:  GI Procedures/Studies:  EGD 01/25/2012 - Dr. Madilyn Fireman - normal EGD  Ct Abdomen Pelvis W Contrast  01/24/2012  Near complete resolution of sigmoid diverticulitis since prior exam. No evidence of abscess or other acute findings.  Antibiotics:  Cipro 1/11--> 1/23  Metronidazole 1/11-->  1/17  HPI/Subjective: No events overnight.   Objective: Filed Vitals:   01/28/12 0615 01/28/12 1349 01/28/12 2148 01/29/12 0520  BP: 88/56 145/72 107/69 90/57  Pulse: 62 92 101 68  Temp: 97.9 F (36.6 C) 99.2 F (37.3 C) 99.1 F (37.3 C) 97.8 F (36.6 C)  TempSrc: Oral Oral Oral Oral  Resp: 16 16 18 18   Height:      Weight:      SpO2: 97% 100% 94% 96%    Intake/Output Summary (Last 24 hours) at 01/29/12 1037 Last data filed at 01/29/12 9562  Gross per 24 hour  Intake 3207.5 ml  Output   3150 ml  Net   57.5 ml    Exam:   General:  Pt is alert, follows commands appropriately, not in acute distress  Cardiovascular: Regular rate and rhythm, S1/S2, no murmurs, no rubs, no gallops  Respiratory: Clear to auscultation bilaterally, no wheezing, no crackles, no rhonchi  Abdomen: Soft, non tender, non distended, bowel sounds present, no guarding  Extremities: No edema, pulses DP and PT palpable bilaterally  Neuro: Grossly nonfocal  Data Reviewed: Basic Metabolic Panel:  Lab 01/29/12 1308 01/28/12 0535 01/27/12 0610 01/26/12 0410 01/25/12 0500  NA 131* 131* 132* 131* 130*  K 3.5 3.6 3.6 3.9 3.6  CL 97 102 99 99 98  CO2 26 24 26 23 24   GLUCOSE 84 92 91 93 114*  BUN 6 9 6  4* 4*  CREATININE 0.74 0.69 0.68 0.65 0.65  CALCIUM 8.8 7.7* 8.4 8.2* 8.4  MG -- -- -- -- --  PHOS -- -- -- -- --  Liver Function Tests:  Lab 01/27/12 0610 01/25/12 0500 01/24/12 0509 01/23/12 0405  AST 27 37 52* 76*  ALT 55* 78* 102* 118*  ALKPHOS 41 43 47 44  BILITOT 0.3 0.3 0.4 0.4  PROT 5.8* 5.9* 6.6 6.2  ALBUMIN 2.9* 2.8* 3.3* 3.1*   No results found for this basename: LIPASE:5,AMYLASE:5 in the last 168 hours No results found for this basename: AMMONIA:5 in the last 168 hours CBC:  Lab 01/29/12 0540 01/28/12 0535 01/27/12 0610 01/26/12 0410 01/25/12 0500  WBC 5.7 4.3 4.4 6.6 4.5  NEUTROABS -- -- -- -- --  HGB 12.5 11.0* 11.4* 12.0 11.8*  HCT 36.8 32.7* 33.8* 35.0* 35.3*  MCV  93.6 94.0 93.6 93.1 93.9  PLT 329 273 262 275 285   Scheduled Meds:   . azelastine  1 spray Each Nare BID  . docusate sodium  100 mg Oral BID  . enoxaparin (LOVENOX) injection  40 mg Subcutaneous Q24H  . fluticasone  2 spray Each Nare Daily  . gi cocktail  30 mL Oral TID  . LORazepam  1 mg Intramuscular Once  . lubiprostone  24 mcg Oral BID WC  . metoCLOPramide  10 mg Oral TID AC & HS  . montelukast  10 mg Oral QHS  . pantoprazole  40 mg Oral BID  . polyethylene glycol  17 g Oral Daily  . saccharomyces boulardii  250 mg Oral BID   Continuous Infusions:   . 0.9 % NaCl with KCl 20 mEq / L 50 mL/hr at 01/29/12 9811     Debbora Presto, MD  [facility] Pager [phone]  If 7PM-7AM, please contact night-coverage [email or web address] Password [facility] 01/29/2012, 10:37 AM   LOS: 15 days

## 2012-01-30 ENCOUNTER — Ambulatory Visit (HOSPITAL_COMMUNITY): Payer: Managed Care, Other (non HMO)

## 2012-01-30 MED ORDER — IOHEXOL 300 MG/ML  SOLN
25.0000 mL | INTRAMUSCULAR | Status: AC
  Administered 2012-01-30: 25 mL via ORAL

## 2012-01-30 MED ORDER — SODIUM CHLORIDE 0.9 % IV BOLUS (SEPSIS)
1000.0000 mL | Freq: Once | INTRAVENOUS | Status: AC
  Administered 2012-01-30: 1000 mL via INTRAVENOUS

## 2012-01-30 MED ORDER — IOHEXOL 300 MG/ML  SOLN
100.0000 mL | Freq: Once | INTRAMUSCULAR | Status: AC | PRN
  Administered 2012-01-30: 100 mL via INTRAVENOUS

## 2012-01-30 NOTE — Progress Notes (Signed)
Nutrition Brief Note  Body mass index is 23.57 kg/(m^2). Patient meets criteria for healthy weight based on current BMI.   Pt with nausea/vomiting that started 2 days PTA. Pt denies any weight loss. Pt found to have acute diverticulitis. Pt's nausea persisted and upper endoscopy was performed on 1/22 however found to be normal per MD notes. Yesterday pt had some emesis a few hours after eating breakfast. Met with pt this morning who stated she did well with her breakfast this morning and kept it down and only had very slight amount of nausea. Bland diet therapy discussed and diet handouts provided. Per RN, pt may be discharged today.   No further nutrition interventions warranted at this time. If nutrition issues arise, please consult RD.   Levon Hedger MS, RD, LDN [phone] Pager [phone] After Hours Pager

## 2012-01-30 NOTE — Progress Notes (Signed)
Patient ID: Mandy Hanson, female   DOB: 1950/10/12, 62 y.o.   MRN: [id number]  [facility] PROGRESS NOTE  Mandy Hanson:[id number] DOB: [birth year] DOA: 01/14/2012 PCP: Allean Found, MD  Brief narrative:  62 y.o. female with history of GERD, esophagitis, and diverticulosis who presented with abdominal pain. She stated that she began having lower abdominal cramping pain 2 days prior to admission, and then she developed nausea and vomiting which has been persistent. She stated that the vomitus was nonbloody. She denied diarrhea, fevers, and no melena. She was seen in the ED and a CT scan of her abdomen and pelvis showed findings concerning for acute diverticulitis of the sigmoid colon. No definite diverticular abscess or findings to suggest frank perforation. Her white cell count was 12.5. She was given doses of antiemetics in the ED but continued to vomit, she states Zofran doesn't help-but also so far not relieved with Phenergan. She was admitted for further evaluation and management.   Principal Problem:  *Diverticulitis of colon (without mention of hemorrhage)  - resolved at this point but pt with persistent vomiting - EGD normal so etiology remains unclear at this point  - discontinued Ciprofloxacin  - plan on obtaining CT abdomen today Active Problems:  Hyponatremia  - remains at baseline  - will provide additional NS bolus 1 L as oral intake still poor due to vomiting  GERD (gastroesophageal reflux disease)  - continue Protonix PO  Nausea and vomiting in adult  - unclear etiology at this time, reports vomiting last night as well - EGD normal  - continue Reglan, GI cocktail  - repeat CT abdomen today  Hypokalemia  - secondary to vomiting  - within normal limits this AM  Transaminitis  - continuing to trend down  - CMET indicating improvement in LFT's  - CMET in AM  Consultants:  GI Procedures/Studies:  EGD 01/25/2012 - Dr. Madilyn Fireman - normal EGD  Ct Abdomen  Pelvis W Contrast  01/24/2012  Near complete resolution of sigmoid diverticulitis since prior exam. No evidence of abscess or other acute findings.  Antibiotics:  Cipro 1/11--> 1/23  Metronidazole 1/11--> 1/17  HPI/Subjective: No events overnight.   Objective: Filed Vitals:   01/29/12 0520 01/29/12 1344 01/29/12 2207 01/30/12 0518  BP: 90/57 134/80 87/59 85/57   Pulse: 68 106 83 65  Temp: 97.8 F (36.6 C) 98.4 F (36.9 C) 98.1 F (36.7 C) 97.7 F (36.5 C)  TempSrc: Oral Oral Oral Oral  Resp: 18 16 16 16   Height:      Weight:      SpO2: 96% 96% 99% 97%    Intake/Output Summary (Last 24 hours) at 01/30/12 1319 Last data filed at 01/30/12 1000  Gross per 24 hour  Intake 1059.99 ml  Output    750 ml  Net 309.99 ml    Exam:   General:  Pt is alert, follows commands appropriately, not in acute distress  Cardiovascular: Regular rate and rhythm, S1/S2, no murmurs, no rubs, no gallops  Respiratory: Clear to auscultation bilaterally, no wheezing, no crackles, no rhonchi  Abdomen: Soft, non tender, non distended, bowel sounds present, no guarding  Extremities: No edema, pulses DP and PT palpable bilaterally  Neuro: Grossly nonfocal  Data Reviewed: Basic Metabolic Panel:  Lab 01/29/12 9562 01/28/12 0535 01/27/12 0610 01/26/12 0410 01/25/12 0500  NA 131* 131* 132* 131* 130*  K 3.5 3.6 3.6 3.9 3.6  CL 97 102 99 99 98  CO2 26 24 26 23  24  GLUCOSE 84 92 91 93 114*  BUN 6 9 6  4* 4*  CREATININE 0.74 0.69 0.68 0.65 0.65  CALCIUM 8.8 7.7* 8.4 8.2* 8.4  MG -- -- -- -- --  PHOS -- -- -- -- --   Liver Function Tests:  Lab 01/27/12 0610 01/25/12 0500 01/24/12 0509  AST 27 37 52*  ALT 55* 78* 102*  ALKPHOS 41 43 47  BILITOT 0.3 0.3 0.4  PROT 5.8* 5.9* 6.6  ALBUMIN 2.9* 2.8* 3.3*   CBC:  Lab 01/29/12 0540 01/28/12 0535 01/27/12 0610 01/26/12 0410 01/25/12 0500  WBC 5.7 4.3 4.4 6.6 4.5  NEUTROABS -- -- -- -- --  HGB 12.5 11.0* 11.4* 12.0 11.8*  HCT 36.8 32.7* 33.8*  35.0* 35.3*  MCV 93.6 94.0 93.6 93.1 93.9  PLT 329 273 262 275 285    Scheduled Meds:   . azelastine  1 spray Each Nare BID  . docusate sodium  100 mg Oral BID  . enoxaparin injection  40 mg Subcutaneous Q24H  . fluticasone  2 spray Each Nare Daily  . gi cocktail  30 mL Oral TID  . LORazepam  1 mg Intramuscular Once  . lubiprostone  24 mcg Oral BID WC  . metoCLOPramide  10 mg Oral TID AC & HS  . montelukast  10 mg Oral QHS  . pantoprazole  40 mg Oral BID  . polyethylene glycol  17 g Oral Daily  . saccharomyces boulardii  250 mg Oral BID   Continuous Infusions:   . 0.9 % NaCl with KCl 20 mEq / L 50 mL/hr at 01/29/12 2149     Debbora Presto, MD  [facility] Pager [phone]  If 7PM-7AM, please contact night-coverage [email or web address] Password [email or web address] 01/30/2012, 1:19 PM   LOS: 16 days

## 2012-01-31 LAB — CBC
Hemoglobin: 12.7 g/dL (ref 12.0–15.0)
MCH: 32 pg (ref 26.0–34.0)
MCV: 92.2 fL (ref 78.0–100.0)
RBC: 3.97 MIL/uL (ref 3.87–5.11)

## 2012-01-31 LAB — COMPREHENSIVE METABOLIC PANEL
AST: 23 U/L (ref 0–37)
Albumin: 3.2 g/dL — ABNORMAL LOW (ref 3.5–5.2)
Calcium: 8.7 mg/dL (ref 8.4–10.5)
Creatinine, Ser: 0.65 mg/dL (ref 0.50–1.10)
Total Protein: 6.1 g/dL (ref 6.0–8.3)

## 2012-01-31 NOTE — Discharge Summary (Signed)
Physician Discharge Summary  Mandy Hanson ZOX:[id number] DOB: November 13, 1950 DOA: 01/14/2012  PCP: Allean Found, MD  Admit date: 01/14/2012 Discharge date: 01/31/2012  Recommendations for Outpatient Follow-up:  1. Pt will need to follow up with PCP in 2-3 weeks post discharge 2. Please obtain BMP to evaluate electrolytes and kidney function 3. Please also check CBC to evaluate Hg and Hct levels 4. I have scheduled follow up appointment with Dr. Madilyn Fireman next week, pt made aware   Discharge Diagnoses:  *Diverticulitis of colon (without mention of hemorrhage) Principal Problem:  *Diverticulitis of colon (without mention of hemorrhage) Active Problems:  Hyponatremia  GERD (gastroesophageal reflux disease)  Nausea and vomiting in adult  Hypokalemia  Transaminitis  Epigastric abdominal pain  Discharge Condition: Stable  Diet recommendation: Heart healthy diet discussed in details   Brief narrative:  62 y.o. female with history of GERD, esophagitis, and diverticulosis who presented with abdominal pain. She stated that she began having lower abdominal cramping pain 2 days prior to admission, and then she developed nausea and vomiting which has been persistent. She stated that the vomitus was nonbloody. She denied diarrhea, fevers, and no melena. She was seen in the ED and a CT scan of her abdomen and pelvis showed findings concerning for acute diverticulitis of the sigmoid colon. No definite diverticular abscess or findings to suggest frank perforation. Her white cell count was 12.5. She was given doses of antiemetics in the ED but continued to vomit, she states Zofran doesn't help-but also so far not relieved with Phenergan. She was admitted for further evaluation and management.   Principal Problem:  *Diverticulitis of colon (without mention of hemorrhage)  - resolved at this point and with no vomiting over 24 hour period  - EGD normal so etiology remains unclear at this point  -  discontinued Ciprofloxacin  - repeat Ct abdomen unremarkable and diverticulitis resolved Active Problems:  Hyponatremia  - remains at baseline  - hydration provided with NS and pt responded well GERD (gastroesophageal reflux disease)  - continue Protonix PO  Nausea and vomiting in adult  - unclear etiology at this time, now resolved  - EGD normal  - continue Reglan, GI cocktail  - repeat CT abdomen unremarkable  Hypokalemia  - secondary to vomiting  - within normal limits this AM  Transaminitis  - continuing to trend down  - CMET indicating improvement in LFT's   Consultants:  GI  Procedures/Studies:  EGD 01/25/2012 - Dr. Madilyn Fireman - normal EGD  Ct Abdomen Pelvis W Contrast  01/24/2012  Near complete resolution of sigmoid diverticulitis since prior exam. No evidence of abscess or other acute findings.   Antibiotics:  Cipro 1/11--> 1/23  Metronidazole 1/11--> 1/17  Discharge Exam: Filed Vitals:   01/31/12 0545  BP: 98/61  Pulse: 68  Temp: 98.6 F (37 C)  Resp: 16   Filed Vitals:   01/30/12 0518 01/30/12 1400 01/30/12 2201 01/31/12 0545  BP: 85/57 164/80 98/60 98/61   Pulse: 65 96 82 68  Temp: 97.7 F (36.5 C) 99 F (37.2 C) 98.7 F (37.1 C) 98.6 F (37 C)  TempSrc: Oral Oral Oral Oral  Resp: 16 16 16 16   Height:      Weight:      SpO2: 97% 97% 97% 97%    General: Pt is alert, follows commands appropriately, not in acute distress Cardiovascular: Regular rate and rhythm, S1/S2 +, no murmurs, no rubs, no gallops Respiratory: Clear to auscultation bilaterally, no wheezing, no crackles, no  rhonchi Abdominal: Soft, non tender, non distended, bowel sounds +, no guarding Extremities: no edema, no cyanosis, pulses palpable bilaterally DP and PT Neuro: Grossly nonfocal  Discharge Instructions  Discharge Orders    Future Orders Please Complete By Expires   Diet - low sodium heart healthy      Diet - low sodium heart healthy      Diet - low sodium heart healthy        Increase activity slowly      Increase activity slowly      Increase activity slowly          Medication List     As of 01/31/2012 10:13 AM    STOP taking these medications         ondansetron 4 MG tablet   Commonly known as: ZOFRAN      TAKE these medications         azelastine 137 MCG/SPRAY nasal spray   Commonly known as: ASTELIN   Place 1 spray into the nose 2 (two) times daily. Use in each nostril as directed      calcium carbonate 600 MG Tabs   Commonly known as: OS-CAL   Take 600 mg by mouth daily.      gi cocktail Susp suspension   Take 30 mLs by mouth 3 (three) times daily. Shake well.      HYDROcodone-acetaminophen 5-500 MG per tablet   Commonly known as: VICODIN   Take 1 tablet by mouth every 6 (six) hours as needed for pain.      LORazepam 1 MG tablet   Commonly known as: ATIVAN   Take 1 tablet (1 mg total) by mouth 3 (three) times daily as needed for anxiety.      lubiprostone 24 MCG capsule   Commonly known as: AMITIZA   Take 24 mcg by mouth 2 (two) times daily with a meal.      metoCLOPramide 10 MG tablet   Commonly known as: REGLAN   Take 1 tablet (10 mg total) by mouth 4 (four) times daily -  before meals and at bedtime.      mometasone 50 MCG/ACT nasal spray   Commonly known as: NASONEX   Place 2 sprays into the nose daily as needed. Allergies      montelukast 10 MG tablet   Commonly known as: SINGULAIR   Take 10 mg by mouth at bedtime.      promethazine 12.5 MG tablet   Commonly known as: PHENERGAN   Take 2 tablets (25 mg total) by mouth every 6 (six) hours as needed for nausea.      saccharomyces boulardii 250 MG capsule   Commonly known as: FLORASTOR   Take 1 capsule (250 mg total) by mouth 2 (two) times daily.           Follow-up Information    Follow up with Allean Found, MD. In 3 weeks.   Contact information:   [address] [place] [address] [phone]       Follow up with Barrie Folk, MD. On  02/10/2012. (appointment scheduled 1:45 pm with Dr. Madilyn Fireman)    Contact information:   [address]                         [place] [address] [phone]           The results of significant diagnostics from this hospitalization (including imaging,  microbiology, ancillary and laboratory) are listed below for reference.     Microbiology: No results found for this or any previous visit (from the past 240 hour(s)).   Labs: Basic Metabolic Panel:  Lab 01/31/12 1610 01/29/12 0540 01/28/12 0535 01/27/12 0610 01/26/12 0410  NA 129* 131* 131* 132* 131*  K 3.7 3.5 3.6 3.6 3.9  CL 96 97 102 99 99  CO2 25 26 24 26 23   GLUCOSE 92 84 92 91 93  BUN 5* 6 9 6  4*  CREATININE 0.65 0.74 0.69 0.68 0.65  CALCIUM 8.7 8.8 7.7* 8.4 8.2*  MG -- -- -- -- --  PHOS -- -- -- -- --   Liver Function Tests:  Lab 01/31/12 0500 01/27/12 0610 01/25/12 0500  AST 23 27 37  ALT 44* 55* 78*  ALKPHOS 44 41 43  BILITOT 0.4 0.3 0.3  PROT 6.1 5.8* 5.9*  ALBUMIN 3.2* 2.9* 2.8*    Lab 01/31/12 0500  LIPASE 63*  AMYLASE --   CBC:  Lab 01/31/12 0500 01/29/12 0540 01/28/12 0535 01/27/12 0610 01/26/12 0410  WBC 4.9 5.7 4.3 4.4 6.6  NEUTROABS -- -- -- -- --  HGB 12.7 12.5 11.0* 11.4* 12.0  HCT 36.6 36.8 32.7* 33.8* 35.0*  MCV 92.2 93.6 94.0 93.6 93.1  PLT 290 329 273 262 275   SIGNED: Time coordinating discharge: Over 30 minutes  Debbora Presto, MD  [facility] 01/31/2012, 10:13 AM Pager [phone]  If 7PM-7AM, please contact night-coverage [email or web address] Password [email or web address]

## 2012-05-24 ENCOUNTER — Other Ambulatory Visit: Payer: Self-pay | Admitting: Family Medicine

## 2012-05-24 DIAGNOSIS — R911 Solitary pulmonary nodule: Secondary | ICD-10-CM

## 2012-05-31 ENCOUNTER — Ambulatory Visit
Admission: RE | Admit: 2012-05-31 | Discharge: 2012-05-31 | Disposition: A | Discharge status: Home / Self Care | Payer: Managed Care, Other (non HMO) | Source: Ambulatory Visit | Attending: Family Medicine | Admitting: Family Medicine

## 2012-05-31 DIAGNOSIS — R911 Solitary pulmonary nodule: Secondary | ICD-10-CM

## 2012-12-25 ENCOUNTER — Encounter (INDEPENDENT_AMBULATORY_CARE_PROVIDER_SITE_OTHER): Payer: Private Health Insurance - Indemnity | Admitting: Ophthalmology

## 2012-12-25 DIAGNOSIS — H33309 Unspecified retinal break, unspecified eye: Secondary | ICD-10-CM

## 2012-12-25 DIAGNOSIS — H35419 Lattice degeneration of retina, unspecified eye: Secondary | ICD-10-CM

## 2012-12-25 DIAGNOSIS — H2510 Age-related nuclear cataract, unspecified eye: Secondary | ICD-10-CM

## 2012-12-25 DIAGNOSIS — H4420 Degenerative myopia, unspecified eye: Secondary | ICD-10-CM

## 2012-12-25 DIAGNOSIS — H43819 Vitreous degeneration, unspecified eye: Secondary | ICD-10-CM

## 2013-01-14 ENCOUNTER — Other Ambulatory Visit: Payer: Self-pay | Admitting: Gynecology

## 2013-01-14 DIAGNOSIS — R928 Other abnormal and inconclusive findings on diagnostic imaging of breast: Secondary | ICD-10-CM

## 2013-01-30 ENCOUNTER — Ambulatory Visit (INDEPENDENT_AMBULATORY_CARE_PROVIDER_SITE_OTHER): Payer: Private Health Insurance - Indemnity | Admitting: Ophthalmology

## 2013-01-30 DIAGNOSIS — H33309 Unspecified retinal break, unspecified eye: Secondary | ICD-10-CM

## 2013-02-14 ENCOUNTER — Ambulatory Visit
Admission: RE | Admit: 2013-02-14 | Discharge: 2013-02-14 | Disposition: A | Discharge status: Home / Self Care | Payer: Managed Care, Other (non HMO) | Source: Ambulatory Visit | Attending: Gynecology | Admitting: Gynecology

## 2013-02-14 DIAGNOSIS — R928 Other abnormal and inconclusive findings on diagnostic imaging of breast: Secondary | ICD-10-CM

## 2013-05-16 ENCOUNTER — Other Ambulatory Visit: Payer: Self-pay | Admitting: Family Medicine

## 2013-05-16 DIAGNOSIS — R911 Solitary pulmonary nodule: Secondary | ICD-10-CM

## 2013-05-30 ENCOUNTER — Ambulatory Visit (INDEPENDENT_AMBULATORY_CARE_PROVIDER_SITE_OTHER): Payer: Private Health Insurance - Indemnity | Admitting: Ophthalmology

## 2013-06-03 ENCOUNTER — Ambulatory Visit (INDEPENDENT_AMBULATORY_CARE_PROVIDER_SITE_OTHER): Payer: Private Health Insurance - Indemnity | Admitting: Ophthalmology

## 2013-06-03 ENCOUNTER — Ambulatory Visit
Admission: RE | Admit: 2013-06-03 | Discharge: 2013-06-03 | Disposition: A | Discharge status: Home / Self Care | Payer: Managed Care, Other (non HMO) | Source: Ambulatory Visit | Attending: Family Medicine | Admitting: Family Medicine

## 2013-06-03 DIAGNOSIS — H2510 Age-related nuclear cataract, unspecified eye: Secondary | ICD-10-CM

## 2013-06-03 DIAGNOSIS — H33309 Unspecified retinal break, unspecified eye: Secondary | ICD-10-CM

## 2013-06-03 DIAGNOSIS — H4420 Degenerative myopia, unspecified eye: Secondary | ICD-10-CM

## 2013-06-03 DIAGNOSIS — H43819 Vitreous degeneration, unspecified eye: Secondary | ICD-10-CM

## 2013-06-03 DIAGNOSIS — R911 Solitary pulmonary nodule: Secondary | ICD-10-CM

## 2014-01-03 HISTORY — PX: EYE SURGERY: SHX253

## 2014-06-09 ENCOUNTER — Ambulatory Visit (INDEPENDENT_AMBULATORY_CARE_PROVIDER_SITE_OTHER): Payer: BLUE CROSS/BLUE SHIELD | Admitting: Ophthalmology

## 2014-06-09 DIAGNOSIS — H33302 Unspecified retinal break, left eye: Secondary | ICD-10-CM | POA: Diagnosis not present

## 2014-06-09 DIAGNOSIS — H4423 Degenerative myopia, bilateral: Secondary | ICD-10-CM

## 2014-06-09 DIAGNOSIS — H2513 Age-related nuclear cataract, bilateral: Secondary | ICD-10-CM | POA: Diagnosis not present

## 2014-06-09 DIAGNOSIS — H43813 Vitreous degeneration, bilateral: Secondary | ICD-10-CM | POA: Diagnosis not present

## 2014-07-29 ENCOUNTER — Other Ambulatory Visit: Payer: Self-pay

## 2014-07-29 DIAGNOSIS — Z1231 Encounter for screening mammogram for malignant neoplasm of breast: Secondary | ICD-10-CM

## 2014-09-03 ENCOUNTER — Ambulatory Visit
Admission: RE | Admit: 2014-09-03 | Discharge: 2014-09-03 | Disposition: A | Discharge status: Home / Self Care | Payer: BLUE CROSS/BLUE SHIELD | Source: Ambulatory Visit

## 2014-09-03 DIAGNOSIS — Z1231 Encounter for screening mammogram for malignant neoplasm of breast: Secondary | ICD-10-CM

## 2014-09-09 ENCOUNTER — Other Ambulatory Visit: Payer: Self-pay | Admitting: Obstetrics and Gynecology

## 2014-09-09 DIAGNOSIS — R928 Other abnormal and inconclusive findings on diagnostic imaging of breast: Secondary | ICD-10-CM

## 2014-09-17 ENCOUNTER — Ambulatory Visit
Admission: RE | Admit: 2014-09-17 | Discharge: 2014-09-17 | Disposition: A | Discharge status: Home / Self Care | Payer: BLUE CROSS/BLUE SHIELD | Source: Ambulatory Visit | Attending: Obstetrics and Gynecology | Admitting: Obstetrics and Gynecology

## 2014-09-17 ENCOUNTER — Other Ambulatory Visit: Payer: Self-pay | Admitting: Obstetrics and Gynecology

## 2014-09-17 DIAGNOSIS — R928 Other abnormal and inconclusive findings on diagnostic imaging of breast: Secondary | ICD-10-CM

## 2014-09-24 ENCOUNTER — Ambulatory Visit
Admission: RE | Admit: 2014-09-24 | Discharge: 2014-09-24 | Disposition: A | Discharge status: Home / Self Care | Payer: BLUE CROSS/BLUE SHIELD | Source: Ambulatory Visit | Attending: Obstetrics and Gynecology | Admitting: Obstetrics and Gynecology

## 2014-09-24 ENCOUNTER — Other Ambulatory Visit: Payer: Self-pay | Admitting: Obstetrics and Gynecology

## 2014-09-24 DIAGNOSIS — R928 Other abnormal and inconclusive findings on diagnostic imaging of breast: Secondary | ICD-10-CM

## 2014-09-24 HISTORY — PX: BREAST BIOPSY: SHX20

## 2014-09-30 ENCOUNTER — Other Ambulatory Visit: Payer: Self-pay | Admitting: General Surgery

## 2014-09-30 DIAGNOSIS — N6021 Fibroadenosis of right breast: Secondary | ICD-10-CM

## 2014-10-16 ENCOUNTER — Other Ambulatory Visit: Payer: Self-pay | Admitting: General Surgery

## 2014-10-16 DIAGNOSIS — N6021 Fibroadenosis of right breast: Secondary | ICD-10-CM

## 2014-10-17 ENCOUNTER — Other Ambulatory Visit: Payer: Self-pay | Admitting: Gastroenterology

## 2014-10-21 ENCOUNTER — Ambulatory Visit
Admission: RE | Admit: 2014-10-21 | Discharge: 2014-10-21 | Disposition: A | Discharge status: Home / Self Care | Payer: BLUE CROSS/BLUE SHIELD | Source: Ambulatory Visit | Attending: Gastroenterology | Admitting: Gastroenterology

## 2014-10-21 ENCOUNTER — Other Ambulatory Visit: Payer: Self-pay | Admitting: Gastroenterology

## 2014-10-21 DIAGNOSIS — R112 Nausea with vomiting, unspecified: Secondary | ICD-10-CM

## 2014-10-29 ENCOUNTER — Other Ambulatory Visit (HOSPITAL_COMMUNITY): Payer: Self-pay | Admitting: Gastroenterology

## 2014-10-29 DIAGNOSIS — R112 Nausea with vomiting, unspecified: Secondary | ICD-10-CM

## 2014-11-06 ENCOUNTER — Ambulatory Visit (HOSPITAL_COMMUNITY)
Admission: RE | Admit: 2014-11-06 | Discharge: 2014-11-06 | Disposition: A | Discharge status: Home / Self Care | Payer: BLUE CROSS/BLUE SHIELD | Source: Ambulatory Visit | Attending: Gastroenterology | Admitting: Gastroenterology

## 2014-11-06 DIAGNOSIS — K219 Gastro-esophageal reflux disease without esophagitis: Secondary | ICD-10-CM | POA: Diagnosis not present

## 2014-11-06 DIAGNOSIS — R112 Nausea with vomiting, unspecified: Secondary | ICD-10-CM | POA: Diagnosis present

## 2014-11-06 MED ORDER — TECHNETIUM TC 99M SULFUR COLLOID
2.2000 | Freq: Once | INTRAVENOUS | Status: AC
  Administered 2014-11-06: 2.2 via INTRAVENOUS

## 2014-11-13 ENCOUNTER — Encounter (HOSPITAL_BASED_OUTPATIENT_CLINIC_OR_DEPARTMENT_OTHER): Payer: Self-pay | Admitting: *Deleted

## 2014-11-17 ENCOUNTER — Ambulatory Visit
Admission: RE | Admit: 2014-11-17 | Discharge: 2014-11-17 | Disposition: A | Discharge status: Home / Self Care | Payer: BLUE CROSS/BLUE SHIELD | Source: Ambulatory Visit | Attending: General Surgery | Admitting: General Surgery

## 2014-11-17 DIAGNOSIS — N6021 Fibroadenosis of right breast: Secondary | ICD-10-CM

## 2014-11-20 ENCOUNTER — Encounter (HOSPITAL_BASED_OUTPATIENT_CLINIC_OR_DEPARTMENT_OTHER)
Admission: RE | Disposition: A | Discharge status: Home / Self Care | Payer: Self-pay | Source: Ambulatory Visit | Attending: General Surgery

## 2014-11-20 ENCOUNTER — Ambulatory Visit (HOSPITAL_BASED_OUTPATIENT_CLINIC_OR_DEPARTMENT_OTHER)
Admission: RE | Admit: 2014-11-20 | Discharge: 2014-11-20 | Disposition: A | Discharge status: Home / Self Care | Payer: BLUE CROSS/BLUE SHIELD | Source: Ambulatory Visit | Attending: General Surgery | Admitting: General Surgery

## 2014-11-20 ENCOUNTER — Encounter (HOSPITAL_BASED_OUTPATIENT_CLINIC_OR_DEPARTMENT_OTHER): Payer: Self-pay | Admitting: Anesthesiology

## 2014-11-20 ENCOUNTER — Ambulatory Visit
Admission: RE | Admit: 2014-11-20 | Discharge: 2014-11-20 | Disposition: A | Discharge status: Home / Self Care | Payer: BLUE CROSS/BLUE SHIELD | Source: Ambulatory Visit | Attending: General Surgery | Admitting: General Surgery

## 2014-11-20 ENCOUNTER — Ambulatory Visit (HOSPITAL_BASED_OUTPATIENT_CLINIC_OR_DEPARTMENT_OTHER): Payer: BLUE CROSS/BLUE SHIELD | Admitting: Anesthesiology

## 2014-11-20 DIAGNOSIS — Z79899 Other long term (current) drug therapy: Secondary | ICD-10-CM | POA: Diagnosis not present

## 2014-11-20 DIAGNOSIS — Z90710 Acquired absence of both cervix and uterus: Secondary | ICD-10-CM | POA: Insufficient documentation

## 2014-11-20 DIAGNOSIS — N6021 Fibroadenosis of right breast: Secondary | ICD-10-CM | POA: Diagnosis present

## 2014-11-20 DIAGNOSIS — K219 Gastro-esophageal reflux disease without esophagitis: Secondary | ICD-10-CM | POA: Insufficient documentation

## 2014-11-20 DIAGNOSIS — N6011 Diffuse cystic mastopathy of right breast: Secondary | ICD-10-CM | POA: Diagnosis not present

## 2014-11-20 DIAGNOSIS — J45909 Unspecified asthma, uncomplicated: Secondary | ICD-10-CM | POA: Diagnosis not present

## 2014-11-20 DIAGNOSIS — M1990 Unspecified osteoarthritis, unspecified site: Secondary | ICD-10-CM | POA: Insufficient documentation

## 2014-11-20 DIAGNOSIS — N62 Hypertrophy of breast: Secondary | ICD-10-CM | POA: Insufficient documentation

## 2014-11-20 DIAGNOSIS — N6091 Unspecified benign mammary dysplasia of right breast: Secondary | ICD-10-CM | POA: Diagnosis not present

## 2014-11-20 HISTORY — PX: BREAST LUMPECTOMY WITH RADIOACTIVE SEED LOCALIZATION: SHX6424

## 2014-11-20 HISTORY — PX: BREAST EXCISIONAL BIOPSY: SUR124

## 2014-11-20 SURGERY — BREAST LUMPECTOMY WITH RADIOACTIVE SEED LOCALIZATION
Anesthesia: General | Site: Breast | Laterality: Right

## 2014-11-20 MED ORDER — FENTANYL CITRATE (PF) 100 MCG/2ML IJ SOLN: 50.0000 ug | INTRAMUSCULAR | Status: DC | PRN

## 2014-11-20 MED ORDER — BUPIVACAINE HCL (PF) 0.25 % IJ SOLN
INTRAMUSCULAR | Status: AC
  Filled 2014-11-20: qty 90

## 2014-11-20 MED ORDER — CHLORHEXIDINE GLUCONATE 4 % EX LIQD: 1.0000 "application " | Freq: Once | CUTANEOUS | Status: DC

## 2014-11-20 MED ORDER — PHENYLEPHRINE HCL 10 MG/ML IJ SOLN
INTRAMUSCULAR | Status: DC | PRN
  Administered 2014-11-20 (×3): 40 ug via INTRAVENOUS

## 2014-11-20 MED ORDER — FENTANYL CITRATE (PF) 100 MCG/2ML IJ SOLN
INTRAMUSCULAR | Status: AC
  Filled 2014-11-20: qty 2

## 2014-11-20 MED ORDER — BUPIVACAINE HCL (PF) 0.5 % IJ SOLN
INTRAMUSCULAR | Status: AC
  Filled 2014-11-20: qty 90

## 2014-11-20 MED ORDER — MIDAZOLAM HCL 2 MG/2ML IJ SOLN
INTRAMUSCULAR | Status: AC
  Filled 2014-11-20: qty 2

## 2014-11-20 MED ORDER — SCOPOLAMINE 1 MG/3DAYS TD PT72
MEDICATED_PATCH | TRANSDERMAL | Status: AC
  Filled 2014-11-20: qty 1

## 2014-11-20 MED ORDER — PROPOFOL 10 MG/ML IV BOLUS
INTRAVENOUS | Status: DC | PRN
Start: 2014-11-20 — End: 2014-11-20
  Administered 2014-11-20: 20 mg via INTRAVENOUS
  Administered 2014-11-20: 200 mg via INTRAVENOUS

## 2014-11-20 MED ORDER — ONDANSETRON HCL 4 MG/2ML IJ SOLN
INTRAMUSCULAR | Status: DC | PRN
  Administered 2014-11-20: 4 mg via INTRAVENOUS

## 2014-11-20 MED ORDER — MIDAZOLAM HCL 5 MG/5ML IJ SOLN
INTRAMUSCULAR | Status: DC | PRN
  Administered 2014-11-20: 2 mg via INTRAVENOUS

## 2014-11-20 MED ORDER — BUPIVACAINE-EPINEPHRINE (PF) 0.25% -1:200000 IJ SOLN
INTRAMUSCULAR | Status: AC
  Filled 2014-11-20: qty 120

## 2014-11-20 MED ORDER — EPHEDRINE SULFATE 50 MG/ML IJ SOLN
INTRAMUSCULAR | Status: DC | PRN
  Administered 2014-11-20: 10 mg via INTRAVENOUS

## 2014-11-20 MED ORDER — FENTANYL CITRATE (PF) 100 MCG/2ML IJ SOLN
25.0000 ug | INTRAMUSCULAR | Status: DC | PRN
  Administered 2014-11-20: 25 ug via INTRAVENOUS

## 2014-11-20 MED ORDER — SCOPOLAMINE 1 MG/3DAYS TD PT72
1.0000 | MEDICATED_PATCH | Freq: Once | TRANSDERMAL | Status: DC | PRN
  Administered 2014-11-20: 1.5 mg via TRANSDERMAL

## 2014-11-20 MED ORDER — FENTANYL CITRATE (PF) 100 MCG/2ML IJ SOLN
INTRAMUSCULAR | Status: DC | PRN
  Administered 2014-11-20: 100 ug via INTRAVENOUS

## 2014-11-20 MED ORDER — BUPIVACAINE HCL (PF) 0.25 % IJ SOLN
INTRAMUSCULAR | Status: AC
  Filled 2014-11-20: qty 30

## 2014-11-20 MED ORDER — ONDANSETRON HCL 4 MG/2ML IJ SOLN: 4.0000 mg | Freq: Once | INTRAMUSCULAR | Status: DC | PRN

## 2014-11-20 MED ORDER — OXYCODONE-ACETAMINOPHEN 5-325 MG PO TABS: 1.0000 | ORAL_TABLET | ORAL | Status: DC | PRN

## 2014-11-20 MED ORDER — MIDAZOLAM HCL 2 MG/2ML IJ SOLN: 1.0000 mg | INTRAMUSCULAR | Status: DC | PRN

## 2014-11-20 MED ORDER — BUPIVACAINE HCL (PF) 0.25 % IJ SOLN
INTRAMUSCULAR | Status: DC | PRN
  Administered 2014-11-20: 20 mL

## 2014-11-20 MED ORDER — OXYCODONE HCL 5 MG PO TABS
ORAL_TABLET | ORAL | Status: AC
  Filled 2014-11-20: qty 1

## 2014-11-20 MED ORDER — CEFAZOLIN SODIUM-DEXTROSE 2-3 GM-% IV SOLR
2.0000 g | INTRAVENOUS | Status: AC
  Administered 2014-11-20: 2 g via INTRAVENOUS

## 2014-11-20 MED ORDER — GLYCOPYRROLATE 0.2 MG/ML IJ SOLN
0.2000 mg | Freq: Once | INTRAMUSCULAR | Status: AC | PRN
  Administered 2014-11-20: 0.2 mg via INTRAVENOUS

## 2014-11-20 MED ORDER — LIDOCAINE HCL (CARDIAC) 20 MG/ML IV SOLN
INTRAVENOUS | Status: DC | PRN
  Administered 2014-11-20: 50 mg via INTRAVENOUS

## 2014-11-20 MED ORDER — DEXAMETHASONE SODIUM PHOSPHATE 4 MG/ML IJ SOLN
INTRAMUSCULAR | Status: DC | PRN
  Administered 2014-11-20: 10 mg via INTRAVENOUS

## 2014-11-20 MED ORDER — CEFAZOLIN SODIUM-DEXTROSE 2-3 GM-% IV SOLR
INTRAVENOUS | Status: AC
  Filled 2014-11-20: qty 50

## 2014-11-20 MED ORDER — DEXAMETHASONE SODIUM PHOSPHATE 10 MG/ML IJ SOLN
INTRAMUSCULAR | Status: AC
  Filled 2014-11-20: qty 1

## 2014-11-20 MED ORDER — PROPOFOL 10 MG/ML IV BOLUS
INTRAVENOUS | Status: AC
  Filled 2014-11-20: qty 40

## 2014-11-20 MED ORDER — LACTATED RINGERS IV SOLN
INTRAVENOUS | Status: DC
  Administered 2014-11-20 (×2): via INTRAVENOUS

## 2014-11-20 MED ORDER — OXYCODONE HCL 5 MG PO TABS
5.0000 mg | ORAL_TABLET | Freq: Once | ORAL | Status: AC | PRN
  Administered 2014-11-20: 5 mg via ORAL

## 2014-11-20 MED ORDER — LIDOCAINE HCL (CARDIAC) 20 MG/ML IV SOLN
INTRAVENOUS | Status: AC
  Filled 2014-11-20: qty 5

## 2014-11-20 MED ORDER — ONDANSETRON HCL 4 MG/2ML IJ SOLN
INTRAMUSCULAR | Status: AC
  Filled 2014-11-20: qty 2

## 2014-11-20 SURGICAL SUPPLY — 43 items
APPLIER CLIP 9.375 MED OPEN (MISCELLANEOUS)
APR CLP MED 9.3 20 MLT OPN (MISCELLANEOUS)
BLADE SURG 15 STRL LF DISP TIS (BLADE) ×1 IMPLANT
BLADE SURG 15 STRL SS (BLADE) ×2
CANISTER SUC SOCK COL 7IN (MISCELLANEOUS) ×2 IMPLANT
CANISTER SUCT 1200ML W/VALVE (MISCELLANEOUS) ×2 IMPLANT
CHLORAPREP W/TINT 26ML (MISCELLANEOUS) ×2 IMPLANT
CLIP APPLIE 9.375 MED OPEN (MISCELLANEOUS) IMPLANT
COVER BACK TABLE 60X90IN (DRAPES) ×2 IMPLANT
COVER MAYO STAND STRL (DRAPES) ×2 IMPLANT
COVER PROBE W GEL 5X96 (DRAPES) ×2 IMPLANT
DECANTER SPIKE VIAL GLASS SM (MISCELLANEOUS) ×1 IMPLANT
DEVICE DUBIN W/COMP PLATE 8390 (MISCELLANEOUS) ×2 IMPLANT
DRAPE LAPAROSCOPIC ABDOMINAL (DRAPES) ×1 IMPLANT
DRAPE UTILITY XL STRL (DRAPES) ×2 IMPLANT
ELECT COATED BLADE 2.86 ST (ELECTRODE) ×2 IMPLANT
ELECT REM PT RETURN 9FT ADLT (ELECTROSURGICAL) ×2
ELECTRODE REM PT RTRN 9FT ADLT (ELECTROSURGICAL) ×1 IMPLANT
GLOVE BIO SURGEON STRL SZ 6.5 (GLOVE) ×1 IMPLANT
GLOVE BIO SURGEON STRL SZ7.5 (GLOVE) ×4 IMPLANT
GLOVE BIOGEL PI IND STRL 7.0 (GLOVE) IMPLANT
GLOVE BIOGEL PI INDICATOR 7.0 (GLOVE) ×2
GLOVE SS BIOGEL STRL SZ 7.5 (GLOVE) IMPLANT
GLOVE SUPERSENSE BIOGEL SZ 7.5 (GLOVE) ×1
GOWN STRL REUS W/ TWL LRG LVL3 (GOWN DISPOSABLE) ×2 IMPLANT
GOWN STRL REUS W/TWL LRG LVL3 (GOWN DISPOSABLE) ×4
KIT MARKER MARGIN INK (KITS) ×2 IMPLANT
LIQUID BAND (GAUZE/BANDAGES/DRESSINGS) ×2 IMPLANT
NDL HYPO 25X1 1.5 SAFETY (NEEDLE) IMPLANT
NEEDLE HYPO 25X1 1.5 SAFETY (NEEDLE) IMPLANT
NS IRRIG 1000ML POUR BTL (IV SOLUTION) ×1 IMPLANT
PACK BASIN DAY SURGERY FS (CUSTOM PROCEDURE TRAY) ×2 IMPLANT
PENCIL BUTTON HOLSTER BLD 10FT (ELECTRODE) ×2 IMPLANT
SLEEVE SCD COMPRESS KNEE MED (MISCELLANEOUS) ×2 IMPLANT
SPONGE LAP 18X18 X RAY DECT (DISPOSABLE) ×2 IMPLANT
SUT MON AB 4-0 PC3 18 (SUTURE) IMPLANT
SUT SILK 2 0 SH (SUTURE) IMPLANT
SUT VICRYL 3-0 CR8 SH (SUTURE) ×2 IMPLANT
SYR CONTROL 10ML LL (SYRINGE) IMPLANT
TOWEL OR 17X24 6PK STRL BLUE (TOWEL DISPOSABLE) ×2 IMPLANT
TOWEL OR NON WOVEN STRL DISP B (DISPOSABLE) ×2 IMPLANT
TUBE CONNECTING 20X1/4 (TUBING) ×2 IMPLANT
YANKAUER SUCT BULB TIP NO VENT (SUCTIONS) IMPLANT

## 2014-11-20 NOTE — Transfer of Care (Signed)
Immediate Anesthesia Transfer of Care Note  Patient: Mandy SchlatterSharon C Binning  Procedure(s) Performed: Procedure(s): RIGHT BREAST LUMPECTOMY WITH RADIOACTIVE SEED LOCALIZATION (Right)  Patient Location: PACU  Anesthesia Type:General  Level of Consciousness: awake and patient cooperative  Airway & Oxygen Therapy: Patient Spontanous Breathing and Patient connected to face mask oxygen  Post-op Assessment: Report given to RN and Post -op Vital signs reviewed and stable  Post vital signs: Reviewed and stable  Last Vitals:  Filed Vitals:   11/20/14 0737  BP: 109/53  Pulse: 65  Temp: 36.4 C  Resp: 20    Complications: No apparent anesthesia complications

## 2014-11-20 NOTE — Anesthesia Preprocedure Evaluation (Addendum)
Anesthesia Evaluation  Patient identified by MRN, date of birth, ID band Patient awake    Reviewed: Allergy & Precautions, H&P , NPO status , Patient's Chart, lab work & pertinent test results  History of Anesthesia Complications Negative for: history of anesthetic complications  Airway Mallampati: II  TM Distance: >3 FB Neck ROM: full    Dental no notable dental hx.    Pulmonary asthma ,    Pulmonary exam normal breath sounds clear to auscultation       Cardiovascular negative cardio ROS Normal cardiovascular exam Rhythm:regular Rate:Normal     Neuro/Psych negative neurological ROS     GI/Hepatic Neg liver ROS, GERD  ,diverticulosis   Endo/Other  negative endocrine ROS  Renal/GU negative Renal ROS     Musculoskeletal   Abdominal   Peds  Hematology negative hematology ROS (+)   Anesthesia Other Findings Denies reflux or nausea this am, NPO appropriate  Reproductive/Obstetrics negative OB ROS                            Anesthesia Physical Anesthesia Plan  ASA: II  Anesthesia Plan: General   Post-op Pain Management:    Induction: Intravenous  Airway Management Planned: LMA  Additional Equipment: None  Intra-op Plan:   Post-operative Plan: Extubation in OR  Informed Consent: I have reviewed the patients History and Physical, chart, labs and discussed the procedure including the risks, benefits and alternatives for the proposed anesthesia with the patient or authorized representative who has indicated his/her understanding and acceptance.   Dental Advisory Given  Plan Discussed with: Anesthesiologist, CRNA and Surgeon  Anesthesia Plan Comments: (She was concerned about GA affecting her GI transit time, reassured her that for a short procedure in outpatient setting with minimal need for opioids her risk of this side effect is minimal. She had no further questions or concerns.)        Anesthesia Quick Evaluation

## 2014-11-20 NOTE — H&P (Signed)
Mandy Hanson. Mandy Hanson 09/30/2014 10:10 AM Location: [facility] Patient #: [id number] DOB: 03/23/1950 Married / Language: Mandy Hanson / Race: White Female   History of Present Illness Mandy Hanson. Mandy Hanson; 09/30/2014 10:45 AM) Patient words: complex sclerosing lesion right breast.  The patient is a 64 year old female who presents with a breast mass. We are asked to see the patient in consultation by Dr. Merri Hanson to evaluate her for a complex sclerosing lesion of the right breast. The patient is a 64 year old white female who recently went for a routine screening mammogram. At that time she was found to have a small area of distortion in the medial right breast. Is was biopsied and came back as a complex sclerosing lesion. She denied any breast pain. She denies any discharge from the nipple. She has no personal or family history of breast cancer. She does take a Femring as hormone replacement   Other Problems Mandy Hanson, Hanson; 09/30/2014 10:14 AM) Arthritis Asthma Back Pain Diverticulosis Gastroesophageal Reflux Disease Oophorectomy  Past Surgical History Mandy Hanson, Hanson; 09/30/2014 10:14 AM) Appendectomy Breast Biopsy Right. Foot Surgery Right. Hysterectomy (not due to cancer) - Partial Oral Surgery Tonsillectomy  Diagnostic Studies History Mandy Hanson, [name]; 09/30/2014 10:14 AM) Colonoscopy 1-5 years ago Mammogram within last year  Allergies Mandy Hanson, Hanson; 09/30/2014 10:14 AM) No Known Drug Allergies09/27/2016  Medication History Mandy Hanson, Hanson; 09/30/2014 10:22 AM) Astelin (137MCG/SPRAY Solution, Nasal prn) Active. Amitiza ( Capsule, Oral) Active. Nasonex (50MCG/ACT Suspension, Nasal) Active. Singulair (  Tablet, Oral) Active. Florastor (  Capsule, Oral) Active.  Social History Mandy Hanson, [name]; 09/30/2014 10:14 AM) Alcohol use Moderate alcohol use. Caffeine use Carbonated beverages,  Coffee, Tea. No drug use Tobacco use Never smoker.  Family History Mandy Hanson, [name]; 09/30/2014 10:14 AM) Arthritis Father, Mother. Heart Disease Mother. Heart disease in female family member before age 57  Pregnancy / Birth History Mandy Hanson, [name]; 09/30/2014 10:14 AM) Age at menarche 14 years. Age of menopause 30-50 Contraceptive History Oral contraceptives. Gravida 0 Irregular periods Para 0    Review of Systems Mandy Hanson; 09/30/2014 10:14 AM) General Not Present- Appetite Loss, Chills, Fatigue, Fever, Night Sweats, Weight Gain and Weight Loss. Skin Not Present- Change in Wart/Mole, Dryness, Hives, Jaundice, New Lesions, Non-Healing Wounds, Rash and Ulcer. HEENT Present- Seasonal Allergies, Visual Disturbances and Wears glasses/contact lenses. Not Present- Earache, Hearing Loss, Hoarseness, Nose Bleed, Oral Ulcers, Ringing in the Ears, Sinus Pain, Sore Throat and Yellow Eyes. Breast Not Present- Breast Mass, Breast Pain, Nipple Discharge and Skin Changes. Cardiovascular Present- Rapid Heart Rate. Not Present- Chest Pain, Difficulty Breathing Lying Down, Leg Cramps, Palpitations, Shortness of Breath and Swelling of Extremities. Gastrointestinal Present- Constipation, Indigestion, Nausea and Vomiting. Not Present- Abdominal Pain, Bloating, Bloody Stool, Change in Bowel Habits, Chronic diarrhea, Difficulty Swallowing, Excessive gas, Gets full quickly at meals, Hemorrhoids and Rectal Pain. Female Genitourinary Not Present- Frequency, Nocturia, Painful Urination, Pelvic Pain and Urgency. Musculoskeletal Present- Back Pain and Joint Stiffness. Not Present- Joint Pain, Muscle Pain, Muscle Weakness and Swelling of Extremities. Neurological Not Present- Decreased Memory, Fainting, Headaches, Numbness, Seizures, Tingling, Tremor, Trouble walking and Weakness. Psychiatric Not Present- Anxiety, Bipolar, Change in Sleep Pattern, Depression, Fearful and Frequent  crying. Endocrine Not Present- Cold Intolerance, Excessive Hunger, Hair Changes, Heat Intolerance, Hot flashes and New Diabetes. Hematology Not Present- Easy Bruising, Excessive bleeding, Gland problems, HIV and Persistent Infections.  Vitals Mandy Hanson; 09/30/2014 10:14 AM)  09/30/2014 10:13 AM Weight: 172.25 lb Height: 71in Body Surface Area: 1.98 m Body Mass Index: 24.02 kg/m  BP: 112/70 (Sitting, Left Arm, Standard)     Physical Exam Mandy Hanson(Mandy Hanson S. Mandy Edouardoth Hanson; 09/30/2014 10:46 AM) General Mental Status-Alert. General Appearance-Consistent with stated age. Hydration-Well hydrated. Voice-Normal.  Head and Neck Head-normocephalic, atraumatic with no lesions or palpable masses. Trachea-midline. Thyroid Gland Characteristics - normal size and consistency.  Eye Eyeball - Bilateral-Extraocular movements intact. Sclera/Conjunctiva - Bilateral-No scleral icterus.  Chest and Lung Exam Chest and lung exam reveals -quiet, even and easy respiratory effort with no use of accessory muscles and on auscultation, normal breath sounds, no adventitious sounds and normal vocal resonance. Inspection Chest Wall - Normal. Back - normal.  Breast Note: There is a palpable bruising in the upper portion of the right breast. Other than this there is no other palpable mass in either breast. There is no palpable axillary, supraclavicular, or cervical lymphadenopathy.   Cardiovascular Cardiovascular examination reveals -normal heart sounds, regular rate and rhythm with no murmurs and normal pedal pulses bilaterally.  Abdomen Inspection Inspection of the abdomen reveals - No Hernias. Skin - Scar - no surgical scars. Palpation/Percussion Palpation and Percussion of the abdomen reveal - Soft, Non Tender, No Rebound tenderness, No Rigidity (guarding) and No hepatosplenomegaly. Auscultation Auscultation of the abdomen reveals - Bowel sounds  normal.  Neurologic Neurologic evaluation reveals -alert and oriented x 3 with no impairment of recent or remote memory. Mental Status-Normal.  Musculoskeletal Normal Exam - Left-Upper Extremity Strength Normal and Lower Extremity Strength Normal. Normal Exam - Right-Upper Extremity Strength Normal and Lower Extremity Strength Normal.  Lymphatic Head & Neck  General Head & Neck Lymphatics: Bilateral - Description - Normal. Axillary  General Axillary Region: Bilateral - Description - Normal. Tenderness - Non Tender. Femoral & Inguinal  Generalized Femoral & Inguinal Lymphatics: Bilateral - Description - Normal. Tenderness - Non Tender.    Assessment & Plan Mandy Hanson(Bomani Oommen S. Mandy Edouardoth Hanson; 09/30/2014 10:44 AM) SCLEROSING ADENOSIS OF BREAST, RIGHT (N60.21) Impression: The patient appears to have a complex sclerosing lesion in the medial portion of the right breast. Because of its appearance and because this is a high risk lesion I would recommend that this area be removed. I have discussed with her in detail the risks and benefits of the operation to remove this area as well as some of the technical aspects and she understands and wishes to proceed. I will plan for a right breast radioactive seed localized lumpectomy. I have also talked to her about referral to the cancer center for risk reduction once the pathology is back. Current Plans Pt Education - Breast Diseases: discussed with patient and provided information.   Signed by Caleen EssexPaul S Toth, Hanson (09/30/2014 10:51 AM)

## 2014-11-20 NOTE — Anesthesia Postprocedure Evaluation (Signed)
  Anesthesia Post-op Note  Patient: Mandy SchlatterSharon C Utter  Procedure(s) Performed: Procedure(s) (LRB): RIGHT BREAST LUMPECTOMY WITH RADIOACTIVE SEED LOCALIZATION (Right)  Patient Location: PACU  Anesthesia Type: General  Level of Consciousness: awake and alert   Airway and Oxygen Therapy: Patient Spontanous Breathing  Post-op Pain: mild  Post-op Assessment: Post-op Vital signs reviewed, Patient's Cardiovascular Status Stable, Respiratory Function Stable, Patent Airway and No signs of Nausea or vomiting  Last Vitals:  Filed Vitals:   11/20/14 1015  BP: 94/56  Pulse: 61  Temp:   Resp: 11    Post-op Vital Signs: stable   Complications: No apparent anesthesia complications Doing well post op

## 2014-11-20 NOTE — Op Note (Signed)
11/20/2014  10:00 AM  PATIENT:  Mandy SchlatterSharon C Hanson  64 y.o. female  PRE-OPERATIVE DIAGNOSIS:  RIGHT BREAST SCLEROSING LESION  POST-OPERATIVE DIAGNOSIS:  RIGHT BREAST SCLEROSING LESION  PROCEDURE:  Procedure(s): RIGHT BREAST LUMPECTOMY WITH RADIOACTIVE SEED LOCALIZATION (Right)  SURGEON:  Surgeon(s) and Role:    * Griselda MinerPaul Toth III, MD - Primary  PHYSICIAN ASSISTANT:   ASSISTANTS: none   ANESTHESIA:   general  EBL:  Total I/O In: 1300 [I.V.:1300] Out: -   BLOOD ADMINISTERED:none  DRAINS: none   LOCAL MEDICATIONS USED:  MARCAINE     SPECIMEN:  Source of Specimen:  right breast tissue anterior to seed and with seed  DISPOSITION OF SPECIMEN:  PATHOLOGY  COUNTS:  YES  TOURNIQUET:  * No tourniquets in log *  DICTATION: .Dragon Dictation   After informed consent was obtained the patient was brought to the operating room and placed in the supine position on the operating room table. After adequate induction of general anesthesia the patient's right breast was prepped with ChloraPrep, allowed to dry, and draped in usual sterile manner. Previously an I-125 seed was placed in the upper inner quadrant of the right breast to mark an area of a complex sclerosing lesion. The neoprobe was set to I-125. The area of radioactivity was readily identified in the upper inner quadrant of the right breast centrally. A curvilinear incision was made along the upper edge of the areola of the right breast. The incision was carried through the skin and subcutaneous tissue sharply with electrocautery. While checking the area of radioactivity frequently with the neoprobe and a circular portion of breast tissue was excised sharply around the radioactive seed. Initially the specimen was removed was anterior to the seed and did not contain the radioactivity. This was marked with a short stitch on the superior surface and a long stitch on the lateral surface and was sent to pathology as the tissue anterior to the  seed. The area around the seed was then excised sharply with the electrocautery. Once the specimen was removed it was oriented with the appropriate paint colors. A specimen radiograph showed the clip in seed to be in the center of the specimen. The specimen was then sent to pathology for further evaluation. Hemostasis was achieved using the Bovie electrocautery. The wound was irrigated with copious amounts of saline and infiltrated with quarter percent Marcaine. The deep layer of the wound was then closed in layers of 3-0 Vicryl stitches. The skin was then closed with interrupted 4-0 Monocryl subcuticular stitches. Dermabond dressings were applied. The patient tolerated the procedure well. At the end of the case all needle sponge and assessment counts were correct. The patient was then awakened and taken to recovery in stable condition.  PLAN OF CARE: Discharge to home after PACU  PATIENT DISPOSITION:  PACU - hemodynamically stable.   Delay start of Pharmacological VTE agent (>24hrs) due to surgical blood loss or risk of bleeding: not applicable

## 2014-11-20 NOTE — Interval H&P Note (Signed)
History and Physical Interval Note:  11/20/2014 8:56 AM  Mandy SchlatterSharon C Hagerty  has presented today for surgery, with the diagnosis of RIGHT BREAST SCLEROSING LESION  The various methods of treatment have been discussed with the patient and family. After consideration of risks, benefits and other options for treatment, the patient has consented to  Procedure(s): RIGHT BREAST LUMPECTOMY WITH RADIOACTIVE SEED LOCALIZATION (Right) as a surgical intervention .  The patient's history has been reviewed, patient examined, no change in status, stable for surgery.  I have reviewed the patient's chart and labs.  Questions were answered to the patient's satisfaction.     TOTH III,PAUL S

## 2014-11-20 NOTE — Anesthesia Procedure Notes (Signed)
Procedure Name: LMA Insertion Date/Time: 11/20/2014 9:10 AM Performed by: Genevieve NorlanderLINKA, Mandy Hanson Pre-anesthesia Checklist: Patient identified, Emergency Drugs available, Suction available and Patient being monitored Patient Re-evaluated:Patient Re-evaluated prior to inductionOxygen Delivery Method: Circle System Utilized Preoxygenation: Pre-oxygenation with 100% oxygen Intubation Type: IV induction Ventilation: Mask ventilation without difficulty LMA: LMA inserted LMA Size: 4.0 Number of attempts: 1 Airway Equipment and Method: Bite block Placement Confirmation: positive ETCO2 Tube secured with: Tape Dental Injury: Teeth and Oropharynx as per pre-operative assessment

## 2014-11-20 NOTE — Progress Notes (Signed)
Relieved Judeth CornfieldStephanie, RN for break.

## 2014-11-21 ENCOUNTER — Encounter (HOSPITAL_BASED_OUTPATIENT_CLINIC_OR_DEPARTMENT_OTHER): Payer: Self-pay | Admitting: General Surgery

## 2014-12-02 IMAGING — CT CT CHEST W/ CM
3 of 4 series · 16 of 30 positions shown, 18 images · IV contrast (75CC OMNI 300)
Comparison: Outside CT performed September 19, 2011.

CLINICAL DATA: Follow-up pulmonary nodules.

CT CHEST WITH CONTRAST
TECHNIQUE: Multidetector CT imaging of the chest was performed
following the standard protocol during bolus administration of
intravenous contrast.
Contrast: 75mL OMNIPAQUE IOHEXOL 300 MG/ML  SOLN

[Series 3: chest with · axial · 0.69mm/px · z∈[-272,-42]mm · 5 of 70 slices shown, 7 images]
[im 12/70  mediastinal]
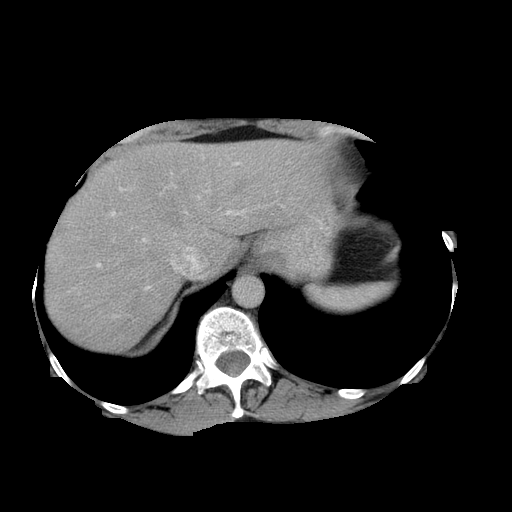
[im 12/70  lung]
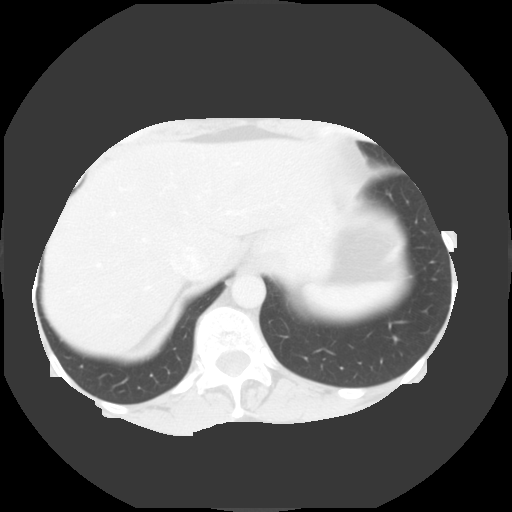
[im 24/70  lung]
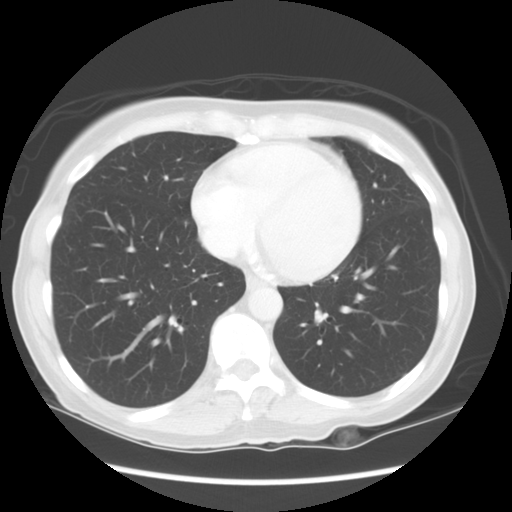
[im 35/70  lung]
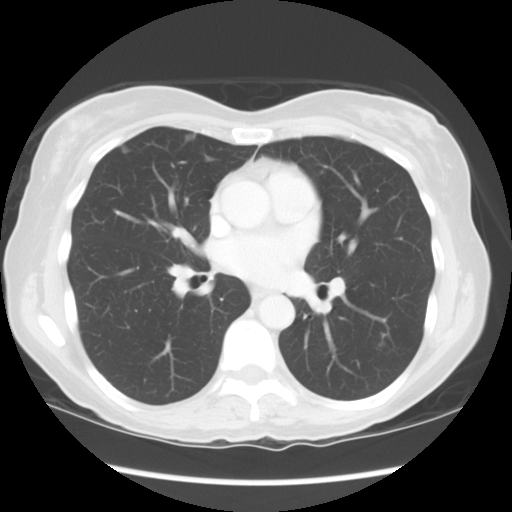
[im 47/70  lung]
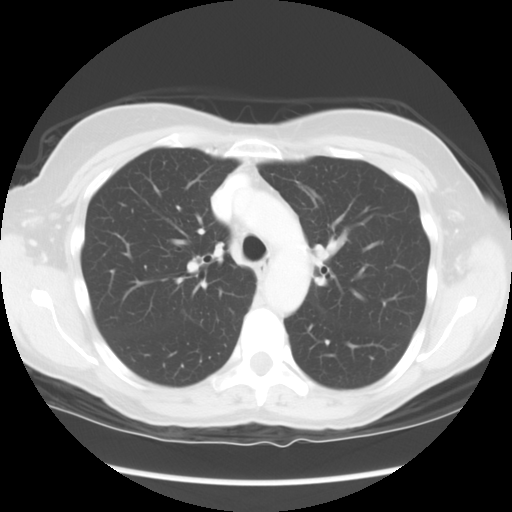
[im 58/70  mediastinal]
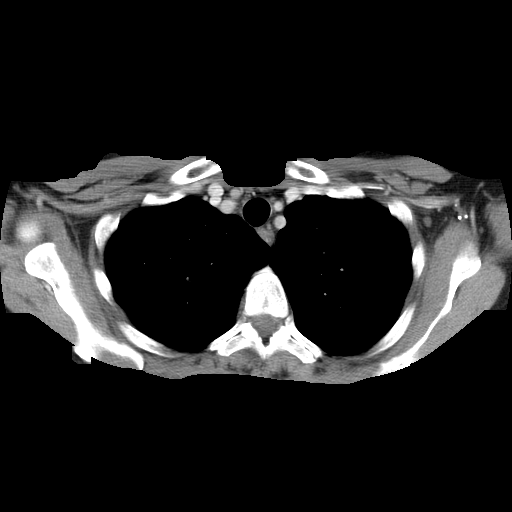
[im 58/70  lung]
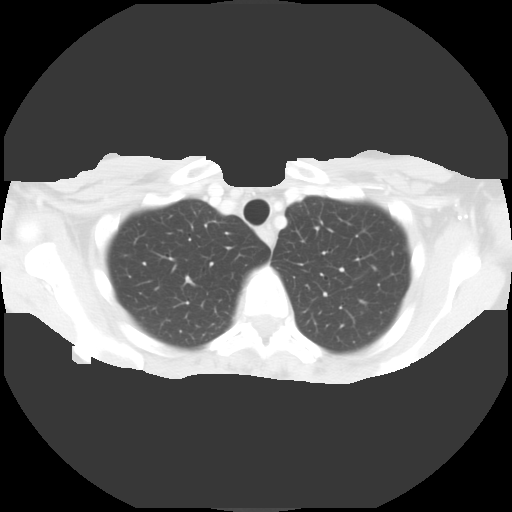

[Series 4: lung windows · axial · 0.69mm/px · z∈[-257,-37]mm · 5 of 66 slices shown]
[im 11/66  lung]
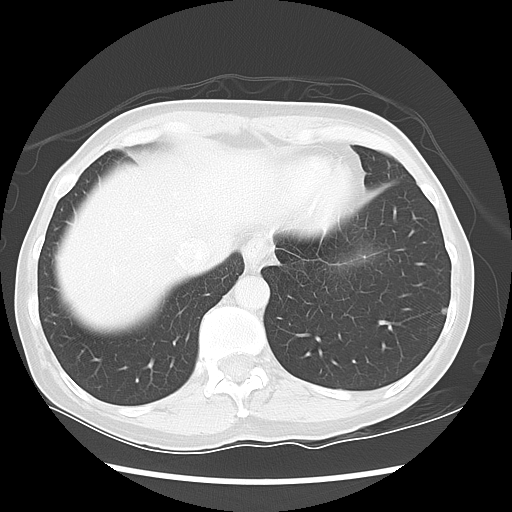
[im 22/66  lung]
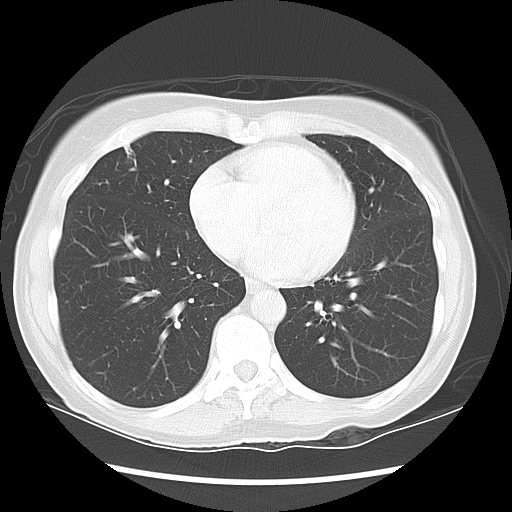
[im 33/66  lung]
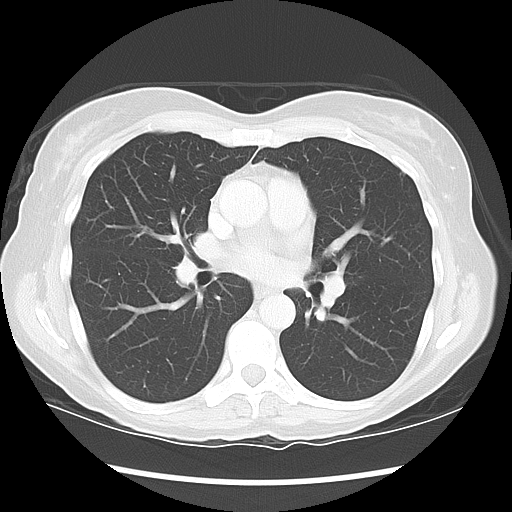
[im 44/66  lung]
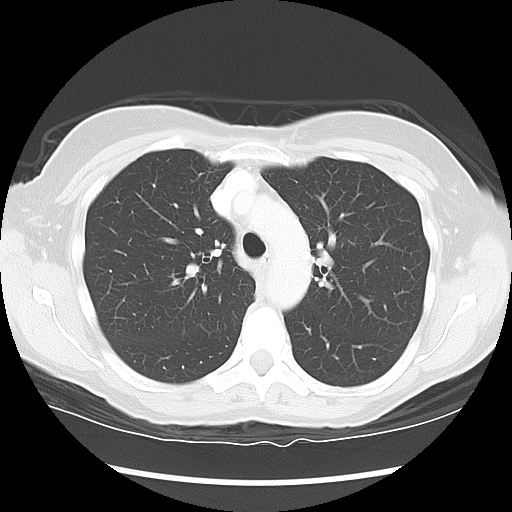
[im 55/66  lung]
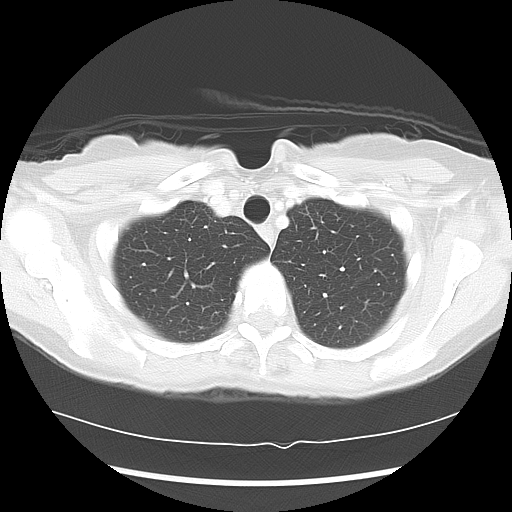

[Series 602: sagittal body · sagittal · 0.69mm/px · 6 of 142 slices shown]
[im 11/142  mediastinal]
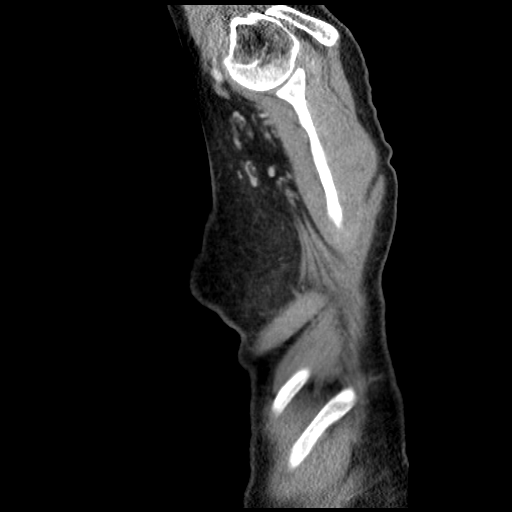
[im 33/142  mediastinal]
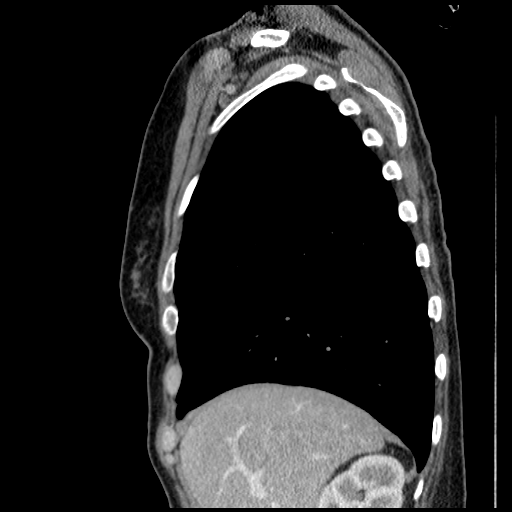
[im 44/142  mediastinal]
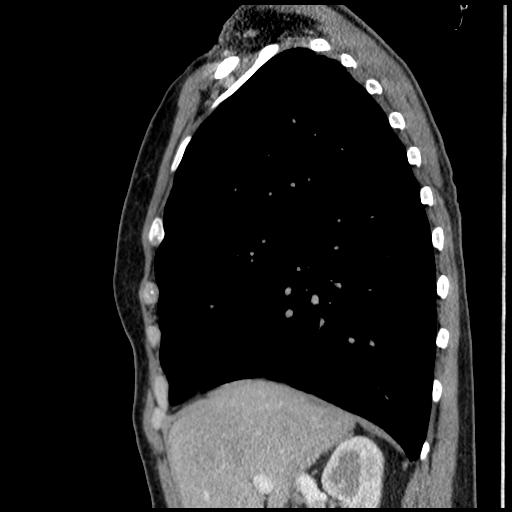
[im 66/142  mediastinal]
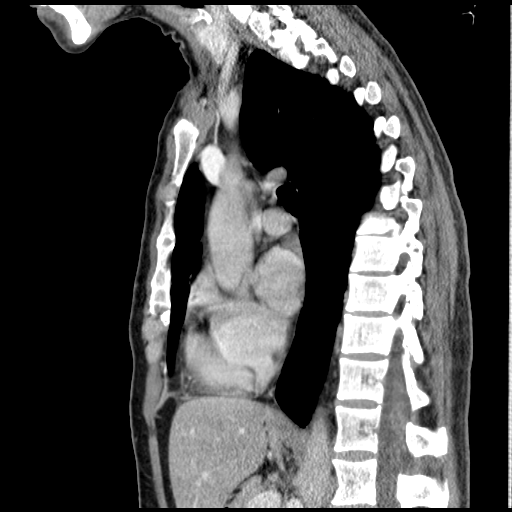
[im 76/142  mediastinal]
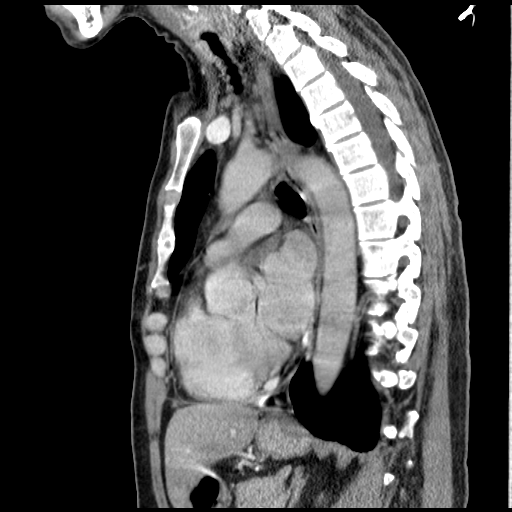
[im 98/142  mediastinal]
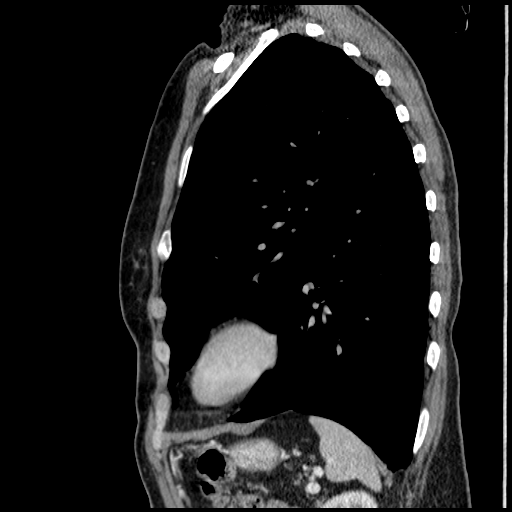

[16 of 30 positions shown; findings below may reference images not displayed]

FINDINGS: 7 mm peripheral right lower lobe nodule on image 50
compares with 8 mm on prior outside study.  5 mm nodule noted in
the left lower lobe on image 63 is pleural based and unchanged
since prior study.  5 mm left base nodule peripherally on image 56
in the left lower lobe, not imaged on outside CT abdomen.  Numerous
other scattered a tiny scattered subpleural nodules in the left
lower lobe.

Within the right middle lobe, there is a cluster of nodular
densities on images 40-46, likely postinflammatory.  Small ground-
glass nodular densities anteriorly in the right lung, right the
inferior right upper lobe on image 36.  This area is not imaged on
prior CT abdomen.  Small nodule on image 29 in the right upper
lobe.  This measures 3 mm.

Somewhat elongated/linear nodular area in the right upper lobe on
image 10 measures 9 mm.  Cluster of tiny nodules in the superior
segment of the left lower lobe on images 36-38.

No pleural effusions. Heart is normal size. Aorta is normal
caliber. No mediastinal, hilar, or axillary adenopathy.  Small
scattered bilateral axillary lymph nodes, none pathologically
enlarged. Visualized thyroid and chest wall soft tissues
unremarkable.

Imaging into the upper abdomen shows no acute findings.  No acute
bony abnormality.
IMPRESSION: Numerous bilateral pulmonary nodules, the majority of which are
either clustered or subpleural suggesting these are
postinflammatory.  The largest is 7 mm within the right lower lobe.
If the patient is at high risk for bronchogenic carcinoma, follow-
up chest CT at 3-6 months is recommended.  If the patient is at low
risk for bronchogenic carcinoma, follow-up chest CT at 6-12 months
is recommended.  This recommendation follows the consensus
statement: Guidelines for Management of Small Pulmonary Nodules
Detected on CT Scans: A Statement from the [HOSPITAL] as

## 2015-02-13 ENCOUNTER — Encounter (INDEPENDENT_AMBULATORY_CARE_PROVIDER_SITE_OTHER): Payer: BLUE CROSS/BLUE SHIELD | Admitting: Ophthalmology

## 2015-02-13 DIAGNOSIS — H43813 Vitreous degeneration, bilateral: Secondary | ICD-10-CM | POA: Diagnosis not present

## 2015-02-13 DIAGNOSIS — H4423 Degenerative myopia, bilateral: Secondary | ICD-10-CM

## 2015-02-13 DIAGNOSIS — H33302 Unspecified retinal break, left eye: Secondary | ICD-10-CM | POA: Diagnosis not present

## 2015-02-13 DIAGNOSIS — H2513 Age-related nuclear cataract, bilateral: Secondary | ICD-10-CM

## 2015-06-12 ENCOUNTER — Ambulatory Visit (INDEPENDENT_AMBULATORY_CARE_PROVIDER_SITE_OTHER): Payer: BLUE CROSS/BLUE SHIELD | Admitting: Ophthalmology

## 2015-07-01 ENCOUNTER — Encounter (INDEPENDENT_AMBULATORY_CARE_PROVIDER_SITE_OTHER): Payer: BLUE CROSS/BLUE SHIELD | Admitting: Ophthalmology

## 2015-07-01 DIAGNOSIS — H4423 Degenerative myopia, bilateral: Secondary | ICD-10-CM | POA: Diagnosis not present

## 2015-07-01 DIAGNOSIS — H33302 Unspecified retinal break, left eye: Secondary | ICD-10-CM | POA: Diagnosis not present

## 2015-07-01 DIAGNOSIS — H43813 Vitreous degeneration, bilateral: Secondary | ICD-10-CM | POA: Diagnosis not present

## 2015-07-15 ENCOUNTER — Ambulatory Visit (INDEPENDENT_AMBULATORY_CARE_PROVIDER_SITE_OTHER): Payer: BLUE CROSS/BLUE SHIELD | Admitting: Ophthalmology

## 2015-07-15 DIAGNOSIS — H33302 Unspecified retinal break, left eye: Secondary | ICD-10-CM

## 2015-11-16 ENCOUNTER — Ambulatory Visit (INDEPENDENT_AMBULATORY_CARE_PROVIDER_SITE_OTHER): Payer: BLUE CROSS/BLUE SHIELD | Admitting: Ophthalmology

## 2015-11-16 DIAGNOSIS — H43813 Vitreous degeneration, bilateral: Secondary | ICD-10-CM

## 2015-11-16 DIAGNOSIS — H4423 Degenerative myopia, bilateral: Secondary | ICD-10-CM

## 2015-11-16 DIAGNOSIS — H33302 Unspecified retinal break, left eye: Secondary | ICD-10-CM | POA: Diagnosis not present

## 2015-12-14 ENCOUNTER — Ambulatory Visit (INDEPENDENT_AMBULATORY_CARE_PROVIDER_SITE_OTHER): Payer: BLUE CROSS/BLUE SHIELD | Admitting: Sports Medicine

## 2015-12-14 ENCOUNTER — Encounter: Payer: Self-pay | Admitting: Sports Medicine

## 2015-12-14 ENCOUNTER — Other Ambulatory Visit: Payer: Self-pay | Admitting: *Deleted

## 2015-12-14 VITALS — BP 126/52 | HR 104 | Ht 70.0 in | Wt 178.0 lb

## 2015-12-14 DIAGNOSIS — M25552 Pain in left hip: Secondary | ICD-10-CM | POA: Diagnosis not present

## 2015-12-14 NOTE — Progress Notes (Signed)
  Mandy SchlatterSharon C Hanson - 65 y.o. female MRN [id number]  Date of birth: Mar 08, 1950  SUBJECTIVE:  Including CC & ROS.  Mandy ChesterfieldSharon Hanson is a 65yo female with hx of GERD, bilateral knee OA, and asthma presenting for waxing and waning, left posterior-lateral hip pain onset 6 months ago. The pain is aggravated by walking and standing. She has had some radiation of the pain to her left lateral thigh and reports that it has intermittently radiated to her left ankle. She has used aleve intermittently with relief. Pt reports that she has been to a chiropractor (Dr. Ulysees Barnsoro) for hx of lower back pain since the onset of symptoms and had some relief of pain. She denies groin pain. No history of trauma.     ROS: No weakness, No fever, No chill, No cough, No numbness   HISTORY: Past Medical, Surgical, Social, and Family History Reviewed & Updated per EMR.   Pertinent Historical Findings include: PMSHx - GERD, arthritis, back pain, asthma, hysterectomy, lumpectomy, tonsillectomy  PSHx - Denies tobacco use. Drinks alcohol occasionally. Works as a Clinical research associatelawyer. Medications -aleve  DATA REVIEWED: No data to review   PHYSICAL EXAM:  VS: BP:(!) 126/52  HR:(!) 104bpm  TEMP: ( )  RESP:   HT:5\' 10"  (177.8 cm)   WT:178 lb (80.7 kg)  BMI:25.6 PHYSICAL EXAM: Gen: NAD, alert, cooperative with exam, well-appearing Neuro: no gross deficits.  Left hip: No bony abnormality or swelling noted. Mild tenderness to palpation along greater trochanter and gluteus muscle. ROM full in all directions. L1-S1 strength 5/5. Patellar reflexes +1 and symmetric. Trace achilles reflexes bilaterally.   ASSESSMENT & PLAN:  Hip pain: Likely multiple etiologies including left piriformis and gluteal tendinopathy with greater trochanter bursitis due to posterior-lateral location, TTP and pain aggravated by movements utilizing the gluteus and piriformis. Less likely radiculopathy due to strength 5/5 and sensation intact. Hx of arthritis but less concerning  due to no groin pain and hip ROM full in all fields.  -Recommend physical therapy at [facility]  -Follow up 6 weeks. If pain has not improved in that interval will consider imaging for further evaluation.   Patient seen and evaluated with the medical student. I agree with the above plan of care. I think the patient would benefit greatly from some formal physical therapy. She would like to do this at [facility]. Follow-up with me in 6 weeks for reevaluation. If symptoms persist, consider imaging initially in the form of x-rays.

## 2016-02-01 ENCOUNTER — Ambulatory Visit (INDEPENDENT_AMBULATORY_CARE_PROVIDER_SITE_OTHER): Payer: BLUE CROSS/BLUE SHIELD | Admitting: Sports Medicine

## 2016-02-01 ENCOUNTER — Encounter: Payer: Self-pay | Admitting: Sports Medicine

## 2016-02-01 ENCOUNTER — Other Ambulatory Visit: Payer: Self-pay | Admitting: *Deleted

## 2016-02-01 ENCOUNTER — Ambulatory Visit
Admission: RE | Admit: 2016-02-01 | Discharge: 2016-02-01 | Disposition: A | Discharge status: Home / Self Care | Payer: BLUE CROSS/BLUE SHIELD | Source: Ambulatory Visit | Attending: Sports Medicine | Admitting: Sports Medicine

## 2016-02-01 VITALS — BP 131/66 | HR 78 | Ht 70.0 in | Wt 178.0 lb

## 2016-02-01 DIAGNOSIS — M25561 Pain in right knee: Secondary | ICD-10-CM | POA: Diagnosis not present

## 2016-02-01 DIAGNOSIS — M25562 Pain in left knee: Principal | ICD-10-CM

## 2016-02-01 DIAGNOSIS — G8929 Other chronic pain: Secondary | ICD-10-CM

## 2016-02-01 DIAGNOSIS — M170 Bilateral primary osteoarthritis of knee: Secondary | ICD-10-CM

## 2016-02-01 HISTORY — DX: Bilateral primary osteoarthritis of knee: M17.0

## 2016-02-01 NOTE — Assessment & Plan Note (Signed)
Bilateral knee pain consistent with osteoarthritis especially given her history of bilateral knee injuries - will obtain bilaterla knee films to ascertain how significant her OA is - bilateral knee braces as needed - NSAIDs RPN pain - will discuss injections if she develops more pain

## 2016-02-01 NOTE — Progress Notes (Signed)
   Subjective:    Patient ID: Mandy Hanson, female    DOB: 1950/02/26, 66 y.o.   MRN: [id number]   CC: f/u left hip pain, now with worsening knee pain and stiffness  HPI: 66 y/o F who follow up for left hip pain consistent with left piriformis and gluteal tendinopathy  Left hip pain - Started on physical therapy for presumed left piriformis and gluteal tendinopathy - She feels the PT has greatly helped her hip and her pain has largely resolved  Knee pain - has a long history of intermittent knee pain/stiffness - no knee pain today - history of right tibeal plateau fracture and several left patellar dislocations in the setting of skiing injuries - she denies significant pain associated with this, now is mainly bothered by reduced ROM when applies weigh to the knees - reports significant crepitus bilateraly  Pertinent past medical history:  right tibeal plateau fracture and several left patellar dislocations  Review of Systems  Per HPI  Objective:  BP 131/66   Pulse 78   Ht 5\' 10"  (1.778 m)   Wt 178 lb (80.7 kg)   BMI 25.54 kg/m  Vitals and nursing note reviewed  General: NAD MSK  Left Hip - mild tenderness to palpation over left gluteus muscle - 5/5 bilateral hip flexions, 4/5 bilateral hip abduction - full ROM, no effusion noted  Knees  - no effusions or swelling bilaterally - 5/5 strength to knee flexion and extension, 5/5 bilateral foot plantar/dorsiflexion - bilateral tethered patella - bilateral knee crepitus, left greater than right - full bilateral knee ROM   Assessment & Plan:    Hip Pain- Consistent with piriformis and gluteal tendinopathy with possible greater trochanter bursitis  - Greatly imp[roved with PT - will continue PT as scheduled and follow as needed   Osteoarthritis of knees, bilateral Bilateral knee pain consistent with osteoarthritis especially given her history of bilateral knee injuries - will obtain bilaterla knee films to  ascertain how significant her OA is - bilateral knee braces as needed - NSAIDs RPN pain - will discuss injections if she develops more pain    Jahlon Baines A. Kennon RoundsHaney MD, MS Family Medicine Resident PGY-3 Pager [phone]   Patient seen and evaluated with the resident. I agree with the above plan of care. X-rays were reviewed and they show severe advanced patellofemoral DJD bilaterally. We will start with over-the-counter anti-inflammatories as needed for pain. She has also had some success with compression sleeves in the past so we've given her bilateral body helix compression sleeves to wear with activity. I also think she would benefit from continuing with physical therapy just a little longer to emphasize strengthening of her knees. She can wean to a home exercise program per the therapist's discretion. Her hip pain has improved greatly since her last office visit. She will follow-up as needed.

## 2016-02-15 ENCOUNTER — Ambulatory Visit (INDEPENDENT_AMBULATORY_CARE_PROVIDER_SITE_OTHER): Payer: BLUE CROSS/BLUE SHIELD | Admitting: Ophthalmology

## 2016-03-09 ENCOUNTER — Other Ambulatory Visit: Payer: Self-pay | Admitting: Family Medicine

## 2016-03-09 DIAGNOSIS — Z1231 Encounter for screening mammogram for malignant neoplasm of breast: Secondary | ICD-10-CM

## 2016-03-23 ENCOUNTER — Ambulatory Visit
Admission: RE | Admit: 2016-03-23 | Discharge: 2016-03-23 | Disposition: A | Discharge status: Home / Self Care | Payer: BLUE CROSS/BLUE SHIELD | Source: Ambulatory Visit | Attending: Family Medicine | Admitting: Family Medicine

## 2016-03-23 DIAGNOSIS — Z1231 Encounter for screening mammogram for malignant neoplasm of breast: Secondary | ICD-10-CM

## 2016-11-15 ENCOUNTER — Ambulatory Visit (INDEPENDENT_AMBULATORY_CARE_PROVIDER_SITE_OTHER): Payer: BLUE CROSS/BLUE SHIELD | Admitting: Sports Medicine

## 2016-11-15 DIAGNOSIS — M170 Bilateral primary osteoarthritis of knee: Secondary | ICD-10-CM

## 2016-11-15 NOTE — Assessment & Plan Note (Signed)
Likely due to severe patellofemoral DJD seen on x-rays from 02/01/2016. Patient having the pain as worse with going up stairs and stepping down into a boat. On exam, she has discomfort and crepitus with movement of the patella bilaterally, consistent with patellofemoral DJD. No signs of ligamentous injury. She has failed conservative treatment with formal physical therapy, home rehabilitation exercises, body helix compression sleeves, NSAIDs, and ice. She is interested in trying cortisone injections to see if this will help with her pain. She would like to avoid knee replacement surgery at this time. - Follow up for cortisone injections. Could consider just injecting the left knee (which is more painful) versus injecting both knees at that time. Patient is going on vacation and would like to get the injections after her vacation. - Continue home rehabilitation exercises, compression sleeves, NSAIDs, and ice

## 2016-11-15 NOTE — Progress Notes (Signed)
   [facility] Phone: [phone]  Subjective:  Mandy Hanson is a 66 year old female presenting to sports medicine clinic with bilateral knee pain for the last few years. She feels like the knee pain has gotten worse over the last couple of years. The pain is located in the medial and lateral knee bilaterally. The pain as "achy" and "stiffness". The pain is worse on the left than the right. She also notes decreased mobility and range of motion. She endorses popping and a sensation that her knees are "wobbly". She denies any locking or giving out. She denies any erythema or warmth. She endorses occasional swelling during periods of time when she is more active. She has been using ice and ibuprofen, which has been helping. The pain is worse at night, worse with going up stairs, and worse when stepping down into a boat. She used to walk, ride a bike, and ski, but her knee pain has been preventing her from doing these things recently. She was seen in clinic on 02/01/2016. She had x-rays and at that time that showed severe patellofemoral DJD bilaterally. She was advised to use NSAIDs. She is also given bilateral body helix compression sleeves and was referred to physical therapy. She did physical therapy for about 2 months and is still doing exercises. She feels like the physical therapy is helping to strengthen her knees. She returns to clinic today because she feels like her knee pain has persisted.  ROS: See HPI for pertinent positives and negatives  Objective: BP 108/66   Ht 5\' 10"  (1.778 m)   Wt 175 lb (79.4 kg)   BMI 25.11 kg/m  Gen: NAD, alert, cooperative with exam Knees: No erythema, edema, or gross deformity. Mildly decreased range of motion on the right to 2 of extension and 130 of flexion. Full range of motion of the left. Crepitus present bilaterally. No effusions present. No tenderness along the medial and lateral joint lines. She has tenderness with movement of the  patella bilaterally. Valgus and varus stress test negative. Lachman's test negative. Neuro: Bilateral lower extremities are neurovascularly intact.  Assessment/Plan: Bilateral Knee Pain: Likely due to severe patellofemoral DJD seen on x-rays from 02/01/2016. Patient having the pain as worse with going up stairs and stepping down into a boat. On exam, she has discomfort and crepitus with movement of the patella bilaterally, consistent with patellofemoral DJD. No signs of ligamentous injury. She has failed conservative treatment with formal physical therapy, home rehabilitation exercises, body helix compression sleeves, NSAIDs, and ice. She is interested in trying cortisone injections to see if this will help with her pain. She would like to avoid knee replacement surgery at this time. - Follow up for cortisone injections. Could consider just injecting the left knee (which is more painful) versus injecting both knees at that time. Patient is going on vacation and would like to get the injections after her vacation. - Continue home rehabilitation exercises, compression sleeves, NSAIDs, and ice   Mandy CarolKaty [name], MD PGY-3    Patient seen and evaluated with the resident. I agree with the above plan of care. 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 She is not yet ready to consider arthroplasty. She would like to try cortisone injections but would like to wait until after Thanksgiving. She may follow up at her convenience for the injections.

## 2016-11-28 ENCOUNTER — Ambulatory Visit (INDEPENDENT_AMBULATORY_CARE_PROVIDER_SITE_OTHER): Payer: BLUE CROSS/BLUE SHIELD | Admitting: Sports Medicine

## 2016-11-28 DIAGNOSIS — M170 Bilateral primary osteoarthritis of knee: Secondary | ICD-10-CM | POA: Diagnosis not present

## 2016-11-28 MED ORDER — METHYLPREDNISOLONE ACETATE 40 MG/ML IJ SUSP
40.0000 mg | Freq: Once | INTRAMUSCULAR | Status: AC
  Administered 2016-11-28: 40 mg via INTRA_ARTICULAR

## 2016-11-28 NOTE — Assessment & Plan Note (Signed)
Likely due to severe patellofemoral DJD. - Bilateral cortisone joint injections performed in clinic today. See procedure note below. - Advised patient to ice and use NSAIDs as needed - Follow-up in 4 weeks

## 2016-11-28 NOTE — Progress Notes (Signed)
   [facility] Phone: [phone]  Subjective:  Mandy Hanson is a 66 year old female presenting to clinic for bilateral knee injections. She was seen in clinic 11/15/16. She was interested in knee injections at that time, but wanted to wait until after her vacation to have them done. She states her knee pain is the same. The pain is worse on the left than the right. She thinks she aggravated her knees while going on a hike while on vacation. She had to take it easy for a couple days after that. No redness, no swelling, no warmth.  ROS: See HPI for pertinent positives and negatives  Objective: BP 118/78   Ht 5\' 10"  (1.778 m)   Wt 175 lb (79.4 kg)   BMI 25.11 kg/m  Gen: NAD, alert, cooperative with exam Knees: No erythema, edema, or gross deformity. +tenderness to palpation of the medial and lateral joint lines bilaterally. Neuro: Bilateral lower extremities are neurovascularly intact.  Assessment/Plan: Bilateral Knee Pain: Likely due to severe patellofemoral DJD. - Bilateral cortisone joint injections performed in clinic today. See procedure note below. - Advised patient to ice and use NSAIDs as needed - Follow-up in 4 weeks  Procedure:  Injection of the bilateral knees.  Consent obtained and verified. Time-out conducted. Noted no overlying erythema, induration, or other signs of local infection. Patient not on anticoagulants. Skin prepped in a sterile fashion. Topical analgesic spray: Ethyl chloride. Injection completed without difficulty with Depomedrol 40 mg (1cc) and Lidocaine 1% (4cc).  Pain immediately improved suggesting accurate placement of the medication. Advised to call if fevers/chills, erythema, induration, drainage, or persistent bleeding.   Willadean CarolKaty Mayo, MD PGY-3  Patient seen and evaluated with the resident. I agree with the above plan of care. Patient's knees were injected successfully today without complication. Patient will follow-up in 4 weeks  for reevaluation. Call with questions or concerns in the interim.

## 2016-12-22 ENCOUNTER — Ambulatory Visit (INDEPENDENT_AMBULATORY_CARE_PROVIDER_SITE_OTHER): Payer: BLUE CROSS/BLUE SHIELD | Admitting: Sports Medicine

## 2016-12-22 VITALS — BP 106/72 | Ht 70.0 in | Wt 178.0 lb

## 2016-12-22 DIAGNOSIS — M25552 Pain in left hip: Secondary | ICD-10-CM | POA: Diagnosis not present

## 2016-12-22 DIAGNOSIS — M170 Bilateral primary osteoarthritis of knee: Secondary | ICD-10-CM

## 2016-12-22 DIAGNOSIS — M25551 Pain in right hip: Secondary | ICD-10-CM | POA: Diagnosis not present

## 2016-12-22 MED ORDER — DICLOFENAC SODIUM 1 % TD GEL: 2.0000 g | Freq: Four times a day (QID) | TRANSDERMAL | 0 refills | Status: DC

## 2016-12-23 NOTE — Progress Notes (Signed)
   Subjective:    Patient ID: Mandy Hanson, female    DOB: April 21, 1950, 66 y.o.   MRN: [id number]  HPI   Patient comes in today for follow-up on bilateral knee pain. She is doing much better after recent cortisone injections. Main complaint today is bilateral hip pain. This is not a new problem for her. Pain is along the lateral hip. She has responded well to physical therapy in the past. No pain in the groin. No numbness or tingling.   Review of Systems As above    Objective:   Physical Exam  Well-developed, well-nourished. No acute distress. Awake alert and oriented 3. Vital signs reviewed  Examination of each knee shows good range of motion. No effusion. 2+ patellofemoral crepitus. No tenderness to palpation. Good joint stability. Neurovascularly intact distally.  Examination of each hip shows good hip range of motion. She is tender to palpation along the lateral hip both over the greater trochanteric bursa and at the gluteus medius insertion point. Fairly good strength. Neurovascularly intact distally. Walking without a limp.      Assessment & Plan:   Improved bilateral knee pain secondary to advanced patellofemoral DJD Bilateral hip pain secondary to gluteus medius tendinopathy/intertrochanteric bursitis  Although the patient is doing much better with her knees, definitive treatment is a total knee arthroplasty. She has an appointment with Dr. Lequita HaltAluisio in January to discuss further. In the meantime, I'm going to refer her back over to physical therapy for her lateral hip pain and prescribe her some Voltaren gel (oral NSAIDs cause stomach upset). She can wean to a home exercise program per the therapist's discretion. Follow-up with me as needed.

## 2017-02-08 ENCOUNTER — Other Ambulatory Visit: Payer: Self-pay | Admitting: Obstetrics and Gynecology

## 2017-02-08 DIAGNOSIS — E2839 Other primary ovarian failure: Secondary | ICD-10-CM

## 2017-02-22 ENCOUNTER — Other Ambulatory Visit: Payer: Self-pay | Admitting: Obstetrics and Gynecology

## 2017-02-22 DIAGNOSIS — Z139 Encounter for screening, unspecified: Secondary | ICD-10-CM

## 2017-03-28 ENCOUNTER — Ambulatory Visit
Admission: RE | Admit: 2017-03-28 | Discharge: 2017-03-28 | Disposition: A | Discharge status: Home / Self Care | Payer: BLUE CROSS/BLUE SHIELD | Source: Ambulatory Visit | Attending: Obstetrics and Gynecology | Admitting: Obstetrics and Gynecology

## 2017-03-28 ENCOUNTER — Other Ambulatory Visit: Payer: BLUE CROSS/BLUE SHIELD

## 2017-03-28 ENCOUNTER — Ambulatory Visit: Payer: BLUE CROSS/BLUE SHIELD

## 2017-03-28 DIAGNOSIS — Z139 Encounter for screening, unspecified: Secondary | ICD-10-CM

## 2017-03-28 DIAGNOSIS — E2839 Other primary ovarian failure: Secondary | ICD-10-CM

## 2018-02-05 ENCOUNTER — Ambulatory Visit: Payer: Medicare Other | Admitting: Allergy & Immunology

## 2018-02-05 ENCOUNTER — Encounter: Payer: Self-pay | Admitting: Allergy & Immunology

## 2018-02-05 VITALS — BP 120/64 | HR 72 | Temp 97.7°F | Resp 18 | Ht 70.0 in | Wt 191.6 lb

## 2018-02-05 DIAGNOSIS — J4520 Mild intermittent asthma, uncomplicated: Secondary | ICD-10-CM

## 2018-02-05 DIAGNOSIS — J310 Chronic rhinitis: Secondary | ICD-10-CM | POA: Diagnosis not present

## 2018-02-05 DIAGNOSIS — L239 Allergic contact dermatitis, unspecified cause: Secondary | ICD-10-CM | POA: Diagnosis not present

## 2018-02-05 MED ORDER — ALBUTEROL SULFATE HFA 108 (90 BASE) MCG/ACT IN AERS: 2.0000 | INHALATION_SPRAY | Freq: Four times a day (QID) | RESPIRATORY_TRACT | 1 refills | Status: AC | PRN

## 2018-02-05 NOTE — Progress Notes (Signed)
NEW PATIENT  Date of Service/Encounter:  02/05/18  Referring provider: Merri Brunette, MD   Assessment:   Mild intermittent asthma, uncomplicated  Chronic rhinitis  Allergic contact dermatitis   Mandy Hanson presents for an evaluation of a possible metal allergy.  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  We did place the metal gels today into fin chambers and we will check those in 48 hours as well as 96 hours.  She did request repeat environmental allergy testing, but as her surgery is coming up so soon I figured it was more important to pursue this metal testing instead.  While she does have a history of asthma, her spirometry is normal today and I anticipate that her cough should resolve in a week or 2.  There does not seem to be indication for a controller medication at this time.   Plan/Recommendations:   1. Mild intermittent asthma, uncomplicated - Lung testing looks good today. - Continue with albuterol four puffs every 4-6 hours as needed. - It does not seem that you need a controller medication at this time.   2. Chronic rhinitis - We will update the skin testing at your next visit.  - In the meantime, continue with the two nasal sprays that you are on (Flonase and Astelin).  3. Allergic contact dermatitis - We placed patches today. - Come back on Wednesday and Fridays for readings. - You can use antihistamines during this testing.   4. Return in about 2 days (around 02/07/2018).  Subjective:   Mandy Hanson is a 68 y.o. female presenting today for evaluation of  Chief Complaint  Patient presents with  . Allergy Testing    Nickel  . Nasal Congestion    Mandy Hanson has a history of the following: Patient Active Problem List   Diagnosis Date Noted  . Osteoarthritis of knees, 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  . Transaminitis 01/20/2012  . Epigastric abdominal pain 01/20/2012  . Nausea and vomiting in adult 01/18/2012  . Hypokalemia  01/18/2012  . Diverticulitis of colon (without mention of hemorrhage)(562.11) 01/14/2012  . Hyponatremia 01/14/2012  . GERD (gastroesophageal reflux disease) 01/14/2012    History obtained from: chart review and patient.  Garth Schlatter was referred by Merri Brunette, MD.     Mandy Hanson is a 68 y.o. female presenting for an evaluation of metal allergy. She is scheduled to have her knee replaced two weeks from today. She is going to have both done eventually but each one will take three months to fully get back.   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 They wanted her to come in for an evaluation to check on the presence of metal allergy.    Asthma/Respiratory Symptom History: She has a history of asthma and uses albuterol as needed. She currently is getting over a cold for the past one month. She never gets prednisone for her breathing. She was on a daily medication for asthma - Advair - but it did not seem to make any difference at all.   Allergic Rhinitis Symptom History: She has a history of environmental allergy. Environmental allergies are bad through the year, but worse in the fall. She is allergic to ragweed. She has been seen by Dr. Beaulah Dinning in the past. She is on two nasal sprays as the Singulair at night. She was on shot when she was a child, otherwise no allergen immunotherapy.  It is  hard to read her records, but it seems like her last testing was done in 2007.  She had minimal reactivity to grass, weeds, cat, and dog on intradermal.  She was positive to dust mite and cockroach on prick testing.  Otherwise, there is no history of other atopic diseases, including food allergies, drug allergies, stinging insect allergies or urticaria. There is no significant infectious history. Vaccinations are up to date.    Past Medical History: Patient Active Problem List   Diagnosis Date Noted  . Osteoarthritis of knees, 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  .  Transaminitis 01/20/2012  . Epigastric abdominal pain 01/20/2012  . Nausea and vomiting in adult 01/18/2012  . Hypokalemia 01/18/2012  . Diverticulitis of colon (without mention of hemorrhage)(562.11) 01/14/2012  . Hyponatremia 01/14/2012  . GERD (gastroesophageal reflux disease) 01/14/2012    Medication List:  Allergies as of 02/05/2018   No Known Allergies     Medication List       Accurate as of February 05, 2018  3:25 PM. Always use your most recent med list.        albuterol 108 (90 Base) MCG/ACT inhaler Commonly known as:  PROVENTIL HFA;VENTOLIN HFA Inhale 2 puffs into the lungs every 6 (six) hours as needed for wheezing or shortness of breath.   azelastine 0.1 % nasal spray Commonly known as:  ASTELIN Place 2 sprays into both nostrils 2 (two) times daily.   CALCIUM + VITAMIN D3 PO Take 1 tablet by mouth daily.   CITRUCEL PO Take 2 tablets by mouth daily.   esomeprazole 20 MG capsule Commonly known as:  NEXIUM Take 20 mg by mouth daily.   fluticasone 50 MCG/ACT nasal spray Commonly known as:  FLONASE Place 2 sprays into both nostrils 2 (two) times daily.   lubiprostone 24 MCG capsule Commonly known as:  AMITIZA Take 24 mcg by mouth 2 (two) times daily with a meal.   montelukast 10 MG tablet Commonly known as:  SINGULAIR Take 10 mg by mouth daily.       Birth History: non-contributory  Developmental History: non-contributory.   Past Surgical History: Past Surgical History:  Procedure Laterality Date  . ABDOMINAL HYSTERECTOMY    . ADENOIDECTOMY    . BREAST BIOPSY Right 09/24/2014  . BREAST EXCISIONAL BIOPSY Right 11/20/2014  . BREAST LUMPECTOMY WITH RADIOACTIVE SEED LOCALIZATION Right 11/20/2014   Procedure: RIGHT BREAST LUMPECTOMY WITH RADIOACTIVE SEED LOCALIZATION;  Surgeon: Chevis Pretty III, MD;  Location: [facility];  Service: General;  Laterality: Right;  . ESOPHAGOGASTRODUODENOSCOPY  01/25/2012   Procedure:  ESOPHAGOGASTRODUODENOSCOPY (EGD);  Surgeon: Barrie Folk, MD;  Location: [facility];  Service: Endoscopy;  Laterality: N/A;  . TONSILLECTOMY       Family History: History reviewed. No pertinent family history.   Social History: Azul lives at home with her husband.  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  Her husband is a retired Education officer, community.  They have been doing a lot of travel over the last 6 months, in anticipation of her recovery from her knee surgery.  They live in a house that is 67 years old.  There is hardwood with area rugs in the main living areas and carpeting in the bedroom.  They have gas heating and central cooling.  There is one cat in the home and they have always had animals.  There are no dust mite covers on the bedding.  There is no tobacco exposure.    Review of Systems:  a 14-point review of systems is pertinent for what is mentioned in HPI.  Otherwise, all other systems were negative. Constitutional: negative other than that listed in the HPI Eyes: negative other than that listed in the HPI Ears, nose, mouth, throat, and face: negative other than that listed in the HPI Respiratory: negative other than that listed in the HPI Cardiovascular: negative other than that listed in the HPI Gastrointestinal: negative other than that listed in the HPI Genitourinary: negative other than that listed in the HPI Integument: negative other than that listed in the HPI Hematologic: negative other than that listed in the HPI Musculoskeletal: negative other than that listed in the HPI Neurological: negative other than that listed in the HPI Allergy/Immunologic: negative other than that listed in the HPI    Objective:   Blood pressure 120/64, pulse 72, temperature 97.7 F (36.5 C), temperature source Oral, resp. rate 18, height 5\' 10"  (1.778 m), weight 191 lb 9.6 oz (86.9 kg), SpO2 98 %. Body mass index is 27.49 kg/m.   Physical Exam:  General: Alert,  interactive, in no acute distress. Pleasant female.  Eyes: No conjunctival injection bilaterally, no discharge on the right, no discharge on the left and no Horner-Trantas dots present. PERRL bilaterally. EOMI without pain. No photophobia.  Ears: Right TM pearly gray with normal light reflex, Left TM pearly gray with normal light reflex, Right TM intact without perforation and Left TM intact without perforation.  Nose/Throat: External nose within normal limits and septum midline. Turbinates edematous and pale with clear discharge. Posterior oropharynx erythematous without cobblestoning in the posterior oropharynx. Tonsils 2+ without exudates.  Tongue without thrush. Neck: Supple without thyromegaly. Trachea midline. Adenopathy: no enlarged lymph nodes appreciated in the anterior cervical, occipital, axillary, epitrochlear, inguinal, or popliteal regions. Lungs: Clear to auscultation without wheezing, rhonchi or rales. No increased work of breathing. CV: Normal S1/S2. No murmurs. Capillary refill <2 seconds.  Abdomen: Nondistended, nontender. No guarding or rebound tenderness. Bowel sounds present in all fields and hypoactive  Skin: Warm and dry, without lesions or rashes. Extremities:  No clubbing, cyanosis or edema. Neuro:   Grossly intact. No focal deficits appreciated. Responsive to questions.  Diagnostic studies:   Spirometry: Normal FEV1, FVC, and FEV1/FVC ratio. There is no scooping suggestive of obstructive disease.    Metal patches placed.       Malachi BondsJoel Gallagher, MD [facility] [place] [place]

## 2018-02-05 NOTE — Patient Instructions (Addendum)
1. Mild intermittent asthma, uncomplicated - Lung testing looks good today. - Continue with albuterol four puffs every 4-6 hours as needed. - It does not seem that you need a controller medication at this time.   2. Chronic rhinitis - We will update the skin testing at your next visit.  - In the meantime, continue with the two nasal sprays that you are on (Flonase and Astelin).  3. Allergic contact dermatitis - We placed patches today. - Come back on Wednesday and Fridays for readings. - You can use antihistamines during this testing.   4. Return in about 2 days (around 02/07/2018).   Please inform us of any Emergency Department visits, hospitalizations, or changes in symptoms. Call us before going to the ED for breathing or allergy symptoms since we might be able to fit you in for a sick visit. Feel free to contact us anytime with any questions, problems, or concerns.  It was a pleasure to meet you today!  Websites that have reliable patient information: 1. American Academy of Asthma, Allergy, and Immunology: www.aaaai.org 2. Food Allergy Research and Education (FARE): foodallergy.org 3. Mothers of Asthmatics: http://www.asthmacommunitynetwork.org 4. American College of Allergy, Asthma, and Immunology: MissingWeapons.ca   Make sure you are registered to vote! If you have moved or changed any of your contact information, you will need to get this updated before voting!    Voter ID laws are POSSIBLY going into effect for the General Election in November 2020! Be prepared! Check out Landscaping[employer]est.dk for more details.

## 2018-02-07 ENCOUNTER — Encounter (HOSPITAL_COMMUNITY): Payer: Self-pay | Admitting: Student

## 2018-02-07 ENCOUNTER — Ambulatory Visit: Payer: Medicare Other | Admitting: Allergy

## 2018-02-07 ENCOUNTER — Encounter: Payer: Self-pay | Admitting: Allergy

## 2018-02-07 DIAGNOSIS — L239 Allergic contact dermatitis, unspecified cause: Secondary | ICD-10-CM

## 2018-02-07 NOTE — Assessment & Plan Note (Signed)
Metal patch test initial 48 hour read did not show any positives.

## 2018-02-07 NOTE — Progress Notes (Signed)
   Follow Up Note  RE: Mandy Hanson MRN: [id number] DOB: Feb 11, 1950 Date of Office Visit: 02/07/2018  Referring provider: Merri Brunette, MD Primary care provider: Merri Brunette, MD  History of Present Illness: I had the pleasure of seeing Mandy Hanson for a [facility]  on 02/07/[facility]. She is a 68 y.o. female, who is being followed for dermatitis, asthma, chronic rhinitis. Today she is here for initial patch test interpretation, given suspected history of contact dermatitis.   Diagnostics:  Metal patch pasting 48 hour reading:  Metals Patch - 02/07/18 1332    Time Antigen Placed  1100    Manufacturer  Other   [employer]   Location  Back    Number of Test  11    Lot #  [id number]    Reading Interval  Day 5    Select  Select    Chromium chloride 1%  0    Cobalt chloride hexahydrate 1%  0    Molybdenum chloride 0.5%  0    Nickel sulfate hexahydrate 5%  0    Potassium dichromate 0.25%  0    Copper sulfate pentahydrate 2%  0    Titanium 0.1%  0    Manganese chloride 0.5%  0    Other  0    Other  0        Assessment and Plan: Mandy Hanson is a 68 y.o. female with: Allergic contact dermatitis Metal patch test initial 48 hour read did not show any positives.   Return in about 2 days (around 02/09/2018) for Patch reading.  It was my pleasure to see Mandy Hanson today and participate in her care. Please feel free to contact me with any questions or concerns.  Sincerely,  Wyline Mood, DO Allergy & Immunology  [facility] office: [phone] [facility] office:[phone]

## 2018-02-07 NOTE — H&P (Signed)
TOTAL KNEE ADMISSION H&P  Patient is being admitted for left total knee arthroplasty.  Subjective:  Chief Complaint:left knee pain.  HPI: Mandy SchlatterSharon C Hanson, 68 y.o. female, has a history of pain and functional disability in the left knee due to arthritis and has failed non-surgical conservative treatments for greater than 12 weeks to includecorticosteriod injections and activity modification.  Onset of symptoms was gradual, starting >10 years ago with gradually worsening course since that time. The patient noted no past surgery on the left knee(s).  Patient currently rates pain in the left knee(s) at 4 out of 10 with activity. Patient has worsening of pain with activity and weight bearing, pain that interferes with activities of daily living, crepitus and joint swelling.  Patient has evidence of severe patellofemoral arthritis bilaterally with medial and lateral marginal osteophytes. by imaging studies. There is no active infection.  Patient Active Problem List   Diagnosis Date Noted  . Allergic contact dermatitis 02/07/2018  . Osteoarthritis of knees, 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  . Transaminitis 01/20/2012  . Epigastric abdominal pain 01/20/2012  . Nausea and vomiting in adult 01/18/2012  . Hypokalemia 01/18/2012  . Diverticulitis of colon (without mention of hemorrhage)(562.11) 01/14/2012  . Hyponatremia 01/14/2012  . GERD (gastroesophageal reflux disease) 01/14/2012   Past Medical History:  Diagnosis Date  . Asthma   . Diverticulosis   . Esophagitis   . GERD (gastroesophageal reflux disease)   . Osteoarthritis of knees, 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  . Recurrent upper respiratory infection (URI)     Past Surgical History:  Procedure Laterality Date  . ABDOMINAL HYSTERECTOMY    . ADENOIDECTOMY    . BREAST BIOPSY Right 09/24/2014  . BREAST EXCISIONAL BIOPSY Right 11/20/2014  . BREAST LUMPECTOMY WITH RADIOACTIVE SEED LOCALIZATION Right 11/20/2014   Procedure: RIGHT BREAST LUMPECTOMY WITH  RADIOACTIVE SEED LOCALIZATION;  Surgeon: Chevis PrettyPaul Toth III, MD;  Location: [facility];  Service: General;  Laterality: Right;  . ESOPHAGOGASTRODUODENOSCOPY  01/25/2012   Procedure: ESOPHAGOGASTRODUODENOSCOPY (EGD);  Surgeon: Barrie FolkJohn C Hayes, MD;  Location: [facility];  Service: Endoscopy;  Laterality: N/A;  . TONSILLECTOMY      No current facility-administered medications for this encounter.    Current Outpatient Medications  Medication Sig Dispense Refill Last Dose  . albuterol (PROVENTIL HFA;VENTOLIN HFA) 108 (90 Base) MCG/ACT inhaler Inhale 2 puffs into the lungs every 6 (six) hours as needed for wheezing or shortness of breath.   Taking  . azelastine (ASTELIN) 137 MCG/SPRAY nasal spray Place 2 sprays into both nostrils 2 (two) times daily.    Taking  . Calcium Carb-Cholecalciferol (CALCIUM + VITAMIN D3 PO) Take 1 tablet by mouth daily.   Taking  . esomeprazole (NEXIUM) 20 MG capsule Take 20 mg by mouth daily.    Taking  . fluticasone (FLONASE) 50 MCG/ACT nasal spray Place 2 sprays into both nostrils 2 (two) times daily.   Taking  . lubiprostone (AMITIZA) 24 MCG capsule Take 24 mcg by mouth 2 (two) times daily with a meal.   Taking  . Methylcellulose, Laxative, (CITRUCEL PO) Take 2 tablets by mouth daily.   Taking  . montelukast (SINGULAIR) 10 MG tablet Take 10 mg by mouth daily.    Taking  . albuterol (PROVENTIL HFA;VENTOLIN HFA) 108 (90 Base) MCG/ACT inhaler Inhale 2 puffs into the lungs every 6 (six) hours as needed for wheezing or shortness of breath. 3 Inhaler 1    No Known Allergies  Social History   Tobacco Use  . Smoking status: Never Smoker  .  Smokeless tobacco: Never Used  Substance Use Topics  . Alcohol use: No    No family history on file.   ROS Constitutional: Constitutional: no fever, chills, night sweats, or significant weight loss.  Cardiovascular: Cardiovascular: no palpitations or chest pain.  Respiratory: Respiratory: no cough or shortness of breath  and No COPD.  Gastrointestinal: Gastrointestinal: no vomiting or nausea.  Musculoskeletal: Musculoskeletal: no swelling in Joints and Joint Pain.  Neurologic: Neurologic: no numbness, tingling, or difficulty with balance.  Objective:  Physical Exam Patient is a 68 year old female.  Well nourished and well developed. General: Alert and oriented x3, cooperative and pleasant, no acute distress. Head: normocephalic, atraumatic, neck supple. Eyes: EOMI. Respiratory: breath sounds clear in all fields, no wheezing, rales, or rhonchi. Cardiovascular: Regular rate and rhythm, no murmurs, gallops or rubs. Abdomen: non-tender to palpation and soft, normoactive bowel sounds. Musculoskeletal:  Left Knee Exam: No effusion. Range of motion is 0-125 degrees. Marked crepitus on range of motion of the knee. Positive anterior tenderness. No medial joint line tenderness. No lateral joint line tenderness. Stable knee.  Calves soft and nontender. Motor function intact in LE. Strength 5/5 LE bilaterally. Neuro: Distal pulses 2+. Sensation to light touch intact in LE.  Vital signs in last 24 hours:    Labs:   Estimated body mass index is 27.49 kg/m as calculated from the following:   Height as of 02/05/18: 5\' 10"  (1.778 m).   Weight as of 02/05/18: 86.9 kg.   Imaging Review Plain radiographs demonstrate severe degenerative joint disease of the left knee(s). The overall alignment isneutral. The bone quality appears to be adequate for age and reported activity level.   Preoperative templating of the joint replacement has been completed, documented, and submitted to the Operating Room personnel in order to optimize intra-operative equipment management.   Anticipated LOS equal to or greater than 2 midnights due to - Age 67 and older with one or more of the following:  - Obesity  - Expected need for hospital services (PT, OT, Nursing) required for safe  discharge  - Anticipated need for  postoperative skilled nursing care or inpatient rehab  - Active co-morbidities: None OR   - Unanticipated findings during/Post Surgery: None  - Patient is a high risk of re-admission due to: None     Assessment/Plan:  End stage arthritis, left knee   The patient history, physical examination, clinical judgment of the provider and imaging studies are consistent with end stage degenerative joint disease of the left knee(s) and total knee arthroplasty is deemed medically necessary. The treatment options including medical management, injection therapy arthroscopy and arthroplasty were discussed at length. The risks and benefits of total knee arthroplasty were presented and reviewed. The risks due to aseptic loosening, infection, stiffness, patella tracking problems, thromboembolic complications and other imponderables were discussed. The patient acknowledged the explanation, agreed to proceed with the plan and consent was signed. Patient is being admitted for inpatient treatment for surgery, pain control, PT, OT, prophylactic antibiotics, VTE prophylaxis, progressive ambulation and ADL's and discharge planning. The patient is planning to be discharged home with outpatient therapy.   - Patient was instructed on what medications to stop prior to surgery. - Follow-up visit in 2 weeks with Dr. Lequita HaltAluisio - Begin physical therapy following surgery - Pre-operative lab work as pre-surgical testing - Prescriptions will be provided in hospital at time of discharge   Dennie BibleAshley Zimri Brennen, PA-C Orthopedic Surgery [facility] [place]

## 2018-02-08 ENCOUNTER — Encounter (INDEPENDENT_AMBULATORY_CARE_PROVIDER_SITE_OTHER): Payer: BLUE CROSS/BLUE SHIELD | Admitting: Ophthalmology

## 2018-02-09 ENCOUNTER — Encounter: Payer: Self-pay | Admitting: Family Medicine

## 2018-02-09 ENCOUNTER — Ambulatory Visit: Payer: Medicare Other | Admitting: Family Medicine

## 2018-02-09 DIAGNOSIS — L239 Allergic contact dermatitis, unspecified cause: Secondary | ICD-10-CM

## 2018-02-09 NOTE — Progress Notes (Signed)
   [address] [place] Presto [place] [address] Dept: [phone]  FOLLOW UP NOTE  Patient ID: Mandy Hanson, female    DOB: 11/28/1950  Age: 68 y.o. MRN: [id number] Date of Office Visit: 02/09/2018  Assessment  Chief Complaint: Allergy Testing (Patch Test)  HPI Mandy Hanson is a 68 year old female who presents to the clinic for a final metals patch test reading. She is being followed for dermatitis, asthma, and chronic rhinitis.   Drug Allergies:  No Known Allergies  Diagnostics: Diagnostics: Metal patch pasting48 hour reading:       Metals Patch - 02/07/18 1332    Location  Back    Number of Tests  11    Reading Interval  Day    Chromium chloride 1%  0   Potassium dichromate 0.25%  0   Cobalt chloride hexahydrate 1%  0   Copper sulfate pentahydrate 2%  0   Molybdenum chloride 0.5%  0   Titanium 0.1%  0   Manganese chloride 0.5%  0   Nickel sulfate hexahydrate 5%  0   Aluminum hydroxide 10%  0   Vanadium pentoxide 10%  0     Assessment and Plan: 1. Allergic contact dermatitis, unspecified trigger      Patient Instructions  Metals patch testing was negative in it's entirity at the 96 hour reading.   Call us if your symptoms worsen or if you have any questions.   Follow up in 6 months or sooner if needed   Return in about 6 months (around 08/10/2018), or if symptoms worsen or fail to improve.    Thank you for the opportunity to care for this patient.  Please do not hesitate to contact me with questions.  Thermon Leyland, FNP [facility]

## 2018-02-09 NOTE — Patient Instructions (Addendum)
Metals patch testing was negative in it's entirity at the 96 hour reading.   Call us if your symptoms worsen or if you have any questions.   Follow up in 6 months or sooner if needed

## 2018-02-12 ENCOUNTER — Encounter (INDEPENDENT_AMBULATORY_CARE_PROVIDER_SITE_OTHER): Payer: Medicare Other | Admitting: Ophthalmology

## 2018-02-12 DIAGNOSIS — H43813 Vitreous degeneration, bilateral: Secondary | ICD-10-CM | POA: Diagnosis not present

## 2018-02-12 DIAGNOSIS — H26493 Other secondary cataract, bilateral: Secondary | ICD-10-CM | POA: Diagnosis not present

## 2018-02-12 DIAGNOSIS — H2703 Aphakia, bilateral: Secondary | ICD-10-CM

## 2018-02-12 DIAGNOSIS — H33332 Multiple defects of retina without detachment, left eye: Secondary | ICD-10-CM

## 2018-02-12 DIAGNOSIS — H4423 Degenerative myopia, bilateral: Secondary | ICD-10-CM

## 2018-02-12 NOTE — Patient Instructions (Addendum)
Mandy Hanson  02/12/2018   Your procedure is scheduled on: 02-19-18    Report to [facility] Main  Entrance    Report to Admitting at 5:50 AM    Call this number if you have problems the morning of surgery [phone]    Remember: Do not eat food or drink liquids :After Midnight.    BRUSH YOUR TEETH MORNING OF SURGERY AND RINSE YOUR MOUTH OUT, NO CHEWING GUM CANDY OR MINTS.     Take these medicines the morning of surgery with A SIP OF WATER: Esomeprazole (Nexium). You may also use and bring your inhaler and nasal spray.                                You may not have any metal on your body including hair pins and              piercings  Do not wear jewelry, make-up, lotions, powders or perfumes, deodorant             Do not wear nail polish.  Do not shave  48 hours prior to surgery.           Do not bring valuables to the hospital. [facility] IS NOT             RESPONSIBLE   FOR VALUABLES.  Contacts, dentures or bridgework may not be worn into surgery.  Leave suitcase in the car. After surgery it may be brought to your room.     Patients discharged the day of surgery will not be allowed to drive home. IF YOU ARE HAVING SURGERY AND GOING HOME THE SAME DAY, YOU MUST HAVE AN ADULT TO DRIVE YOU HOME AND BE WITH YOU FOR 24 HOURS. YOU MAY GO HOME BY TAXI OR UBER OR ORTHERWISE, BUT AN ADULT MUST ACCOMPANY YOU HOME AND STAY WITH YOU FOR 24 HOURS.   Special Instructions: N/A              Please read over the following fact sheets you were given: _____________________________________________________________________             [facility] - Preparing for Surgery Before surgery, you can play an important role.  Because skin is not sterile, your skin needs to be as free of germs as possible.  You can reduce the number of germs on your skin by washing with CHG (chlorahexidine gluconate) soap before surgery.  CHG is an antiseptic cleaner which kills germs and  bonds with the skin to continue killing germs even after washing. Please DO NOT use if you have an allergy to CHG or antibacterial soaps.  If your skin becomes reddened/irritated stop using the CHG and inform your nurse when you arrive at Short Stay. Do not shave (including legs and underarms) for at least 48 hours prior to the first CHG shower.  You may shave your face/neck. Please follow these instructions carefully:  1.  Shower with CHG Soap the night before surgery and the  morning of Surgery.  2.  If you choose to wash your hair, wash your hair first as usual with your  normal  shampoo.  3.  After you shampoo, rinse your hair and body thoroughly to remove the  shampoo.  4.  Use CHG as you would any other liquid soap.  You can apply chg directly  to the skin and wash                       Gently with a scrungie or clean washcloth.  5.  Apply the CHG Soap to your body ONLY FROM THE NECK DOWN.   Do not use on face/ open                           Wound or open sores. Avoid contact with eyes, ears mouth and genitals (private parts).                       Wash face,  Genitals (private parts) with your normal soap.             6.  Wash thoroughly, paying special attention to the area where your surgery  will be performed.  7.  Thoroughly rinse your body with warm water from the neck down.  8.  DO NOT shower/wash with your normal soap after using and rinsing off  the CHG Soap.                9.  Pat yourself dry with a clean towel.            10.  Wear clean pajamas.            11.  Place clean sheets on your bed the night of your first shower and do not  sleep with pets. Day of Surgery : Do not apply any lotions/deodorants the morning of surgery.  Please wear clean clothes to the hospital/surgery center.  FAILURE TO FOLLOW THESE INSTRUCTIONS MAY RESULT IN THE CANCELLATION OF YOUR SURGERY PATIENT SIGNATURE_________________________________  NURSE  SIGNATURE__________________________________  ________________________________________________________________________   Mandy Hanson  An incentive spirometer is a tool that can help keep your lungs clear and active. This tool measures how well you are filling your lungs with each breath. Taking long deep breaths may help reverse or decrease the chance of developing breathing (pulmonary) problems (especially infection) following:  A long period of time when you are unable to move or be active. BEFORE THE PROCEDURE   If the spirometer includes an indicator to show your best effort, your nurse or respiratory therapist will set it to a desired goal.  If possible, sit up straight or lean slightly forward. Try not to slouch.  Hold the incentive spirometer in an upright position. INSTRUCTIONS FOR USE  1. Sit on the edge of your bed if possible, or sit up as far as you can in bed or on a chair. 2. Hold the incentive spirometer in an upright position. 3. Breathe out normally. 4. Place the mouthpiece in your mouth and seal your lips tightly around it. 5. Breathe in slowly and as deeply as possible, raising the piston or the ball toward the top of the column. 6. Hold your breath for 3-5 seconds or for as long as possible. Allow the piston or ball to fall to the bottom of the column. 7. Remove the mouthpiece from your mouth and breathe out normally. 8. Rest for a few seconds and repeat Steps 1 through 7 at least 10 times every 1-2 hours when you are awake. Take your time and take a few normal breaths between deep breaths. 9. The spirometer may include an indicator to show  your best effort. Use the indicator as a goal to work toward during each repetition. 10. After each set of 10 deep breaths, practice coughing to be sure your lungs are clear. If you have an incision (the cut made at the time of surgery), support your incision when coughing by placing a pillow or rolled up towels firmly  against it. Once you are able to get out of bed, walk around indoors and cough well. You may stop using the incentive spirometer when instructed by your caregiver.  RISKS AND COMPLICATIONS  Take your time so you do not get dizzy or light-headed.  If you are in pain, you may need to take or ask for pain medication before doing incentive spirometry. It is harder to take a deep breath if you are having pain. AFTER USE  Rest and breathe slowly and easily.  It can be helpful to keep track of a log of your progress. Your caregiver can provide you with a simple table to help with this. If you are using the spirometer at home, follow these instructions: [place] IF:   You are having difficultly using the spirometer.  You have trouble using the spirometer as often as instructed.  Your pain medication is not giving enough relief while using the spirometer.  You develop fever of 100.5 F (38.1 C) or higher. SEEK IMMEDIATE MEDICAL CARE IF:   You cough up bloody sputum that had not been present before.  You develop fever of 102 F (38.9 C) or greater.  You develop worsening pain at or near the incision site. MAKE SURE YOU:   Understand these instructions.  Will watch your condition.  Will get help right away if you are not doing well or get worse. Document Released: 05/02/2006 Document Revised: 03/14/2011 Document Reviewed: 07/03/2006 Exit[employer] Patient Information 2014 ExitCare, [place].   ________________________________________________________________________  WHAT IS A BLOOD TRANSFUSION? Blood Transfusion Information  A transfusion is the replacement of blood or some of its parts. Blood is made up of multiple cells which provide different functions.  Red blood cells carry oxygen and are used for blood loss replacement.  White blood cells fight against infection.  Platelets control bleeding.  Plasma helps clot blood.  Other blood products are available for  specialized needs, such as hemophilia or other clotting disorders. BEFORE THE TRANSFUSION  Who gives blood for transfusions?   Healthy volunteers who are fully evaluated to make sure their blood is safe. This is blood bank blood. Transfusion therapy is the safest it has ever been in the practice of medicine. Before blood is taken from a donor, a complete history is taken to make sure that person has no history of diseases nor engages in risky social behavior (examples are intravenous drug use or sexual activity with multiple partners). The donor's travel history is screened to minimize risk of transmitting infections, such as malaria. The donated blood is tested for signs of infectious diseases, such as HIV and hepatitis. The blood is then tested to be sure it is compatible with you in order to minimize the chance of a transfusion reaction. If you or a relative donates blood, this is often done in anticipation of surgery and is not appropriate for emergency situations. It takes many days to process the donated blood. RISKS AND COMPLICATIONS Although transfusion therapy is very safe and saves many lives, the main dangers of transfusion include:   Getting an infectious disease.  Developing a transfusion reaction. This is an allergic reaction to  something in the blood you were given. Every precaution is taken to prevent this. The decision to have a blood transfusion has been considered carefully by your caregiver before blood is given. Blood is not given unless the benefits outweigh the risks. AFTER THE TRANSFUSION  Right after receiving a blood transfusion, you will usually feel much better and more energetic. This is especially true if your red blood cells have gotten low (anemic). The transfusion raises the level of the red blood cells which carry oxygen, and this usually causes an energy increase.  The nurse administering the transfusion will monitor you carefully for complications. HOME CARE  INSTRUCTIONS  No special instructions are needed after a transfusion. You may find your energy is better. Speak with your caregiver about any limitations on activity for underlying diseases you may have. SEEK MEDICAL CARE IF:   Your condition is not improving after your transfusion.  You develop redness or irritation at the intravenous (IV) site. SEEK IMMEDIATE MEDICAL CARE IF:  Any of the following symptoms occur over the next 12 hours:  Shaking chills.  You have a temperature by mouth above 102 F (38.9 C), not controlled by medicine.  Chest, back, or muscle pain.  People around you feel you are not acting correctly or are confused.  Shortness of breath or difficulty breathing.  Dizziness and fainting.  You get a rash or develop hives.  You have a decrease in urine output.  Your urine turns a dark color or changes to pink, red, or brown. Any of the following symptoms occur over the next 10 days:  You have a temperature by mouth above 102 F (38.9 C), not controlled by medicine.  Shortness of breath.  Weakness after normal activity.  The white part of the eye turns yellow (jaundice).  You have a decrease in the amount of urine or are urinating less often.  Your urine turns a dark color or changes to pink, red, or brown. Document Released: 12/18/1999 Document Revised: 03/14/2011 Document Reviewed: 08/06/2007 [facility] Patient Information 2014 [place], [place].  _______________________________________________________________________

## 2018-02-13 ENCOUNTER — Other Ambulatory Visit: Payer: Self-pay

## 2018-02-13 ENCOUNTER — Encounter (HOSPITAL_COMMUNITY): Payer: Self-pay

## 2018-02-13 ENCOUNTER — Encounter (HOSPITAL_COMMUNITY)
Admission: RE | Admit: 2018-02-13 | Discharge: 2018-02-13 | Disposition: A | Discharge status: Home / Self Care | Payer: Medicare Other | Source: Ambulatory Visit | Attending: Orthopedic Surgery | Admitting: Orthopedic Surgery

## 2018-02-13 DIAGNOSIS — Z01812 Encounter for preprocedural laboratory examination: Secondary | ICD-10-CM | POA: Diagnosis not present

## 2018-02-13 LAB — SURGICAL PCR SCREEN
MRSA, PCR: NEGATIVE
Staphylococcus aureus: NEGATIVE

## 2018-02-13 LAB — COMPREHENSIVE METABOLIC PANEL
ALT: 25 U/L (ref 0–44)
AST: 33 U/L (ref 15–41)
Albumin: 4.5 g/dL (ref 3.5–5.0)
Alkaline Phosphatase: 54 U/L (ref 38–126)
Anion gap: 9 (ref 5–15)
BUN: 12 mg/dL (ref 8–23)
CO2: 25 mmol/L (ref 22–32)
Calcium: 9.3 mg/dL (ref 8.9–10.3)
Chloride: 98 mmol/L (ref 98–111)
Creatinine, Ser: 0.82 mg/dL (ref 0.44–1.00)
GFR calc Af Amer: >60 mL/min (ref >60)
GFR calc non Af Amer: >60 mL/min (ref >60)
Glucose, Bld: 106 mg/dL — ABNORMAL HIGH (ref 70–99)
Potassium: 4.7 mmol/L (ref 3.5–5.1)
Sodium: 132 mmol/L — ABNORMAL LOW (ref 135–145)
Total Bilirubin: 0.7 mg/dL (ref 0.3–1.2)
Total Protein: 7.7 g/dL (ref 6.5–8.1)

## 2018-02-13 LAB — PROTIME-INR
INR: 0.9
Prothrombin Time: 12.1 seconds (ref 11.4–15.2)

## 2018-02-13 LAB — CBC
HCT: 41.3 % (ref 36.0–46.0)
Hemoglobin: 13.3 g/dL (ref 12.0–15.0)
MCH: 32.3 pg (ref 26.0–34.0)
MCHC: 32.2 g/dL (ref 30.0–36.0)
MCV: 100.2 fL — ABNORMAL HIGH (ref 80.0–100.0)
Platelets: 321 10*3/uL (ref 150–400)
RBC: 4.12 MIL/uL (ref 3.87–5.11)
RDW: 13 % (ref 11.5–15.5)
WBC: 4.8 10*3/uL (ref 4.0–10.5)
nRBC: 0 % (ref 0.0–0.2)

## 2018-02-13 LAB — ABO/RH: ABO/RH(D): O POS

## 2018-02-13 LAB — APTT: aPTT: 35 seconds (ref 24–36)

## 2018-02-18 MED ORDER — BUPIVACAINE LIPOSOME 1.3 % IJ SUSP
20.0000 mL | Freq: Once | INTRAMUSCULAR | Status: DC
  Filled 2018-02-18: qty 20

## 2018-02-18 NOTE — Anesthesia Preprocedure Evaluation (Addendum)
Anesthesia Evaluation  Patient identified by MRN, date of birth, ID band Patient awake    Reviewed: Allergy & Precautions, NPO status , Patient's Chart, lab work & pertinent test results  Airway Mallampati: II  TM Distance: >3 FB Neck ROM: Full    Dental no notable dental hx. (+) Dental Advisory Given   Pulmonary asthma ,    Pulmonary exam normal        Cardiovascular negative cardio ROS Normal cardiovascular exam     Neuro/Psych negative neurological ROS     GI/Hepatic Neg liver ROS, GERD  ,  Endo/Other  negative endocrine ROS  Renal/GU negative Renal ROS     Musculoskeletal negative musculoskeletal ROS (+)   Abdominal   Peds  Hematology negative hematology ROS (+)   Anesthesia Other Findings Day of surgery medications reviewed with the patient.  Reproductive/Obstetrics                            Anesthesia Physical Anesthesia Plan  ASA: II  Anesthesia Plan: Spinal   Post-op Pain Management:  [facility] for Post-op pain   Induction:   PONV Risk Score and Plan: 2 and Ondansetron and Propofol infusion  Airway Management Planned: Natural Airway  Additional Equipment:   Intra-op Plan:   Post-operative Plan:   Informed Consent: I have reviewed the patients History and Physical, chart, labs and discussed the procedure including the risks, benefits and alternatives for the proposed anesthesia with the patient or authorized representative who has indicated his/her understanding and acceptance.     Dental advisory given  Plan Discussed with: CRNA and Anesthesiologist  Anesthesia Plan Comments:        Anesthesia Quick Evaluation

## 2018-02-19 ENCOUNTER — Ambulatory Visit (HOSPITAL_COMMUNITY): Payer: Medicare Other | Admitting: Physician Assistant

## 2018-02-19 ENCOUNTER — Ambulatory Visit (HOSPITAL_COMMUNITY): Payer: Medicare Other | Admitting: Anesthesiology

## 2018-02-19 ENCOUNTER — Other Ambulatory Visit: Payer: Self-pay

## 2018-02-19 ENCOUNTER — Observation Stay (HOSPITAL_COMMUNITY)
Admission: RE | Admit: 2018-02-19 | Discharge: 2018-02-20 | Disposition: A | Discharge status: Home / Self Care | Payer: Medicare Other | Source: Other Acute Inpatient Hospital | Attending: Orthopedic Surgery | Admitting: Orthopedic Surgery

## 2018-02-19 ENCOUNTER — Encounter (HOSPITAL_COMMUNITY): Payer: Self-pay | Admitting: *Deleted

## 2018-02-19 ENCOUNTER — Encounter (HOSPITAL_COMMUNITY)
Admission: RE | Disposition: A | Discharge status: Home / Self Care | Payer: Self-pay | Source: Other Acute Inpatient Hospital | Attending: Orthopedic Surgery

## 2018-02-19 DIAGNOSIS — M25562 Pain in left knee: Secondary | ICD-10-CM | POA: Diagnosis present

## 2018-02-19 DIAGNOSIS — M1712 Unilateral primary osteoarthritis, left knee: Principal | ICD-10-CM | POA: Insufficient documentation

## 2018-02-19 DIAGNOSIS — M170 Bilateral primary osteoarthritis of knee: Secondary | ICD-10-CM

## 2018-02-19 DIAGNOSIS — M25762 Osteophyte, left knee: Secondary | ICD-10-CM | POA: Insufficient documentation

## 2018-02-19 DIAGNOSIS — K219 Gastro-esophageal reflux disease without esophagitis: Secondary | ICD-10-CM | POA: Insufficient documentation

## 2018-02-19 DIAGNOSIS — J45909 Unspecified asthma, uncomplicated: Secondary | ICD-10-CM | POA: Diagnosis not present

## 2018-02-19 DIAGNOSIS — Z7951 Long term (current) use of inhaled steroids: Secondary | ICD-10-CM | POA: Diagnosis not present

## 2018-02-19 DIAGNOSIS — M179 Osteoarthritis of knee, unspecified: Secondary | ICD-10-CM | POA: Diagnosis present

## 2018-02-19 DIAGNOSIS — Z79899 Other long term (current) drug therapy: Secondary | ICD-10-CM | POA: Insufficient documentation

## 2018-02-19 DIAGNOSIS — M1710 Unilateral primary osteoarthritis, unspecified knee: Secondary | ICD-10-CM | POA: Diagnosis present

## 2018-02-19 HISTORY — PX: TOTAL KNEE ARTHROPLASTY: SHX125

## 2018-02-19 LAB — TYPE AND SCREEN
ABO/RH(D): O POS
ANTIBODY SCREEN: NEGATIVE

## 2018-02-19 SURGERY — ARTHROPLASTY, KNEE, TOTAL
Anesthesia: General | Laterality: Left

## 2018-02-19 MED ORDER — FENTANYL CITRATE (PF) 100 MCG/2ML IJ SOLN
25.0000 ug | INTRAMUSCULAR | Status: DC | PRN
  Administered 2018-02-19 (×2): 50 ug via INTRAVENOUS

## 2018-02-19 MED ORDER — EPHEDRINE 5 MG/ML INJ
INTRAVENOUS | Status: AC
  Filled 2018-02-19: qty 10

## 2018-02-19 MED ORDER — SODIUM CHLORIDE 0.9 % IV SOLN
INTRAVENOUS | Status: DC
  Administered 2018-02-19: 12:00:00 via INTRAVENOUS

## 2018-02-19 MED ORDER — TRANEXAMIC ACID 1000 MG/10ML IV SOLN
2000.0000 mg | INTRAVENOUS | Status: DC
  Filled 2018-02-19: qty 20

## 2018-02-19 MED ORDER — PHENOL 1.4 % MT LIQD: 1.0000 | OROMUCOSAL | Status: DC | PRN

## 2018-02-19 MED ORDER — DEXAMETHASONE SODIUM PHOSPHATE 10 MG/ML IJ SOLN
INTRAMUSCULAR | Status: AC
  Filled 2018-02-19: qty 1

## 2018-02-19 MED ORDER — PROPOFOL 10 MG/ML IV BOLUS
INTRAVENOUS | Status: DC | PRN
  Administered 2018-02-19: 180 mg via INTRAVENOUS
  Administered 2018-02-19 (×2): 10 mg via INTRAVENOUS
  Administered 2018-02-19: 40 mg via INTRAVENOUS

## 2018-02-19 MED ORDER — ACETAMINOPHEN 10 MG/ML IV SOLN
1000.0000 mg | Freq: Four times a day (QID) | INTRAVENOUS | Status: DC
  Administered 2018-02-19: 1000 mg via INTRAVENOUS
  Filled 2018-02-19: qty 100

## 2018-02-19 MED ORDER — ACETAMINOPHEN 500 MG PO TABS
1000.0000 mg | ORAL_TABLET | Freq: Four times a day (QID) | ORAL | Status: AC
  Administered 2018-02-19 – 2018-02-20 (×4): 1000 mg via ORAL
  Filled 2018-02-19 (×4): qty 2

## 2018-02-19 MED ORDER — METOCLOPRAMIDE HCL 5 MG/ML IJ SOLN: 5.0000 mg | Freq: Three times a day (TID) | INTRAMUSCULAR | Status: DC | PRN

## 2018-02-19 MED ORDER — FENTANYL CITRATE (PF) 100 MCG/2ML IJ SOLN
INTRAMUSCULAR | Status: AC
  Filled 2018-02-19: qty 2

## 2018-02-19 MED ORDER — FENTANYL CITRATE (PF) 100 MCG/2ML IJ SOLN
50.0000 ug | INTRAMUSCULAR | Status: DC
  Administered 2018-02-19: 100 ug via INTRAVENOUS
  Filled 2018-02-19: qty 2

## 2018-02-19 MED ORDER — SODIUM CHLORIDE (PF) 0.9 % IJ SOLN
INTRAMUSCULAR | Status: AC
  Filled 2018-02-19: qty 50

## 2018-02-19 MED ORDER — ALBUTEROL SULFATE (2.5 MG/3ML) 0.083% IN NEBU: 2.5000 mg | INHALATION_SOLUTION | Freq: Four times a day (QID) | RESPIRATORY_TRACT | Status: DC | PRN

## 2018-02-19 MED ORDER — CEFAZOLIN SODIUM-DEXTROSE 2-4 GM/100ML-% IV SOLN
2.0000 g | Freq: Four times a day (QID) | INTRAVENOUS | Status: AC
  Administered 2018-02-19 (×2): 2 g via INTRAVENOUS
  Filled 2018-02-19 (×2): qty 100

## 2018-02-19 MED ORDER — DEXAMETHASONE SODIUM PHOSPHATE 10 MG/ML IJ SOLN
INTRAMUSCULAR | Status: DC | PRN
  Administered 2018-02-19: 8 mg via INTRAVENOUS

## 2018-02-19 MED ORDER — OXYCODONE HCL 5 MG PO TABS
5.0000 mg | ORAL_TABLET | ORAL | Status: DC | PRN
  Administered 2018-02-19 – 2018-02-20 (×7): 10 mg via ORAL
  Filled 2018-02-19 (×7): qty 2

## 2018-02-19 MED ORDER — TRAMADOL HCL 50 MG PO TABS
50.0000 mg | ORAL_TABLET | Freq: Four times a day (QID) | ORAL | Status: DC | PRN
  Administered 2018-02-20: 100 mg via ORAL
  Filled 2018-02-19: qty 2

## 2018-02-19 MED ORDER — ONDANSETRON HCL 4 MG/2ML IJ SOLN
INTRAMUSCULAR | Status: DC | PRN
  Administered 2018-02-19: 4 mg via INTRAVENOUS

## 2018-02-19 MED ORDER — BISACODYL 10 MG RE SUPP: 10.0000 mg | Freq: Every day | RECTAL | Status: DC | PRN

## 2018-02-19 MED ORDER — SODIUM CHLORIDE 0.9 % IR SOLN
Status: DC | PRN
  Administered 2018-02-19: 1000 mL

## 2018-02-19 MED ORDER — PROMETHAZINE HCL 25 MG/ML IJ SOLN: 6.2500 mg | INTRAMUSCULAR | Status: DC | PRN

## 2018-02-19 MED ORDER — ALBUTEROL SULFATE HFA 108 (90 BASE) MCG/ACT IN AERS: 2.0000 | INHALATION_SPRAY | Freq: Four times a day (QID) | RESPIRATORY_TRACT | Status: DC | PRN

## 2018-02-19 MED ORDER — EPHEDRINE SULFATE-NACL 50-0.9 MG/10ML-% IV SOSY
PREFILLED_SYRINGE | INTRAVENOUS | Status: DC | PRN
  Administered 2018-02-19 (×2): 5 mg via INTRAVENOUS

## 2018-02-19 MED ORDER — METHOCARBAMOL 500 MG IVPB - SIMPLE MED
INTRAVENOUS | Status: AC
  Filled 2018-02-19: qty 50

## 2018-02-19 MED ORDER — MIDAZOLAM HCL 2 MG/2ML IJ SOLN: 2.0000 mg | INTRAMUSCULAR | Status: DC

## 2018-02-19 MED ORDER — TRANEXAMIC ACID-NACL 1000-0.7 MG/100ML-% IV SOLN
1000.0000 mg | INTRAVENOUS | Status: AC
  Administered 2018-02-19: 1000 mg via INTRAVENOUS
  Filled 2018-02-19: qty 100

## 2018-02-19 MED ORDER — BUPIVACAINE LIPOSOME 1.3 % IJ SUSP
INTRAMUSCULAR | Status: DC | PRN
  Administered 2018-02-19: 20 mL

## 2018-02-19 MED ORDER — SODIUM CHLORIDE (PF) 0.9 % IJ SOLN
INTRAMUSCULAR | Status: AC
  Filled 2018-02-19: qty 10

## 2018-02-19 MED ORDER — CEFAZOLIN SODIUM-DEXTROSE 2-4 GM/100ML-% IV SOLN
2.0000 g | INTRAVENOUS | Status: AC
  Administered 2018-02-19: 2 g via INTRAVENOUS
  Filled 2018-02-19: qty 100

## 2018-02-19 MED ORDER — ONDANSETRON HCL 4 MG PO TABS: 4.0000 mg | ORAL_TABLET | Freq: Four times a day (QID) | ORAL | Status: DC | PRN

## 2018-02-19 MED ORDER — METHOCARBAMOL 500 MG PO TABS
500.0000 mg | ORAL_TABLET | Freq: Four times a day (QID) | ORAL | Status: DC | PRN
  Administered 2018-02-19 – 2018-02-20 (×3): 500 mg via ORAL
  Filled 2018-02-19 (×3): qty 1

## 2018-02-19 MED ORDER — METOCLOPRAMIDE HCL 5 MG PO TABS: 5.0000 mg | ORAL_TABLET | Freq: Three times a day (TID) | ORAL | Status: DC | PRN

## 2018-02-19 MED ORDER — MENTHOL 3 MG MT LOZG: 1.0000 | LOZENGE | OROMUCOSAL | Status: DC | PRN

## 2018-02-19 MED ORDER — DEXAMETHASONE SODIUM PHOSPHATE 10 MG/ML IJ SOLN: 8.0000 mg | Freq: Once | INTRAMUSCULAR | Status: DC

## 2018-02-19 MED ORDER — MONTELUKAST SODIUM 10 MG PO TABS
10.0000 mg | ORAL_TABLET | Freq: Every day | ORAL | Status: DC
  Administered 2018-02-19: 10 mg via ORAL
  Filled 2018-02-19: qty 1

## 2018-02-19 MED ORDER — MIDAZOLAM HCL 2 MG/2ML IJ SOLN
1.0000 mg | INTRAMUSCULAR | Status: DC
  Administered 2018-02-19: 2 mg via INTRAVENOUS
  Filled 2018-02-19: qty 2

## 2018-02-19 MED ORDER — GABAPENTIN 300 MG PO CAPS
300.0000 mg | ORAL_CAPSULE | Freq: Three times a day (TID) | ORAL | Status: DC
  Administered 2018-02-19 – 2018-02-20 (×3): 300 mg via ORAL
  Filled 2018-02-19 (×3): qty 1

## 2018-02-19 MED ORDER — PROPOFOL 500 MG/50ML IV EMUL
INTRAVENOUS | Status: DC | PRN
  Administered 2018-02-19: 135 ug/kg/min via INTRAVENOUS

## 2018-02-19 MED ORDER — RIVAROXABAN 10 MG PO TABS
10.0000 mg | ORAL_TABLET | Freq: Every day | ORAL | Status: DC
  Administered 2018-02-20: 10 mg via ORAL
  Filled 2018-02-19: qty 1

## 2018-02-19 MED ORDER — METHOCARBAMOL 500 MG IVPB - SIMPLE MED
500.0000 mg | Freq: Four times a day (QID) | INTRAVENOUS | Status: DC | PRN
  Administered 2018-02-19: 500 mg via INTRAVENOUS
  Filled 2018-02-19: qty 50

## 2018-02-19 MED ORDER — LACTATED RINGERS IV SOLN
INTRAVENOUS | Status: DC
  Administered 2018-02-19: 07:00:00 via INTRAVENOUS

## 2018-02-19 MED ORDER — POLYETHYLENE GLYCOL 3350 17 G PO PACK: 17.0000 g | PACK | Freq: Every day | ORAL | Status: DC | PRN

## 2018-02-19 MED ORDER — LUBIPROSTONE 24 MCG PO CAPS
24.0000 ug | ORAL_CAPSULE | Freq: Two times a day (BID) | ORAL | Status: DC
  Administered 2018-02-19 – 2018-02-20 (×2): 24 ug via ORAL
  Filled 2018-02-19 (×3): qty 1

## 2018-02-19 MED ORDER — PANTOPRAZOLE SODIUM 40 MG PO TBEC
40.0000 mg | DELAYED_RELEASE_TABLET | Freq: Every day | ORAL | Status: DC
  Administered 2018-02-20: 40 mg via ORAL
  Filled 2018-02-19: qty 1

## 2018-02-19 MED ORDER — DIPHENHYDRAMINE HCL 12.5 MG/5ML PO ELIX: 12.5000 mg | ORAL_SOLUTION | ORAL | Status: DC | PRN

## 2018-02-19 MED ORDER — FENTANYL CITRATE (PF) 100 MCG/2ML IJ SOLN: 100.0000 ug | INTRAMUSCULAR | Status: DC

## 2018-02-19 MED ORDER — MORPHINE SULFATE (PF) 4 MG/ML IV SOLN
1.0000 mg | INTRAVENOUS | Status: DC | PRN
  Administered 2018-02-19: 1 mg via INTRAVENOUS
  Administered 2018-02-19: 2 mg via INTRAVENOUS
  Filled 2018-02-19 (×2): qty 1

## 2018-02-19 MED ORDER — SODIUM CHLORIDE (PF) 0.9 % IJ SOLN
INTRAMUSCULAR | Status: DC | PRN
  Administered 2018-02-19: 60 mL

## 2018-02-19 MED ORDER — DOCUSATE SODIUM 100 MG PO CAPS
100.0000 mg | ORAL_CAPSULE | Freq: Two times a day (BID) | ORAL | Status: DC
  Administered 2018-02-19 – 2018-02-20 (×2): 100 mg via ORAL
  Filled 2018-02-19 (×2): qty 1

## 2018-02-19 MED ORDER — FLEET ENEMA 7-19 GM/118ML RE ENEM: 1.0000 | ENEMA | Freq: Once | RECTAL | Status: DC | PRN

## 2018-02-19 MED ORDER — FENTANYL CITRATE (PF) 250 MCG/5ML IJ SOLN
INTRAMUSCULAR | Status: DC | PRN
  Administered 2018-02-19 (×2): 50 ug via INTRAVENOUS

## 2018-02-19 MED ORDER — FLUTICASONE PROPIONATE 50 MCG/ACT NA SUSP
2.0000 | Freq: Two times a day (BID) | NASAL | Status: DC
  Administered 2018-02-19 – 2018-02-20 (×2): 2 via NASAL
  Filled 2018-02-19: qty 16

## 2018-02-19 MED ORDER — STERILE WATER FOR IRRIGATION IR SOLN
Status: DC | PRN
  Administered 2018-02-19: 2000 mL

## 2018-02-19 MED ORDER — ACETAMINOPHEN 500 MG PO TABS: 1000.0000 mg | ORAL_TABLET | Freq: Once | ORAL | Status: DC

## 2018-02-19 MED ORDER — ONDANSETRON HCL 4 MG/2ML IJ SOLN
INTRAMUSCULAR | Status: AC
  Filled 2018-02-19: qty 2

## 2018-02-19 MED ORDER — BUPIVACAINE IN DEXTROSE 0.75-8.25 % IT SOLN
INTRATHECAL | Status: DC | PRN
  Administered 2018-02-19: 1.6 mL via INTRATHECAL

## 2018-02-19 MED ORDER — PROPOFOL 10 MG/ML IV BOLUS
INTRAVENOUS | Status: AC
  Filled 2018-02-19: qty 80

## 2018-02-19 MED ORDER — ONDANSETRON HCL 4 MG/2ML IJ SOLN: 4.0000 mg | Freq: Four times a day (QID) | INTRAMUSCULAR | Status: DC | PRN

## 2018-02-19 MED ORDER — CHLORHEXIDINE GLUCONATE 4 % EX LIQD: 60.0000 mL | Freq: Once | CUTANEOUS | Status: DC

## 2018-02-19 MED ORDER — DEXAMETHASONE SODIUM PHOSPHATE 10 MG/ML IJ SOLN
10.0000 mg | Freq: Once | INTRAMUSCULAR | Status: AC
  Administered 2018-02-20: 10 mg via INTRAVENOUS
  Filled 2018-02-19: qty 1

## 2018-02-19 SURGICAL SUPPLY — 62 items
ATTUNE MED DOME PAT 32 KNEE (Knees) ×1 IMPLANT
ATTUNE PS FEM LT SZ 7 CEM KNEE (Femur) ×1 IMPLANT
ATTUNE PSRP INSR SZ7 8 KNEE (Insert) ×1 IMPLANT
BAG SPEC THK2 15X12 ZIP CLS (MISCELLANEOUS) ×1
BAG ZIPLOCK 12X15 (MISCELLANEOUS) ×2 IMPLANT
BANDAGE ACE 6X5 VEL STRL LF (GAUZE/BANDAGES/DRESSINGS) ×2 IMPLANT
BASE TIBIA ATTUNE KNEE SYS SZ6 (Knees) IMPLANT
BLADE SAG 18X100X1.27 (BLADE) ×2 IMPLANT
BLADE SAW SGTL 11.0X1.19X90.0M (BLADE) ×2 IMPLANT
BLADE SURG SZ10 CARB STEEL (BLADE) ×4 IMPLANT
BOWL SMART MIX CTS (DISPOSABLE) ×2 IMPLANT
BSPLAT TIB 6 CMNT ROT PLAT STR (Knees) ×1 IMPLANT
CEMENT HV SMART SET (Cement) ×4 IMPLANT
COVER SURGICAL LIGHT HANDLE (MISCELLANEOUS) ×2 IMPLANT
COVER WAND RF STERILE (DRAPES) ×1 IMPLANT
CUFF TOURN SGL QUICK 34 (TOURNIQUET CUFF) ×2
CUFF TRNQT CYL 34X4.125X (TOURNIQUET CUFF) ×1 IMPLANT
DECANTER SPIKE VIAL GLASS SM (MISCELLANEOUS) ×2 IMPLANT
DRAPE U-SHAPE 47X51 STRL (DRAPES) ×2 IMPLANT
DRSG ADAPTIC 3X8 NADH LF (GAUZE/BANDAGES/DRESSINGS) ×2 IMPLANT
DRSG PAD ABDOMINAL 8X10 ST (GAUZE/BANDAGES/DRESSINGS) ×2 IMPLANT
DURAPREP 26ML APPLICATOR (WOUND CARE) ×2 IMPLANT
ELECT REM PT RETURN 15FT ADLT (MISCELLANEOUS) ×2 IMPLANT
EVACUATOR 1/8 PVC DRAIN (DRAIN) ×2 IMPLANT
GAUZE SPONGE 4X4 12PLY STRL (GAUZE/BANDAGES/DRESSINGS) ×2 IMPLANT
GLOVE BIO SURGEON STRL SZ7 (GLOVE) ×1 IMPLANT
GLOVE BIO SURGEON STRL SZ8 (GLOVE) ×2 IMPLANT
GLOVE BIOGEL PI IND STRL 6.5 (GLOVE) ×1 IMPLANT
GLOVE BIOGEL PI IND STRL 7.0 (GLOVE) ×1 IMPLANT
GLOVE BIOGEL PI IND STRL 7.5 (GLOVE) IMPLANT
GLOVE BIOGEL PI IND STRL 8 (GLOVE) ×1 IMPLANT
GLOVE BIOGEL PI INDICATOR 6.5 (GLOVE) ×1
GLOVE BIOGEL PI INDICATOR 7.0 (GLOVE)
GLOVE BIOGEL PI INDICATOR 7.5 (GLOVE) ×3
GLOVE BIOGEL PI INDICATOR 8 (GLOVE) ×1
GLOVE SURG SS PI 6.5 STRL IVOR (GLOVE) ×2 IMPLANT
GOWN SPEC L4 XLG W/TWL (GOWN DISPOSABLE) ×1 IMPLANT
GOWN STRL REUS W/TWL LRG LVL3 (GOWN DISPOSABLE) ×5 IMPLANT
HANDPIECE INTERPULSE COAX TIP (DISPOSABLE) ×2
HOLDER FOLEY CATH W/STRAP (MISCELLANEOUS) ×1 IMPLANT
IMMOBILIZER KNEE 20 (SOFTGOODS) ×2
IMMOBILIZER KNEE 20 THIGH 36 (SOFTGOODS) IMPLANT
MANIFOLD NEPTUNE II (INSTRUMENTS) ×2 IMPLANT
NS IRRIG 1000ML POUR BTL (IV SOLUTION) ×2 IMPLANT
PACK TOTAL KNEE CUSTOM (KITS) ×2 IMPLANT
PAD ABD 8X10 STRL (GAUZE/BANDAGES/DRESSINGS) ×1 IMPLANT
PADDING CAST COTTON 6X4 STRL (CAST SUPPLIES) ×5 IMPLANT
PIN THREADED HEADED SIGMA (PIN) ×1 IMPLANT
PROTECTOR NERVE ULNAR (MISCELLANEOUS) ×2 IMPLANT
SET HNDPC FAN SPRY TIP SCT (DISPOSABLE) ×1 IMPLANT
STRIP CLOSURE SKIN 1/2X4 (GAUZE/BANDAGES/DRESSINGS) ×3 IMPLANT
SUT MNCRL AB 4-0 PS2 18 (SUTURE) ×2 IMPLANT
SUT STRATAFIX 0 PDS 27 VIOLET (SUTURE) ×2
SUT VIC AB 2-0 CT1 27 (SUTURE) ×6
SUT VIC AB 2-0 CT1 TAPERPNT 27 (SUTURE) ×3 IMPLANT
SUTURE STRATFX 0 PDS 27 VIOLET (SUTURE) ×1 IMPLANT
TIBIA ATTUNE KNEE SYS BASE SZ6 (Knees) ×2 IMPLANT
TRAY FOLEY CATH 14FRSI W/METER (CATHETERS) ×1 IMPLANT
TRAY FOLEY MTR SLVR 16FR STAT (SET/KITS/TRAYS/PACK) ×1 IMPLANT
WATER STERILE IRR 1000ML POUR (IV SOLUTION) ×4 IMPLANT
WRAP KNEE MAXI GEL POST OP (GAUZE/BANDAGES/DRESSINGS) ×2 IMPLANT
YANKAUER SUCT BULB TIP 10FT TU (MISCELLANEOUS) ×2 IMPLANT

## 2018-02-19 NOTE — Transfer of Care (Signed)
Immediate Anesthesia Transfer of Care Note  Patient: Mandy Hanson  Procedure(s) Performed: TOTAL KNEE ARTHROPLASTY (Left )  Patient Location: PACU  Anesthesia Type:General  Level of Consciousness: awake, alert  and oriented  Airway & Oxygen Therapy: Patient Spontanous Breathing and Patient connected to face mask oxygen  Post-op Assessment: Report given to RN and Post -op Vital signs reviewed and stable  Post vital signs: Reviewed and stable  Last Vitals:  Vitals Value Taken Time  BP 115/64 02/19/2018  9:31 AM  Temp    Pulse 85 02/19/2018  9:32 AM  Resp 16 02/19/2018  9:32 AM  SpO2 100 % 02/19/2018  9:32 AM  Vitals shown include unvalidated device data.  Last Pain:  Vitals:   02/19/18 0757  TempSrc:   PainSc: 0-No pain         Complications: No apparent anesthesia complications

## 2018-02-19 NOTE — Anesthesia Postprocedure Evaluation (Signed)
Anesthesia Post Note  Patient: Mandy Hanson  Procedure(s) Performed: TOTAL KNEE ARTHROPLASTY (Left )     Patient location during evaluation: PACU Anesthesia Type: Spinal and General Level of consciousness: sedated Pain management: pain level controlled Vital Signs Assessment: post-procedure vital signs reviewed and stable Respiratory status: spontaneous breathing and respiratory function stable Cardiovascular status: stable Postop Assessment: no apparent nausea or vomiting Anesthetic complications: no    Last Vitals:  Vitals:   02/19/18 1030 02/19/18 1045  BP: 114/69 120/63  Pulse: 70 79  Resp: 20 19  Temp:  36.4 C  SpO2: 100% 100%    Last Pain:  Vitals:   02/19/18 1045  TempSrc:   PainSc: 0-No pain    LLE Motor Response: Purposeful movement (02/19/18 1045) LLE Sensation: Numbness (02/19/18 1045) RLE Motor Response: Purposeful movement (02/19/18 1045) RLE Sensation: Full sensation (02/19/18 1045) L Sensory Level: S1-Sole of foot, small toes (02/19/18 1045) R Sensory Level: S1-Sole of foot, small toes (02/19/18 1045)  Gregor Dershem Rumberger

## 2018-02-19 NOTE — Interval H&P Note (Signed)
History and Physical Interval Note:  02/19/2018 6:38 AM  Mandy Hanson  has presented today for surgery, with the diagnosis of left knee osteoarthritis  The various methods of treatment have been discussed with the patient and family. After consideration of risks, benefits and other options for treatment, the patient has consented to  Procedure(s) with comments: TOTAL KNEE ARTHROPLASTY (Left) - as a surgical intervention .  The patient's history has been reviewed, patient examined, no change in status, stable for surgery.  I have reviewed the patient's chart and labs.  Questions were answered to the patient's satisfaction.     Homero Fellers [name]

## 2018-02-19 NOTE — Anesthesia Procedure Notes (Signed)
Procedure Name: LMA Insertion Date/Time: 02/19/2018 8:37 AM Performed by: Nelle Don, CRNA Pre-anesthesia Checklist: Patient identified, Emergency Drugs available, Suction available and Patient being monitored Patient Re-evaluated:Patient Re-evaluated prior to induction Oxygen Delivery Method: Circle system utilized Preoxygenation: Pre-oxygenation with 100% oxygen Induction Type: IV induction LMA: LMA inserted LMA Size: 4.0 Number of attempts: 1 Dental Injury: Teeth and Oropharynx as per pre-operative assessment

## 2018-02-19 NOTE — Anesthesia Procedure Notes (Signed)
Spinal  Patient location during procedure: OR Start time: 02/19/2018 8:06 AM End time: 02/19/2018 8:14 AM Staffing Anesthesiologist: Heather Roberts, MD Performed: anesthesiologist  Preanesthetic Checklist Completed: patient identified, surgical consent, pre-op evaluation, timeout performed, IV checked, risks and benefits discussed and monitors and equipment checked Spinal Block Patient position: sitting Prep: DuraPrep Patient monitoring: cardiac monitor, continuous pulse ox and blood pressure Approach: midline Location: L2-3 Injection technique: single-shot Needle Needle type: Pencan  Needle gauge: 24 G Needle length: 9 cm Additional Notes Functioning IV was confirmed and monitors were applied. Sterile prep and drape, including hand hygiene and sterile gloves were used. The patient was positioned and the spine was prepped. The skin was anesthetized with lidocaine.  Free flow of clear CSF was obtained prior to injecting local anesthetic into the CSF.  The spinal needle aspirated freely following injection.  The needle was carefully withdrawn.  The patient tolerated the procedure well.

## 2018-02-19 NOTE — Discharge Instructions (Addendum)
° °Dr. Frank Aluisio °Total Joint Specialist °[facility] °[address] °[place], [place] [address] °[phone] ° °TOTAL KNEE REPLACEMENT POSTOPERATIVE DIRECTIONS ° °Knee Rehabilitation, Guidelines Following Surgery  °Results after knee surgery are often greatly improved when you follow the exercise, range of motion and muscle strengthening exercises prescribed by your doctor. Safety measures are also important to protect the knee from further injury. Any time any of these exercises cause you to have increased pain or swelling in your knee joint, decrease the amount until you are comfortable again and slowly increase them. If you have problems or questions, call your caregiver or physical therapist for advice.  ° °HOME CARE INSTRUCTIONS  °Remove items at home which could result in a fall. This includes throw rugs or furniture in walking pathways.  °· ICE to the affected knee every three hours for 30 minutes at a time and then as needed for pain and swelling.  Continue to use ice on the knee for pain and swelling from surgery. You may notice swelling that will progress down to the foot and ankle.  This is normal after surgery.  Elevate the leg when you are not up walking on it.   °· Continue to use the breathing machine which will help keep your temperature down.  It is common for your temperature to cycle up and down following surgery, especially at night when you are not up moving around and exerting yourself.  The breathing machine keeps your lungs expanded and your temperature down. °· Do not place pillow under knee, focus on keeping the knee straight while resting ° °DIET °You may resume your previous home diet once your are discharged from the hospital. ° °DRESSING / WOUND CARE / SHOWERING °You may shower 3 days after surgery, but keep the wounds dry during showering.  You may use an occlusive plastic wrap (Press'n Seal for example), NO SOAKING/SUBMERGING IN THE BATHTUB.  If the bandage gets  wet, change with a clean dry gauze.  If the incision gets wet, pat the wound dry with a clean towel. °You may start showering once you are discharged home but do not submerge the incision under water. Just pat the incision dry and apply a dry gauze dressing on daily. °Change the surgical dressing daily and reapply a dry dressing each time. ° °ACTIVITY °Walk with your walker as instructed. °Use walker as long as suggested by your caregivers. °Avoid periods of inactivity such as sitting longer than an hour when not asleep. This helps prevent blood clots.  °You may resume a sexual relationship in one month or when given the OK by your doctor.  °You may return to work once you are cleared by your doctor.  °Do not drive a car for 6 weeks or until released by you surgeon.  °Do not drive while taking narcotics. ° °WEIGHT BEARING °Weight bearing as tolerated with assist device (walker, cane, etc) as directed, use it as long as suggested by your surgeon or therapist, typically at least 4-6 weeks. ° °POSTOPERATIVE CONSTIPATION PROTOCOL °Constipation - defined medically as fewer than three stools per week and severe constipation as less than one stool per week. ° °One of the most common issues patients have following surgery is constipation.  Even if you have a regular bowel pattern at home, your normal regimen is likely to be disrupted due to multiple reasons following surgery.  Combination of anesthesia, postoperative narcotics, change in appetite and fluid intake all can affect your bowels.    In order to avoid complications following surgery, here are some recommendations in order to help you during your recovery period. ° °Colace (docusate) - Pick up an over-the-counter form of Colace or another stool softener and take twice a day as long as you are requiring postoperative pain medications.  Take with a full glass of water daily.  If you experience loose stools or diarrhea, hold the colace until you stool forms back up.  If  your symptoms do not get better within 1 week or if they get worse, check with your doctor. ° °Dulcolax (bisacodyl) - Pick up over-the-counter and take as directed by the product packaging as needed to assist with the movement of your bowels.  Take with a full glass of water.  Use this product as needed if not relieved by Colace only.  ° °MiraLax (polyethylene glycol) - Pick up over-the-counter to have on hand.  MiraLax is a solution that will increase the amount of water in your bowels to assist with bowel movements.  Take as directed and can mix with a glass of water, juice, soda, coffee, or tea.  Take if you go more than two days without a movement. °Do not use MiraLax more than once per day. Call your doctor if you are still constipated or irregular after using this medication for 7 days in a row. ° °If you continue to have problems with postoperative constipation, please contact the office for further assistance and recommendations.  If you experience "the worst abdominal pain ever" or develop nausea or vomiting, please contact the office immediatly for further recommendations for treatment. ° °ITCHING ° If you experience itching with your medications, try taking only a single pain pill, or even half a pain pill at a time.  You can also use Benadryl over the counter for itching or also to help with sleep.  ° °TED HOSE STOCKINGS °Wear the elastic stockings on both legs for three weeks following surgery during the day but you may remove then at night for sleeping. ° °MEDICATIONS °See your medication summary on the “After Visit Summary” that the nursing staff will review with you prior to discharge.  You may have some home medications which will be placed on hold until you complete the course of blood thinner medication.  It is important for you to complete the blood thinner medication as prescribed by your surgeon.  Continue your approved medications as instructed at time of discharge. ° °PRECAUTIONS °If you  experience chest pain or shortness of breath - call 911 immediately for transfer to the hospital emergency department.  °If you develop a fever greater that 101 F, purulent drainage from wound, increased redness or drainage from wound, foul odor from the wound/dressing, or calf pain - CONTACT YOUR SURGEON.   °                                                °FOLLOW-UP APPOINTMENTS °Make sure you keep all of your appointments after your operation with your surgeon and caregivers. You should call the office at the above phone number and make an appointment for approximately two weeks after the date of your surgery or on the date instructed by your surgeon outlined in the "After Visit Summary". ° ° °RANGE OF MOTION AND STRENGTHENING EXERCISES  °Rehabilitation of the knee is important following a knee injury or   an operation. After just a few days of immobilization, the muscles of the thigh which control the knee become weakened and shrink (atrophy). Knee exercises are designed to build up the tone and strength of the thigh muscles and to improve knee motion. Often times heat used for twenty to thirty minutes before working out will loosen up your tissues and help with improving the range of motion but do not use heat for the first two weeks following surgery. These exercises can be done on a training (exercise) mat, on the floor, on a table or on a bed. Use what ever works the best and is most comfortable for you Knee exercises include:  °Leg Lifts - While your knee is still immobilized in a splint or cast, you can do straight leg raises. Lift the leg to 60 degrees, hold for 3 sec, and slowly lower the leg. Repeat 10-20 times 2-3 times daily. Perform this exercise against resistance later as your knee gets better.  °Quad and Hamstring Sets - Tighten up the muscle on the front of the thigh (Quad) and hold for 5-10 sec. Repeat this 10-20 times hourly. Hamstring sets are done by pushing the foot backward against an object  and holding for 5-10 sec. Repeat as with quad sets.  °· Leg Slides: Lying on your back, slowly slide your foot toward your buttocks, bending your knee up off the floor (only go as far as is comfortable). Then slowly slide your foot back down until your leg is flat on the floor again. °· Angel Wings: Lying on your back spread your legs to the side as far apart as you can without causing discomfort.  °A rehabilitation program following serious knee injuries can speed recovery and prevent re-injury in the future due to weakened muscles. Contact your doctor or a physical therapist for more information on knee rehabilitation.  ° °IF YOU ARE TRANSFERRED TO A SKILLED REHAB FACILITY °If the patient is transferred to a skilled rehab facility following release from the hospital, a list of the current medications will be sent to the facility for the patient to continue.  When discharged from the skilled rehab facility, please have the facility set up the patient's Home Health Physical Therapy prior to being released. Also, the skilled facility will be responsible for providing the patient with their medications at time of release from the facility to include their pain medication, the muscle relaxants, and their blood thinner medication. If the patient is still at the rehab facility at time of the two week follow up appointment, the skilled rehab facility will also need to assist the patient in arranging follow up appointment in our office and any transportation needs. ° °MAKE SURE YOU:  °Understand these instructions.  °Get help right away if you are not doing well or get worse.  ° ° °Pick up stool softner and laxative for home use following surgery while on pain medications. °Do not submerge incision under water. °Please use good hand washing techniques while changing dressing each day. °May shower starting three days after surgery. °Please use a clean towel to pat the incision dry following showers. °Continue to use ice for  pain and swelling after surgery. °Do not use any lotions or creams on the incision until instructed by your surgeon. ° °Information on my medicine - XARELTO® (Rivaroxaban) ° ° ° °Why was Xarelto® prescribed for you? °Xarelto® was prescribed for you to reduce the risk of blood clots forming after orthopedic surgery. The medical term for   these abnormal blood clots is venous thromboembolism (VTE). ° °What do you need to know about xarelto® ? °Take your Xarelto® ONCE DAILY at the same time every day. °You may take it either with or without food. ° °If you have difficulty swallowing the tablet whole, you may crush it and mix in applesauce just prior to taking your dose. ° °Take Xarelto® exactly as prescribed by your doctor and DO NOT stop taking Xarelto® without talking to the doctor who prescribed the medication.  Stopping without other VTE prevention medication to take the place of Xarelto® may increase your risk of developing a clot. ° °After discharge, you should have regular check-up appointments with your healthcare provider that is prescribing your Xarelto®.   ° °What do you do if you miss a dose? °If you miss a dose, take it as soon as you remember on the same day then continue your regularly scheduled once daily regimen the next day. Do not take two doses of Xarelto® on the same day.  ° °Important Safety Information °A possible side effect of Xarelto® is bleeding. You should call your healthcare provider right away if you experience any of the following: °? Bleeding from an injury or your nose that does not stop. °? Unusual colored urine (red or dark brown) or unusual colored stools (red or black). °? Unusual bruising for unknown reasons. °? A serious fall or if you hit your head (even if there is no bleeding). ° °Some medicines may interact with Xarelto® and might increase your risk of bleeding while on Xarelto®. To help avoid this, consult your healthcare provider or pharmacist prior to using any new  prescription or non-prescription medications, including herbals, vitamins, non-steroidal anti-inflammatory drugs (NSAIDs) and supplements. ° °This website has more information on Xarelto®: www.xarelto.com. ° ° °

## 2018-02-19 NOTE — Evaluation (Signed)
Physical Therapy Evaluation Patient Details Name: Mandy Hanson MRN: [id number] DOB: 1950-12-19 Today's Date: 02/19/2018   History of Present Illness  68 yo female s/p L TKR on 02/19/18. PMH includes contact dermatitis, OA, diverticulitis, GERD, recurrent URI.   Clinical Impression  Pt presents with L knee pain, decreased L knee ROM, difficulty performing bed mobility, increased time and effort to perform transfers/ambulation, and decreased tolerance for ambulation due to L knee pain. Pt to benefit from acute PT to address deficits. Pt ambulated 40 ft with RW with min guard assist, verbal cuing provided for form and safety throughout. Pt educated on ankle pumps (20/hour) to perform this afternoon/evening to increase circulation, to pt's tolerance and limited by pain. PT to progress mobility as tolerated, and will continue to follow acutely.       Follow Up Recommendations Follow surgeon's recommendation for DC plan and follow-up therapies;Supervision for mobility/OOB    Equipment Recommendations  Rolling walker with 5" wheels;3in1 (PT)    Recommendations for Other Services       Precautions / Restrictions Precautions Precautions: Fall Required Braces or Orthoses: Knee Immobilizer - Left Knee Immobilizer - Left: On when out of bed or walking;Discontinue once straight leg raise with < 10 degree lag Restrictions Weight Bearing Restrictions: No Other Position/Activity Restrictions: WBAT       Mobility  Bed Mobility Overal bed mobility: Needs Assistance Bed Mobility: Supine to Sit     Supine to sit: Min assist;HOB elevated     General bed mobility comments: Min assist for LLE lifting and translation to EOB. Verbal cuing for sequencing, increased time and effort.   Transfers Overall transfer level: Needs assistance Equipment used: Rolling walker (2 wheeled) Transfers: Sit to/from Stand Sit to Stand: Min guard;From elevated surface         General transfer comment: Min  guard for safety. Verbal cuing for hand placement when rising.   Ambulation/Gait Ambulation/Gait assistance: Min guard;+2 safety/equipment(chair follow ) Gait Distance (Feet): 40 Feet Assistive device: Rolling walker (2 wheeled) Gait Pattern/deviations: Step-to pattern;Decreased stance time - left;Decreased weight shift to left;Antalgic;Trunk flexed Gait velocity: decr    General Gait Details: Min guard for safety. Verbal cuing for upright posture, sequencing with step-to gait, turning with RW. In last 10 ft of ambulation, pt reporting feeling clammy, hot, and queasy. Pt sat in recliner, PT reclined pt, and cool cloths applied to forehead and back of neck. Pt with resolved symptoms after ~2 minutes.  Stairs            Wheelchair Mobility    Modified Rankin (Stroke Patients Only)       Balance Overall balance assessment: Mild deficits observed, not formally tested                                           Pertinent Vitals/Pain Pain Assessment: 0-10 Pain Score: 6  Pain Location: L knee, with mobility  Pain Descriptors / Indicators: Sore Pain Intervention(s): Limited activity within patient's tolerance;Repositioned;Ice applied;Monitored during session;Premedicated before session;RN gave pain meds during session    Home Living Family/patient expects to be discharged to:: Private residence Living Arrangements: Spouse/significant other Available Help at Discharge: Family;Available 24 hours/day Type of Home: House Home Access: Stairs to enter Entrance Stairs-Rails: None Entrance Stairs-Number of Steps: 2 Home Layout: Two level;Able to live on main level with bedroom/bathroom Home Equipment: Gilmer Mor - single point;Tub bench;Shower  seat - built in      Prior Function Level of Independence: Independent               Hand Dominance   Dominant Hand: Right    Extremity/Trunk Assessment   Upper Extremity Assessment Upper Extremity Assessment: Overall  WFL for tasks assessed    Lower Extremity Assessment Lower Extremity Assessment: Overall WFL for tasks assessed;LLE deficits/detail LLE Deficits / Details: suspected post-surgical weakness; able to perform ankle pumps, quad set, SLR with lift assist, heel slides to 60* LLE Sensation: WNL    Cervical / Trunk Assessment Cervical / Trunk Assessment: Normal  Communication   Communication: No difficulties  Cognition Arousal/Alertness: Awake/alert Behavior During Therapy: WFL for tasks assessed/performed Overall Cognitive Status: Within Functional Limits for tasks assessed                                        General Comments      Exercises Total Joint Exercises Goniometric ROM: knee aarom ~10-60*, limited by pain    Assessment/Plan    PT Assessment Patient needs continued PT services  PT Problem List Decreased strength;Pain;Decreased range of motion;Decreased activity tolerance;Decreased knowledge of use of DME;Decreased balance;Decreased mobility       PT Treatment Interventions DME instruction;Therapeutic activities;Gait training;Therapeutic exercise;Patient/family education;Balance training;Stair training;Functional mobility training    PT Goals (Current goals can be found in the Care Plan section)  Acute Rehab PT Goals Patient Stated Goal: decrease L knee pain  PT Goal Formulation: With patient Time For Goal Achievement: 02/26/18 Potential to Achieve Goals: Good    Frequency 7X/week   Barriers to discharge        Co-evaluation               AM-PAC PT "6 Clicks" Mobility  Outcome Measure Help needed turning from your back to your side while in a flat bed without using bedrails?: A Little Help needed moving from lying on your back to sitting on the side of a flat bed without using bedrails?: A Little Help needed moving to and from a bed to a chair (including a wheelchair)?: A Little Help needed standing up from a chair using your arms (e.g.,  wheelchair or bedside chair)?: A Little Help needed to walk in hospital room?: A Little Help needed climbing 3-5 steps with a railing? : A Little 6 Click Score: 18    End of Session Equipment Utilized During Treatment: Gait belt;Left knee immobilizer Activity Tolerance: Patient tolerated treatment well;Patient limited by pain Patient left: with call bell/phone within reach;with family/visitor present;with SCD's reapplied;in chair;with chair alarm set Nurse Communication: Mobility status PT Visit Diagnosis: Other abnormalities of gait and mobility (R26.89);Difficulty in walking, not elsewhere classified (R26.2)    Time: 2229-7989 PT Time Calculation (min) (ACUTE ONLY): 31 min   Charges:   PT Evaluation $PT Eval Low Complexity: 1 Low PT Treatments $Gait Training: 8-22 mins       Nicola Police, PT Acute Rehabilitation Services Pager [phone]  Office [phone]  Mandy Hanson Mandy Hanson 02/19/2018, 3:57 PM

## 2018-02-19 NOTE — Plan of Care (Signed)
  Problem: Education: Goal: Knowledge of the prescribed therapeutic regimen will improve Outcome: Progressing   Problem: Pain Management: Goal: Pain level will decrease with appropriate interventions Outcome: Progressing   Problem: Clinical Measurements: Goal: Will remain free from infection Outcome: Progressing Goal: Respiratory complications will improve Outcome: Progressing   Problem: Safety: Goal: Ability to remain free from injury will improve Outcome: Progressing   

## 2018-02-19 NOTE — Progress Notes (Signed)
Assisted Dr. Singer with left, ultrasound guided, adductor canal block. Side rails up, monitors on throughout procedure. See vital signs in flow sheet. Tolerated Procedure well.  

## 2018-02-19 NOTE — Anesthesia Procedure Notes (Signed)
Anesthesia Regional Block: Adductor canal block   Pre-Anesthetic Checklist: ,, timeout performed, Correct Patient, Correct Site, Correct Laterality, Correct Procedure, Correct Position, site marked, Risks and benefits discussed,  Surgical consent,  Pre-op evaluation,  At surgeon's request and post-op pain management  Laterality: Left  Prep: chloraprep       Needles:  Injection technique: Single-shot  Needle Type: Stimulator Needle - 80     Needle Length: 10cm  Needle Gauge: 21     Additional Needles:   Narrative:  Start time: 02/19/2018 7:39 AM End time: 02/19/2018 7:49 AM Injection made incrementally with aspirations every 5 mL.  Performed by: Personally

## 2018-02-19 NOTE — Anesthesia Procedure Notes (Signed)
Procedure Name: MAC Date/Time: 02/19/2018 8:09 AM Performed by: Niel Hummer, CRNA Pre-anesthesia Checklist: Patient identified, Emergency Drugs available, Suction available and Patient being monitored Patient Re-evaluated:Patient Re-evaluated prior to induction Oxygen Delivery Method: Simple face mask

## 2018-02-19 NOTE — Op Note (Signed)
OPERATIVE REPORT-TOTAL KNEE ARTHROPLASTY   Pre-operative diagnosis- Osteoarthritis  Left knee(s)  Post-operative diagnosis- Osteoarthritis Left knee(s)  Procedure-  Left  Total Knee Arthroplasty  Surgeon- Gus Rankin. Shaheen Mende, MD  Assistant- Dimitri Ped, PA-C   Anesthesia-  General and adductor canal block  EBL-20 mL   Drains Hemovac  Tourniquet time- 34 minutes @ 300 mm Hg  Complications- None  Condition-PACU - hemodynamically stable.   Brief Clinical Note  Mandy LAMUNYON is a 68 y.o. 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 She has constant pain, with activity and at rest and significant functional deficits with difficulties even with ADLs. She has had extensive non-op management including analgesics, injections of cortisone and viscosupplements, and home exercise program, but remains in significant pain with significant dysfunction. Radiographs show bone on bone arthritis patellofemoral. She presents now for left Total Knee Arthroplasty.    Procedure in detail---   The patient is brought into the operating room and positioned supine on the operating table. After successful administration of  GA combined with regional for post-op pain,   a tourniquet is placed high on the  Left thigh(s) and the lower extremity is prepped and draped in the usual sterile fashion. Time out is performed by the operating team and then the  Left lower extremity is wrapped in Esmarch, knee flexed and the tourniquet inflated to 300 mmHg.       A midline incision is made with a ten blade through the subcutaneous tissue to the level of the extensor mechanism. A fresh blade is used to make a medial parapatellar arthrotomy. Soft tissue over the proximal medial tibia is subperiosteally elevated to the joint line with a knife and into the semimembranosus bursa with a Cobb elevator. Soft tissue over the proximal lateral tibia is elevated with  attention being paid to avoiding the patellar tendon on the tibial tubercle. The patella is everted, knee flexed 90 degrees and the ACL and PCL are removed. Findings are bone on bone patellofemoral with significant patellar and trochlear erosion.        The drill is used to create a starting hole in the distal femur and the canal is thoroughly irrigated with sterile saline to remove the fatty contents. The 5 degree Left  valgus alignment guide is placed into the femoral canal and the distal femoral cutting block is pinned to remove 9 mm off the distal femur. Resection is made with an oscillating saw.      The tibia is subluxed forward and the menisci are removed. The extramedullary alignment guide is placed referencing proximally at the medial aspect of the tibial tubercle and distally along the second metatarsal axis and tibial crest. The block is pinned to remove 70mm off the more deficient lateral  side. Resection is made with an oscillating saw. Size 6is the most appropriate size for the tibia and the proximal tibia is prepared with the modular drill and keel punch for that size.      The femoral sizing guide is placed and size 7 is most appropriate. Rotation is marked off the epicondylar axis and confirmed by creating a rectangular flexion gap at 90 degrees. The size 7 cutting block is pinned in this rotation and the anterior, posterior and chamfer cuts are made with the oscillating saw. The intercondylar block is then placed and that cut is made.      Trial size 6 tibial component, trial size 7 posterior  stabilized femur and a 8  mm posterior stabilized rotating platform insert trial is placed. Full extension is achieved with excellent varus/valgus and anterior/posterior balance throughout full range of motion. The patella is everted and thickness measured to be 21  mm. Free hand resection is taken to 12 mm, a 32 template is placed, lug holes are drilled, trial patella is placed, and it tracks normally.  Osteophytes are removed off the posterior femur with the trial in place. All trials are removed and the cut bone surfaces prepared with pulsatile lavage. Cement is mixed and once ready for implantation, the size 6 tibial implant, size  7 posterior stabilized femoral component, and the size 32 patella are cemented in place and the patella is held with the clamp. The trial insert is placed and the knee held in full extension. The Exparel (20 ml mixed with 60 ml saline) is injected into the extensor mechanism, posterior capsule, medial and lateral gutters and subcutaneous tissues.  All extruded cement is removed and once the cement is hard the permanent 8 mm posterior stabilized rotating platform insert is placed into the tibial tray.      The wound is copiously irrigated with saline solution and the extensor mechanism closed over a hemovac drain with #1 V-loc suture. The tourniquet is released for a total tourniquet time of 34  minutes. Flexion against gravity is 140 degrees and the patella tracks normally. Subcutaneous tissue is closed with 2.0 vicryl and subcuticular with running 4.0 Monocryl. The incision is cleaned and dried and steri-strips and a bulky sterile dressing are applied. The limb is placed into a knee immobilizer and the patient is awakened and transported to recovery in stable condition.      Please note that a surgical assistant was a medical necessity for this procedure in order to perform it in a safe and expeditious manner. Surgical assistant was necessary to retract the ligaments and vital neurovascular structures to prevent injury to them and also necessary for proper positioning of the limb to allow for anatomic placement of the prosthesis.   Gus Rankin Keayra Graham, MD    02/19/2018, 9:11 AM

## 2018-02-20 ENCOUNTER — Encounter (HOSPITAL_COMMUNITY): Payer: Self-pay | Admitting: Orthopedic Surgery

## 2018-02-20 DIAGNOSIS — M1712 Unilateral primary osteoarthritis, left knee: Secondary | ICD-10-CM | POA: Diagnosis not present

## 2018-02-20 LAB — BASIC METABOLIC PANEL
Anion gap: 6 (ref 5–15)
BUN: 11 mg/dL (ref 8–23)
CO2: 23 mmol/L (ref 22–32)
Calcium: 8.3 mg/dL — ABNORMAL LOW (ref 8.9–10.3)
Chloride: 104 mmol/L (ref 98–111)
Creatinine, Ser: 0.74 mg/dL (ref 0.44–1.00)
GFR calc Af Amer: >60 mL/min (ref >60)
GFR calc non Af Amer: >60 mL/min (ref >60)
Glucose, Bld: 106 mg/dL — ABNORMAL HIGH (ref 70–99)
POTASSIUM: 3.7 mmol/L (ref 3.5–5.1)
Sodium: 133 mmol/L — ABNORMAL LOW (ref 135–145)

## 2018-02-20 LAB — CBC
HCT: 33.3 % — ABNORMAL LOW (ref 36.0–46.0)
Hemoglobin: 10.5 g/dL — ABNORMAL LOW (ref 12.0–15.0)
MCH: 31.6 pg (ref 26.0–34.0)
MCHC: 31.5 g/dL (ref 30.0–36.0)
MCV: 100.3 fL — ABNORMAL HIGH (ref 80.0–100.0)
NRBC: 0 % (ref 0.0–0.2)
Platelets: 265 10*3/uL (ref 150–400)
RBC: 3.32 MIL/uL — ABNORMAL LOW (ref 3.87–5.11)
RDW: 12.6 % (ref 11.5–15.5)
WBC: 7 10*3/uL (ref 4.0–10.5)

## 2018-02-20 MED ORDER — TRAMADOL HCL 50 MG PO TABS: 50.0000 mg | ORAL_TABLET | Freq: Four times a day (QID) | ORAL | 0 refills | Status: DC | PRN

## 2018-02-20 MED ORDER — METHOCARBAMOL 500 MG PO TABS: 500.0000 mg | ORAL_TABLET | Freq: Four times a day (QID) | ORAL | 0 refills | Status: DC | PRN

## 2018-02-20 MED ORDER — RIVAROXABAN 10 MG PO TABS: 10.0000 mg | ORAL_TABLET | Freq: Every day | ORAL | 0 refills | Status: DC

## 2018-02-20 MED ORDER — OXYCODONE HCL 5 MG PO TABS: 5.0000 mg | ORAL_TABLET | Freq: Four times a day (QID) | ORAL | 0 refills | Status: DC | PRN

## 2018-02-20 MED ORDER — GABAPENTIN 300 MG PO CAPS: 300.0000 mg | ORAL_CAPSULE | Freq: Three times a day (TID) | ORAL | 0 refills | Status: DC

## 2018-02-20 NOTE — Progress Notes (Signed)
Physical Therapy Treatment Patient Details Name: Mandy Hanson MRN: [id number] DOB: 12-09-1950 Today's Date: 02/20/2018    History of Present Illness 68 yo female s/p L TKR on 02/19/18. PMH includes contact dermatitis, OA, diverticulitis, GERD, recurrent URI.     PT Comments    Progressing with mobility. Reviewed/practiced gait training and stair training. Will plan to have a 2nd session to review exercises and any other mobility tasks. Pt stated MD told her to try to get some time in the CPM between session-made ortho tech aware.     Follow Up Recommendations  Follow surgeon's recommendation for DC plan and follow-up therapies     Equipment Recommendations  Rolling walker with 5" wheels    Recommendations for Other Services       Precautions / Restrictions Precautions Precautions: Fall Required Braces or Orthoses: Knee Immobilizer - Left Knee Immobilizer - Left: On when out of bed or walking;Discontinue once straight leg raise with < 10 degree lag Restrictions Weight Bearing Restrictions: No Other Position/Activity Restrictions: WBAT     Mobility  Bed Mobility Overal bed mobility: Needs Assistance Bed Mobility: Supine to Sit     Supine to sit: Min assist     General bed mobility comments: Assist for L LE.  Transfers Overall transfer level: Needs assistance Equipment used: Rolling walker (2 wheeled) Transfers: Sit to/from Stand Sit to Stand: Min assist;Min guard         General transfer comment: close guard for safety. Vcs hand/LE placement. Increased time. Pt also required some time to stand at bedside before ambulating.  Ambulation/Gait Ambulation/Gait assistance: Min guard Gait Distance (Feet): 100 Feet Assistive device: Rolling walker (2 wheeled) Gait Pattern/deviations: Step-to pattern;Antalgic     General Gait Details: VCs safety, technique, sequence, L foot flat, increased WBing thru L LE/foot. Slow gait speed.    Stairs Stairs: Yes Stairs  assistance: Min assist Stair Management: Backwards;With walker;Step to pattern Number of Stairs: 2 General stair comments: VCs safety, technique, sequence. Assist to stabilize pt and walker. Husband present to observe and stabilize walker. Verbally discussed going forwards with RW through door.    Wheelchair Mobility    Modified Rankin (Stroke Patients Only)       Balance                                            Cognition Arousal/Alertness: Awake/alert Behavior During Therapy: WFL for tasks assessed/performed Overall Cognitive Status: Within Functional Limits for tasks assessed                                        Exercises      General Comments        Pertinent Vitals/Pain Pain Assessment: 0-10 Pain Score: 6  Pain Location: L knee, with mobility  Pain Descriptors / Indicators: Sore Pain Intervention(s): Monitored during session;Repositioned;Ice applied    Home Living                      Prior Function            PT Goals (current goals can now be found in the care plan section) Progress towards PT goals: Progressing toward goals    Frequency    7X/week      PT Plan Current  plan remains appropriate    Co-evaluation              AM-PAC PT "6 Clicks" Mobility   Outcome Measure  Help needed turning from your back to your side while in a flat bed without using bedrails?: A Little Help needed moving from lying on your back to sitting on the side of a flat bed without using bedrails?: A Little Help needed moving to and from a bed to a chair (including a wheelchair)?: A Little Help needed standing up from a chair using your arms (e.g., wheelchair or bedside chair)?: A Little Help needed to walk in hospital room?: A Little Help needed climbing 3-5 steps with a railing? : A Little 6 Click Score: 18    End of Session Equipment Utilized During Treatment: Gait belt Activity Tolerance: Patient limited by  pain Patient left: in chair;with call bell/phone within reach;with family/visitor present   PT Visit Diagnosis: Other abnormalities of gait and mobility (R26.89);Difficulty in walking, not elsewhere classified (R26.2)     Time: 1100-1134 PT Time Calculation (min) (ACUTE ONLY): 34 min  Charges:  $Gait Training: 23-37 mins                        Rebeca Alert, PT Acute Rehabilitation Services Pager: [phone] Office: [phone]

## 2018-02-20 NOTE — Progress Notes (Signed)
Reviewed discharge instructions, medications and dressing changes. Patient verbalizes understanding and has no further questions. Will continue to monitor.

## 2018-02-20 NOTE — Progress Notes (Signed)
   Subjective: 1 Day Post-Op Procedure(s) (LRB): TOTAL KNEE ARTHROPLASTY (Left) Patient reports pain as mild.   Patient seen in rounds by Dr. Lequita Halt. Patient is well, and has had no acute complaints or problems other than discomfort in the left knee. Denies chest pain, SOB, or calf pain. Foley catheter removed this AM. No issues overnight.  We will continue therapy today.   Objective: Vital signs in last 24 hours: Temp:  [97.4 F (36.3 C)-98.6 F (37 C)] 97.5 F (36.4 C) (02/18 0513) Pulse Rate:  [63-88] 64 (02/18 0513) Resp:  [9-20] 16 (02/17 1821) BP: (103-135)/(58-76) 112/66 (02/18 0513) SpO2:  [98 %-100 %] 99 % (02/18 0513) Weight:  [86.9 kg] 86.9 kg (02/17 1102)  Intake/Output from previous day:  Intake/Output Summary (Last 24 hours) at 02/20/2018 0723 Last data filed at 02/20/2018 0606 Gross per 24 hour  Intake 2998.99 ml  Output 3130 ml  Net -131.01 ml    Labs: Recent Labs    02/20/18 0454  HGB 10.5*   Recent Labs    02/20/18 0454  WBC 7.0  RBC 3.32*  HCT 33.3*  PLT 265   Recent Labs    02/20/18 0454  NA 133*  K 3.7  CL 104  CO2 23  BUN 11  CREATININE 0.74  GLUCOSE 106*  CALCIUM 8.3*   Exam: General - Patient is Alert and Oriented Extremity - Neurologically intact Neurovascular intact Sensation intact distally Dorsiflexion/Plantar flexion intact Dressing - dressing C/D/I Motor Function - intact, moving foot and toes well on exam.   Past Medical History:  Diagnosis Date  . Asthma   . Diverticulosis   . Esophagitis   . GERD (gastroesophageal reflux disease)   . Osteoarthritis of knees, 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  . Recurrent upper respiratory infection (URI)     Assessment/Plan: 1 Day Post-Op Procedure(s) (LRB): TOTAL KNEE ARTHROPLASTY (Left) Principal Problem:   Osteoarthritis of knees, bilateral Active Problems:   OA (osteoarthritis) of knee  Estimated body mass index is 27.49 kg/m as calculated from the following:   Height as of  this encounter: 5\' 10"  (1.778 m).   Weight as of this encounter: 86.9 kg. Advance diet Up with therapy D/C IV fluids  Anticipated LOS equal to or greater than 2 midnights due to - Age 72 and older with one or more of the following:  - Obesity  - Expected need for hospital services (PT, OT, Nursing) required for safe  discharge  - Anticipated need for postoperative skilled nursing care or inpatient rehab  - Active co-morbidities: None OR   - Unanticipated findings during/Post Surgery: None  - Patient is a high risk of re-admission due to: None    DVT Prophylaxis - Xarelto Weight bearing as tolerated. D/C O2 and pulse ox and try on room air. Hemovac pulled without difficulty, will continue therapy today.  Plan is to go Home after hospital stay. Plan for discharge later today if progresses with therapy and meeting her goals. Scheduled for outpatient physical therapy at [facility]. Follow-up in the office in 2 weeks.   Arther Abbott, PA-C Orthopedic Surgery 02/20/2018, 7:23 AM

## 2018-02-20 NOTE — Care Management Note (Signed)
Case Management Note  Patient Details  Name: Mandy Hanson MRN: [id number] Date of Birth: 1950-05-24  Subjective/Objective:                  Discharge planning  Action/Plan: dme-3 in 1 and rolling walker [facility] done as an outpt. Expected Discharge Date:  02/20/18               Expected Discharge Plan:  Home/Self Care  In-House Referral:     Discharge planning Services  CM Consult  Post Acute Care Choice:  Durable Medical Equipment Choice offered to:     DME Arranged:  3-N-1, Walker rolling DME Agency:  [facility]  HH Arranged:    [facility] Agency:     Status of Service:  Completed, signed off  If discussed at Long Length of Stay Meetings, dates discussed:    Additional Comments:  Golda Acre, RN 02/20/2018, 11:06 AM

## 2018-02-20 NOTE — Progress Notes (Signed)
Physical Therapy Treatment Patient Details Name: Mandy Hanson MRN: [id number] DOB: Mar 12, 1950 Today's Date: 02/20/2018    History of Present Illness 68 yo female s/p L TKR on 02/19/18. PMH includes contact dermatitis, OA, diverticulitis, GERD, recurrent URI.     PT Comments    Pt reports improved pain control this session. Reviewed/practiced exercises, gait training, and stair training. Issued HEP for pt to perform 2x/day until she begins OP PT. All education completed-made RN aware.    Follow Up Recommendations  Follow surgeon's recommendation for DC plan and follow-up therapies     Equipment Recommendations  Rolling walker with 5" wheels    Recommendations for Other Services       Precautions / Restrictions Precautions Precautions: Fall Required Braces or Orthoses: Knee Immobilizer - Left Knee Immobilizer - Left: Discontinue once straight leg raise with < 10 degree lag(pt able to SLR 2/18) Restrictions Weight Bearing Restrictions: No Other Position/Activity Restrictions: WBAT     Mobility  Bed Mobility               General bed mobility comments: oob in recliner  Transfers Overall transfer level: Needs assistance Equipment used: Rolling walker (2 wheeled) Transfers: Sit to/from Stand Sit to Stand: Min guard         General transfer comment: close guard for safety. Vcs hand/LE placement.  Ambulation/Gait Ambulation/Gait assistance: Min guard Gait Distance (Feet): 120 Feet Assistive device: Rolling walker (2 wheeled) Gait Pattern/deviations: Step-to pattern;Step-through pattern;Decreased stride length     General Gait Details: for safety. Pt beginning to use reciprocal gait pattern   Stairs Stairs: Yes Stairs assistance: Min assist Stair Management: Backwards;With walker;Step to pattern Number of Stairs: 2 General stair comments: VCs safety, technique, sequence. Assist to stabilize pt and walker. Husband present to observe and stabilize walker.  Verbally discussed going forwards with RW through door.    Wheelchair Mobility    Modified Rankin (Stroke Patients Only)       Balance                                            Cognition Arousal/Alertness: Awake/alert Behavior During Therapy: WFL for tasks assessed/performed Overall Cognitive Status: Within Functional Limits for tasks assessed                                        Exercises Total Joint Exercises Ankle Circles/Pumps: AROM;Both;10 reps;Seated Quad Sets: AROM;Both;10 reps;Seated Heel Slides: AAROM;Left;10 reps;Supine Hip ABduction/ADduction: AROM;Left;10 reps;Seated Straight Leg Raises: AROM;Left;10 reps;Seated Knee Flexion: AAROM;Left;10 reps;Seated Goniometric ROM: ~10-65 degrees    General Comments        Pertinent Vitals/Pain Pain Assessment: 0-10 Pain Score: 5  Pain Location: L knee Pain Descriptors / Indicators: Sore Pain Intervention(s): Monitored during session    Home Living                      Prior Function            PT Goals (current goals can now be found in the care plan section) Progress towards PT goals: Progressing toward goals    Frequency    7X/week      PT Plan Current plan remains appropriate    Co-evaluation  AM-PAC PT "6 Clicks" Mobility   Outcome Measure  Help needed turning from your back to your side while in a flat bed without using bedrails?: A Little Help needed moving from lying on your back to sitting on the side of a flat bed without using bedrails?: A Little Help needed moving to and from a bed to a chair (including a wheelchair)?: A Little Help needed standing up from a chair using your arms (e.g., wheelchair or bedside chair)?: A Little Help needed to walk in hospital room?: A Little Help needed climbing 3-5 steps with a railing? : A Little 6 Click Score: 18    End of Session Equipment Utilized During Treatment: Gait  belt Activity Tolerance: Patient tolerated treatment well Patient left: in chair;with call bell/phone within reach;with family/visitor present   PT Visit Diagnosis: Other abnormalities of gait and mobility (R26.89);Difficulty in walking, not elsewhere classified (R26.2)     Time: 1435-1500 PT Time Calculation (min) (ACUTE ONLY): 25 min  Charges:  $Gait Training: 8-22 mins $Therapeutic Exercise: 8-22 mins                        Rebeca Alert, PT Acute Rehabilitation Services Pager: [phone] Office: [phone]

## 2018-02-21 NOTE — Discharge Summary (Signed)
Physician Discharge Summary   Patient ID: Mandy Hanson MRN: [id number] DOB/AGE: 1950-06-03 68 y.o.  Admit date: 02/19/2018 Discharge date: 02/20/2018  Primary Diagnosis: Osteoarthritis, left knee   Admission Diagnoses:  Past Medical History:  Diagnosis Date  . Asthma   . Diverticulosis   . Esophagitis   . GERD (gastroesophageal reflux disease)   . Osteoarthritis of knees, 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  . Recurrent upper respiratory infection (URI)    Discharge Diagnoses:   Principal Problem:   Osteoarthritis of knees, bilateral Active Problems:   OA (osteoarthritis) of knee  Estimated body mass index is 27.49 kg/m as calculated from the following:   Height as of this encounter: 5\' 10"  (1.778 m).   Weight as of this encounter: 86.9 kg.  Procedure:  Procedure(s) (LRB): TOTAL KNEE ARTHROPLASTY (Left)   Consults: None  HPI: Mandy Hanson is a 68 y.o. 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 She has constant pain, with activity and at rest and significant functional deficits with difficulties even with ADLs. She has had extensive non-op management including analgesics, injections of cortisone and viscosupplements, and home exercise program, but remains in significant pain with significant dysfunction. Radiographs show bone on bone arthritis patellofemoral. She presents now for left Total Knee Arthroplasty.  Laboratory Data: Admission on 02/19/2018, Discharged on 02/20/2018  Component Date Value Ref Range Status  . WBC 02/20/2018 7.0  4.0 - 10.5 K/uL Final  . RBC 02/20/2018 3.32* 3.87 - 5.11 MIL/uL Final  . Hemoglobin 02/20/2018 10.5* 12.0 - 15.0 g/dL Final  . HCT 08/65/7846 33.3* 36.0 - 46.0 % Final  . MCV 02/20/2018 100.3* 80.0 - 100.0 fL Final  . MCH 02/20/2018 31.6  26.0 - 34.0 pg Final  . MCHC 02/20/2018 31.5  30.0 - 36.0 g/dL Final  . RDW 96/29/5284 12.6  11.5 - 15.5 % Final  . Platelets 02/20/2018 265  150 - 400  K/uL Final  . nRBC 02/20/2018 0.0  0.0 - 0.2 % Final   Performed at [facility], [address], [place], [place] [address]  . Sodium 02/20/2018 133* 135 - 145 mmol/L Final  . Potassium 02/20/2018 3.7  3.5 - 5.1 mmol/L Final  . Chloride 02/20/2018 104  98 - 111 mmol/L Final  . CO2 02/20/2018 23  22 - 32 mmol/L Final  . Glucose, Bld 02/20/2018 106* 70 - 99 mg/dL Final  . BUN 01/05/7251 11  8 - 23 mg/dL Final  . Creatinine, Ser 02/20/2018 0.74  0.44 - 1.00 mg/dL Final  . Calcium 66/44/0347 8.3* 8.9 - 10.3 mg/dL Final  . GFR calc non Af Amer 02/20/2018 >60  >60 mL/min Final  . GFR calc Af Amer 02/20/2018 >60  >60 mL/min Final  . Anion gap 02/20/2018 6  5 - 15 Final   Performed at [facility], [address], [place], [place] [address]  Hospital Outpatient Visit on 02/13/2018  Component Date Value Ref Range Status  . aPTT 02/13/2018 35  24 - 36 seconds Final   Performed at [facility], [address] [place], [place] [address]  . WBC 02/13/2018 4.8  4.0 - 10.5 K/uL Final  . RBC 02/13/2018 4.12  3.87 - 5.11 MIL/uL Final  . Hemoglobin 02/13/2018 13.3  12.0 - 15.0 g/dL Final  . HCT 64/33/2951 41.3  36.0 - 46.0 % Final  . MCV 02/13/2018 100.2* 80.0 - 100.0 fL Final  . MCH 02/13/2018 32.3  26.0 - 34.0 pg Final  . MCHC 02/13/2018 32.2  30.0 - 36.0 g/dL Final  . RDW 16/10/9602 13.0  11.5 - 15.5 % Final  . Platelets 02/13/2018 321  150 - 400 K/uL Final  . nRBC 02/13/2018 0.0  0.0 - 0.2 % Final   Performed at [facility], [address] [place], [place] [address]  . Sodium 02/13/2018 132* 135 - 145 mmol/L Final  . Potassium 02/13/2018 4.7  3.5 - 5.1 mmol/L Final  . Chloride 02/13/2018 98  98 - 111 mmol/L Final  . CO2 02/13/2018 25  22 - 32 mmol/L Final  . Glucose, Bld 02/13/2018 106* 70 - 99 mg/dL Final  . BUN 11/91/4782 12  8 - 23 mg/dL Final  . Creatinine, Ser 02/13/2018 0.82  0.44 - 1.00 mg/dL Final  .  Calcium 95/62/1308 9.3  8.9 - 10.3 mg/dL Final  . Total Protein 02/13/2018 7.7  6.5 - 8.1 g/dL Final  . Albumin 65/78/4696 4.5  3.5 - 5.0 g/dL Final  . AST 29/52/8413 33  15 - 41 U/L Final  . ALT 02/13/2018 25  0 - 44 U/L Final  . Alkaline Phosphatase 02/13/2018 54  38 - 126 U/L Final  . Total Bilirubin 02/13/2018 0.7  0.3 - 1.2 mg/dL Final  . GFR calc non Af Amer 02/13/2018 >60  >60 mL/min Final  . GFR calc Af Amer 02/13/2018 >60  >60 mL/min Final  . Anion gap 02/13/2018 9  5 - 15 Final   Performed at [facility], [address], [place], [place] [address]  . Prothrombin Time 02/13/2018 12.1  11.4 - 15.2 seconds Final  . INR 02/13/2018 0.90   Final   Performed at [facility], [address], [place], [place] [address]  . ABO/RH(D) 02/13/2018 O POS   Final  . Antibody Screen 02/13/2018 NEG   Final  . Sample Expiration 02/13/2018 02/22/2018   Final  . Extend sample reason 02/13/2018    Final                   Value:NO TRANSFUSIONS OR PREGNANCY IN THE PAST 3 MONTHS Performed at [facility], [address] [place], [place] [address]   . MRSA, PCR 02/13/2018 NEGATIVE  NEGATIVE Final  . Staphylococcus aureus 02/13/2018 NEGATIVE  NEGATIVE Final   Comment: (NOTE) The Xpert SA Assay (FDA approved for NASAL specimens in patients 64 years of age and older), is one component of a comprehensive surveillance program. It is not intended to diagnose infection nor to guide or monitor treatment. Performed at [facility], [address] [place], [place] [address]   . ABO/RH(D) 02/13/2018    Final                   Value:O POS Performed at [facility], [address], [place], [place] [address]      X-Rays:No results found.  EKG:No orders found for this or any previous visit.   Hospital Course: Mandy Hanson is a 68 y.o. who was admitted to [facility]. They were brought to the  operating room on 02/19/2018 and underwent Procedure(s): TOTAL KNEE ARTHROPLASTY.  Patient tolerated the procedure well and was later transferred to the recovery room and then to the orthopaedic floor for postoperative care. They were given PO and IV analgesics for pain control following their surgery. They were given 24 hours of postoperative antibiotics of  Anti-infectives (  From admission, onward)   Start     Dose/Rate Route Frequency Ordered Stop   02/19/18 1400  ceFAZolin (ANCEF) IVPB 2g/100 mL premix     2 g 200 mL/hr over 30 Minutes Intravenous Every 6 hours 02/19/18 1106 02/19/18 2022   02/19/18 0630  ceFAZolin (ANCEF) IVPB 2g/100 mL premix     2 g 200 mL/hr over 30 Minutes Intravenous On call to O.R. 02/19/18 3361 02/19/18 0843     and started on DVT prophylaxis in the form of Xarelto.   PT and OT were ordered for total joint protocol. Discharge planning consulted to help with postop disposition and equipment needs.  Patient had a good night on the evening of surgery. They started to get up OOB with therapy on POD #0. Pt was seen during rounds and was ready to go home pending progress with therapy. Hemovac drain was pulled without difficulty. She worked with therapy on POD #1 and was meeting her goals. Pt was discharged to home later that day in stable condition.  Diet: Regular diet Activity: WBAT Follow-up: in 2 weeks Disposition: Home with outpatient PT at [facility] Discharged Condition: stable   Discharge Instructions    Call MD / Call 911   Complete by:  As directed    If you experience chest pain or shortness of breath, CALL 911 and be transported to the hospital emergency room.  If you develope a fever above 101 F, pus (white drainage) or increased drainage or redness at the wound, or calf pain, call your surgeon's office.   Change dressing   Complete by:  As directed    Change dressing on Wednesday, then change the dressing daily with sterile 4 x 4 inch gauze dressing and  apply TED hose.   Constipation Prevention   Complete by:  As directed    Drink plenty of fluids.  Prune juice may be helpful.  You may use a stool softener, such as Colace (over the counter) 100 mg twice a day.  Use MiraLax (over the counter) for constipation as needed.   Diet - low sodium heart healthy   Complete by:  As directed    Discharge instructions   Complete by:  As directed    Dr. Ollen Gross Total Joint Specialist [facility] [address], [address] [place], [place] [address] [phone]  TOTAL KNEE REPLACEMENT POSTOPERATIVE DIRECTIONS  Knee Rehabilitation, Guidelines Following Surgery  Results after knee surgery are often greatly improved when you follow the exercise, range of motion and muscle strengthening exercises prescribed by your doctor. Safety measures are also important to protect the knee from further injury. Any time any of these exercises cause you to have increased pain or swelling in your knee joint, decrease the amount until you are comfortable again and slowly increase them. If you have problems or questions, call your caregiver or physical therapist for advice.   HOME CARE INSTRUCTIONS  Remove items at home which could result in a fall. This includes throw rugs or furniture in walking pathways.  ICE to the affected knee every three hours for 30 minutes at a time and then as needed for pain and swelling.  Continue to use ice on the knee for pain and swelling from surgery. You may notice swelling that will progress down to the foot and ankle.  This is normal after surgery.  Elevate the leg when you are not up walking on it.   Continue to use the breathing machine which will help keep  your temperature down.  It is common for your temperature to cycle up and down following surgery, especially at night when you are not up moving around and exerting yourself.  The breathing machine keeps your lungs expanded and your temperature down. Do not place pillow under  knee, focus on keeping the knee straight while resting   DIET You may resume your previous home diet once your are discharged from the hospital.  DRESSING / WOUND CARE / SHOWERING You may shower 3 days after surgery, but keep the wounds dry during showering.  You may use an occlusive plastic wrap (Press'n Seal for example), NO SOAKING/SUBMERGING IN THE BATHTUB.  If the bandage gets wet, change with a clean dry gauze.  If the incision gets wet, pat the wound dry with a clean towel. You may start showering once you are discharged home but do not submerge the incision under water. Just pat the incision dry and apply a dry gauze dressing on daily. Change the surgical dressing daily and reapply a dry dressing each time.  ACTIVITY Walk with your walker as instructed. Use walker as long as suggested by your caregivers. Avoid periods of inactivity such as sitting longer than an hour when not asleep. This helps prevent blood clots.  You may resume a sexual relationship in one month or when given the OK by your doctor.  You may return to work once you are cleared by your doctor.  Do not drive a car for 6 weeks or until released by you surgeon.  Do not drive while taking narcotics.  WEIGHT BEARING Weight bearing as tolerated with assist device (walker, cane, etc) as directed, use it as long as suggested by your surgeon or therapist, typically at least 4-6 weeks.  POSTOPERATIVE CONSTIPATION PROTOCOL Constipation - defined medically as fewer than three stools per week and severe constipation as less than one stool per week.  One of the most common issues patients have following surgery is constipation.  Even if you have a regular bowel pattern at home, your normal regimen is likely to be disrupted due to multiple reasons following surgery.  Combination of anesthesia, postoperative narcotics, change in appetite and fluid intake all can affect your bowels.  In order to avoid complications following  surgery, here are some recommendations in order to help you during your recovery period.  Colace (docusate) - Pick up an over-the-counter form of Colace or another stool softener and take twice a day as long as you are requiring postoperative pain medications.  Take with a full glass of water daily.  If you experience loose stools or diarrhea, hold the colace until you stool forms back up.  If your symptoms do not get better within 1 week or if they get worse, check with your doctor.  Dulcolax (bisacodyl) - Pick up over-the-counter and take as directed by the product packaging as needed to assist with the movement of your bowels.  Take with a full glass of water.  Use this product as needed if not relieved by Colace only.   MiraLax (polyethylene glycol) - Pick up over-the-counter to have on hand.  MiraLax is a solution that will increase the amount of water in your bowels to assist with bowel movements.  Take as directed and can mix with a glass of water, juice, soda, coffee, or tea.  Take if you go more than two days without a movement. Do not use MiraLax more than once per day. Call your doctor if you are still  constipated or irregular after using this medication for 7 days in a row.  If you continue to have problems with postoperative constipation, please contact the office for further assistance and recommendations.  If you experience "the worst abdominal pain ever" or develop nausea or vomiting, please contact the office immediatly for further recommendations for treatment.  ITCHING  If you experience itching with your medications, try taking only a single pain pill, or even half a pain pill at a time.  You can also use Benadryl over the counter for itching or also to help with sleep.   TED HOSE STOCKINGS Wear the elastic stockings on both legs for three weeks following surgery during the day but you may remove then at night for sleeping.  MEDICATIONS See your medication summary on the "After  Visit Summary" that the nursing staff will review with you prior to discharge.  You may have some home medications which will be placed on hold until you complete the course of blood thinner medication.  It is important for you to complete the blood thinner medication as prescribed by your surgeon.  Continue your approved medications as instructed at time of discharge.  PRECAUTIONS If you experience chest pain or shortness of breath - call 911 immediately for transfer to the hospital emergency department.  If you develop a fever greater that 101 F, purulent drainage from wound, increased redness or drainage from wound, foul odor from the wound/dressing, or calf pain - CONTACT YOUR SURGEON.                                                   FOLLOW-UP APPOINTMENTS Make sure you keep all of your appointments after your operation with your surgeon and caregivers. You should call the office at the above phone number and make an appointment for approximately two weeks after the date of your surgery or on the date instructed by your surgeon outlined in the "After Visit Summary".   RANGE OF MOTION AND STRENGTHENING EXERCISES  Rehabilitation of the knee is important following a knee injury or an operation. After just a few days of immobilization, the muscles of the thigh which control the knee become weakened and shrink (atrophy). Knee exercises are designed to build up the tone and strength of the thigh muscles and to improve knee motion. Often times heat used for twenty to thirty minutes before working out will loosen up your tissues and help with improving the range of motion but do not use heat for the first two weeks following surgery. These exercises can be done on a training (exercise) mat, on the floor, on a table or on a bed. Use what ever works the best and is most comfortable for you Knee exercises include:  Leg Lifts - While your knee is still immobilized in a splint or cast, you can do straight leg  raises. Lift the leg to 60 degrees, hold for 3 sec, and slowly lower the leg. Repeat 10-20 times 2-3 times daily. Perform this exercise against resistance later as your knee gets better.  Quad and Hamstring Sets - Tighten up the muscle on the front of the thigh (Quad) and hold for 5-10 sec. Repeat this 10-20 times hourly. Hamstring sets are done by pushing the foot backward against an object and holding for 5-10 sec. Repeat as with quad sets.  Leg Slides: Lying on your back, slowly slide your foot toward your buttocks, bending your knee up off the floor (only go as far as is comfortable). Then slowly slide your foot back down until your leg is flat on the floor again. Angel Wings: Lying on your back spread your legs to the side as far apart as you can without causing discomfort.  A rehabilitation program following serious knee injuries can speed recovery and prevent re-injury in the future due to weakened muscles. Contact your doctor or a physical therapist for more information on knee rehabilitation.   IF YOU ARE TRANSFERRED TO A SKILLED REHAB FACILITY If the patient is transferred to a skilled rehab facility following release from the hospital, a list of the current medications will be sent to the facility for the patient to continue.  When discharged from the skilled rehab facility, please have the facility set up the patient's Home Health Physical Therapy prior to being released. Also, the skilled facility will be responsible for providing the patient with their medications at time of release from the facility to include their pain medication, the muscle relaxants, and their blood thinner medication. If the patient is still at the rehab facility at time of the two week follow up appointment, the skilled rehab facility will also need to assist the patient in arranging follow up appointment in our office and any transportation needs.  MAKE SURE YOU:  Understand these instructions.  Get help right away  if you are not doing well or get worse.    Pick up stool softner and laxative for home use following surgery while on pain medications. Do not submerge incision under water. Please use good hand washing techniques while changing dressing each day. May shower starting three days after surgery. Please use a clean towel to pat the incision dry following showers. Continue to use ice for pain and swelling after surgery. Do not use any lotions or creams on the incision until instructed by your surgeon.   Do not put a pillow under the knee. Place it under the heel.   Complete by:  As directed    Driving restrictions   Complete by:  As directed    No driving for two weeks   TED hose   Complete by:  As directed    Use stockings (TED hose) for three weeks on both leg(s).  You may remove them at night for sleeping.   Weight bearing as tolerated   Complete by:  As directed      Allergies as of 02/20/2018      Reactions   Tape Itching      Medication List    STOP taking these medications   CALCIUM + VITAMIN D3 PO     TAKE these medications   albuterol 108 (90 Base) MCG/ACT inhaler Commonly known as:  PROVENTIL HFA;VENTOLIN HFA Inhale 2 puffs into the lungs every 6 (six) hours as needed for wheezing or shortness of breath.   albuterol 108 (90 Base) MCG/ACT inhaler Commonly known as:  PROVENTIL HFA;VENTOLIN HFA Inhale 2 puffs into the lungs every 6 (six) hours as needed for wheezing or shortness of breath.   azelastine 0.1 % nasal spray Commonly known as:  ASTELIN Place 2 sprays into both nostrils 2 (two) times daily.   CITRUCEL PO Take 2 tablets by mouth daily.   esomeprazole 20 MG capsule Commonly known as:  NEXIUM Take 20 mg by mouth daily.   fluticasone 50 MCG/ACT nasal spray Commonly  known as:  FLONASE Place 2 sprays into both nostrils 2 (two) times daily.   gabapentin 300 MG capsule Commonly known as:  NEURONTIN Take 1 capsule (300 mg total) by mouth 3 (three) times  daily. Take a 300 mg capsule three times a day for two weeks following surgery.Then take a 300 mg capsule two times a day for two weeks. Then take a 300 mg capsule once a day for two weeks. Then discontinue.   lubiprostone 24 MCG capsule Commonly known as:  AMITIZA Take 24 mcg by mouth 2 (two) times daily with a meal.   methocarbamol 500 MG tablet Commonly known as:  ROBAXIN Take 1 tablet (500 mg total) by mouth every 6 (six) hours as needed for muscle spasms.   montelukast 10 MG tablet Commonly known as:  SINGULAIR Take 10 mg by mouth daily.   oxyCODONE 5 MG immediate release tablet Commonly known as:  Oxy IR/ROXICODONE Take 1-2 tablets (5-10 mg total) by mouth every 6 (six) hours as needed for severe pain.   rivaroxaban 10 MG Tabs tablet Commonly known as:  XARELTO Take 1 tablet (10 mg total) by mouth daily with breakfast for 20 days. Then take one 81 mg aspirin once a day for three weeks. Then discontinue aspirin.   traMADol 50 MG tablet Commonly known as:  ULTRAM Take 1-2 tablets (50-100 mg total) by mouth every 6 (six) hours as needed for moderate pain.            Discharge Care Instructions  (From admission, onward)         Start     Ordered   02/20/18 0000  Weight bearing as tolerated     02/20/18 0726   02/20/18 0000  Change dressing    Comments:  Change dressing on Wednesday, then change the dressing daily with sterile 4 x 4 inch gauze dressing and apply TED hose.   02/20/18 0726         Follow-up Information    Ollen Gross, MD. Schedule an appointment as soon as possible for a visit on 03/06/2018.   Specialty:  Orthopedic Surgery Contact information: [address] [place] [address] [place] [address] [phone]           Signed: Arther Abbott, PA-C Orthopedic Surgery 02/21/2018, 9:32 AM

## 2018-02-26 ENCOUNTER — Encounter (INDEPENDENT_AMBULATORY_CARE_PROVIDER_SITE_OTHER): Payer: Medicare Other | Admitting: Ophthalmology

## 2018-02-26 DIAGNOSIS — H4423 Degenerative myopia, bilateral: Secondary | ICD-10-CM | POA: Diagnosis not present

## 2018-02-26 DIAGNOSIS — H2702 Aphakia, left eye: Secondary | ICD-10-CM | POA: Diagnosis not present

## 2018-05-22 ENCOUNTER — Other Ambulatory Visit: Payer: Self-pay | Admitting: Family Medicine

## 2018-05-22 DIAGNOSIS — Z1231 Encounter for screening mammogram for malignant neoplasm of breast: Secondary | ICD-10-CM

## 2018-06-13 ENCOUNTER — Encounter (INDEPENDENT_AMBULATORY_CARE_PROVIDER_SITE_OTHER): Payer: Medicare Other | Admitting: Ophthalmology

## 2018-06-13 ENCOUNTER — Other Ambulatory Visit: Payer: Self-pay

## 2018-06-13 DIAGNOSIS — H33302 Unspecified retinal break, left eye: Secondary | ICD-10-CM | POA: Diagnosis not present

## 2018-06-13 DIAGNOSIS — H26491 Other secondary cataract, right eye: Secondary | ICD-10-CM | POA: Diagnosis not present

## 2018-06-13 DIAGNOSIS — H43813 Vitreous degeneration, bilateral: Secondary | ICD-10-CM | POA: Diagnosis not present

## 2018-06-13 DIAGNOSIS — H4423 Degenerative myopia, bilateral: Secondary | ICD-10-CM | POA: Diagnosis not present

## 2018-07-02 ENCOUNTER — Other Ambulatory Visit: Payer: Self-pay

## 2018-07-02 ENCOUNTER — Encounter (INDEPENDENT_AMBULATORY_CARE_PROVIDER_SITE_OTHER): Payer: Medicare Other | Admitting: Ophthalmology

## 2018-07-02 DIAGNOSIS — H2701 Aphakia, right eye: Secondary | ICD-10-CM

## 2018-07-09 ENCOUNTER — Ambulatory Visit: Payer: Medicare Other

## 2018-08-28 ENCOUNTER — Other Ambulatory Visit: Payer: Self-pay

## 2018-08-28 ENCOUNTER — Ambulatory Visit
Admission: RE | Admit: 2018-08-28 | Discharge: 2018-08-28 | Disposition: A | Discharge status: Home / Self Care | Payer: Medicare Other | Source: Ambulatory Visit | Attending: Family Medicine | Admitting: Family Medicine

## 2018-08-28 DIAGNOSIS — Z1231 Encounter for screening mammogram for malignant neoplasm of breast: Secondary | ICD-10-CM

## 2018-12-19 NOTE — H&P (Signed)
TOTAL KNEE ADMISSION H&P  Patient is being admitted for right total knee arthroplasty.  Subjective:  Chief Complaint:right knee pain.  HPI: Mandy Hanson, 68 y.o. female, has a history of pain and functional disability in the right knee due to arthritis and has failed non-surgical conservative treatments for greater than 12 weeks to includecorticosteriod injections and activity modification.  Onset of symptoms was gradual, starting several years ago with gradually worsening course since that time. The patient noted no past surgery on the right knee(s).  Patient currently rates pain in the right knee(s) at 5 out of 10 with activity. Patient has worsening of pain with activity and weight bearing, pain with passive range of motion, crepitus and instability.  Patient has evidence of severe patellofemoral arthritis bilaterally with medial and lateral marginal osteophytes by imaging studies. This patient has had proximal tibial fracture. There is no active infection.  Patient Active Problem List   Diagnosis Date Noted  . OA (osteoarthritis) of knee 02/19/2018  . Allergic contact dermatitis 02/07/2018  . Osteoarthritis of knees, 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  . Transaminitis 01/20/2012  . Epigastric abdominal pain 01/20/2012  . Nausea and vomiting in adult 01/18/2012  . Hypokalemia 01/18/2012  . Diverticulitis of colon (without mention of hemorrhage)(562.11) 01/14/2012  . Hyponatremia 01/14/2012  . GERD (gastroesophageal reflux disease) 01/14/2012   Past Medical History:  Diagnosis Date  . Asthma   . Diverticulosis   . Esophagitis   . GERD (gastroesophageal reflux disease)   . Osteoarthritis of knees, 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  . Recurrent upper respiratory infection (URI)     Past Surgical History:  Procedure Laterality Date  . ABDOMINAL HYSTERECTOMY    . ADENOIDECTOMY    . BREAST BIOPSY Right 09/24/2014  . BREAST EXCISIONAL BIOPSY Right 11/20/2014  . BREAST LUMPECTOMY WITH RADIOACTIVE SEED  LOCALIZATION Right 11/20/2014   Procedure: RIGHT BREAST LUMPECTOMY WITH RADIOACTIVE SEED LOCALIZATION;  Surgeon: Chevis Pretty III, MD;  Location: [facility];  Service: General;  Laterality: Right;  . ESOPHAGOGASTRODUODENOSCOPY  01/25/2012   Procedure: ESOPHAGOGASTRODUODENOSCOPY (EGD);  Surgeon: Barrie Folk, MD;  Location: [facility];  Service: Endoscopy;  Laterality: N/A;  . 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 TONSILLECTOMY    . TOTAL KNEE ARTHROPLASTY Left 02/19/2018   Procedure: TOTAL KNEE ARTHROPLASTY;  Surgeon: Ollen Gross, MD;  Location: [facility];  Service: Orthopedics;  Laterality: Left;     No current facility-administered medications for this encounter.   Current Outpatient Medications  Medication Sig Dispense Refill Last Dose  . albuterol (PROVENTIL HFA;VENTOLIN HFA) 108 (90 Base) MCG/ACT inhaler Inhale 2 puffs into the lungs every 6 (six) hours as needed for wheezing or shortness of breath.     Marland Kitchen albuterol (PROVENTIL HFA;VENTOLIN HFA) 108 (90 Base) MCG/ACT inhaler Inhale 2 puffs into the lungs every 6 (six) hours as needed for wheezing or shortness of breath. 3 Inhaler 1   . azelastine (ASTELIN) 137 MCG/SPRAY nasal spray Place 2 sprays into both nostrils 2 (two) times daily.      Marland Kitchen esomeprazole (NEXIUM) 20 MG capsule Take 20 mg by mouth daily.      . fluticasone (FLONASE) 50 MCG/ACT nasal spray Place 2 sprays into both nostrils 2 (two) times daily.     Marland Kitchen gabapentin (NEURONTIN) 300 MG capsule Take 1 capsule (300 mg total) by mouth 3 (three) times daily. Take a 300 mg capsule three times a day for two weeks following surgery.Then take a 300 mg capsule two times a  day for two weeks. Then take a 300 mg capsule once a day for two weeks. Then discontinue. 84 capsule 0   . lubiprostone (AMITIZA) 24 MCG capsule Take 24 mcg by mouth 2 (two) times daily with a meal.     . methocarbamol (ROBAXIN) 500 MG tablet Take 1 tablet (500 mg total) by mouth every 6 (six) hours as  needed for muscle spasms. 40 tablet 0   . Methylcellulose, Laxative, (CITRUCEL PO) Take 2 tablets by mouth daily.     . montelukast (SINGULAIR) 10 MG tablet Take 10 mg by mouth daily.      Marland Kitchen oxyCODONE (OXY IR/ROXICODONE) 5 MG immediate release tablet Take 1-2 tablets (5-10 mg total) by mouth every 6 (six) hours as needed for severe pain. 56 tablet 0   . rivaroxaban (XARELTO) 10 MG TABS tablet Take 1 tablet (10 mg total) by mouth daily with breakfast for 20 days. Then take one 81 mg aspirin once a day for three weeks. Then discontinue aspirin. 20 tablet 0   . traMADol (ULTRAM) 50 MG tablet Take 1-2 tablets (50-100 mg total) by mouth every 6 (six) hours as needed for moderate pain. 40 tablet 0    Allergies  Allergen Reactions  . Tape Itching    Social History   Tobacco Use  . Smoking status: Never Smoker  . Smokeless tobacco: Never Used  Substance Use Topics  . Alcohol use: Yes    Comment: Occas    No family history on file.   Review of Systems  Constitutional: Negative for chills and fever.  HENT: Negative for congestion, sore throat and tinnitus.   Eyes: Negative for photophobia and pain.  Respiratory: Negative for cough, shortness of breath and wheezing.   Cardiovascular: Negative for chest pain and palpitations.  Gastrointestinal: Negative for nausea and vomiting.  Genitourinary: Negative for dysuria, frequency and urgency.  Neurological: Negative for dizziness, weakness and headaches.    Objective:  Physical Exam  Well nourished and well developed.  General: Alert and oriented x3, cooperative and pleasant, no acute distress.  Head: normocephalic, atraumatic, neck supple.  Eyes: EOMI.  Respiratory: breath sounds clear in all fields, no wheezing, rales, or rhonchi. Cardiovascular: Regular rate and rhythm, no murmurs, gallops or rubs.  Abdomen: non-tender to palpation and soft, normoactive bowel sounds. Musculoskeletal:  Right Knee Exam:  No effusion.  Range of motion  is 0-125 degrees.  Marked crepitus on range of motion of the knee.  Positive anterior tenderness. No medial joint line tenderness. No lateral joint line tenderness.  Stable knee.  Calves soft and nontender. Motor function intact in LE. Strength 5/5 LE bilaterally. Neuro: Distal pulses 2+. Sensation to light touch intact in LE.   Vital signs in last 24 hours: Blood pressure: 128/70 mmHg Pulse: 68 bpm  Labs:   Estimated body mass index is 27.49 kg/m as calculated from the following:   Height as of 02/19/18: 5\' 10"  (1.778 m).   Weight as of 02/19/18: 86.9 kg.   Imaging Review Plain radiographs demonstrate severe degenerative joint disease of the right knee(s). The overall alignment isneutral. The bone quality appears to be adequate for age and reported activity level.  Assessment/Plan:  End stage arthritis, right knee   The patient history, physical examination, clinical judgment of the provider and imaging studies are consistent with end stage degenerative joint disease of the right knee(s) and total knee arthroplasty is deemed medically necessary. The treatment options including medical management, injection therapy arthroscopy and arthroplasty were  discussed at length. The risks and benefits of total knee arthroplasty were presented and reviewed. The risks due to aseptic loosening, infection, stiffness, patella tracking problems, thromboembolic complications and other imponderables were discussed. The patient acknowledged the explanation, agreed to proceed with the plan and consent was signed. Patient is being admitted for inpatient treatment for surgery, pain control, PT, OT, prophylactic antibiotics, VTE prophylaxis, progressive ambulation and ADL's and discharge planning. The patient is planning to be discharged home.  Anticipated LOS equal to or greater than 2 midnights due to - Age 67 and older with one or more of the following:  - Obesity  - Expected need for hospital services  (PT, OT, Nursing) required for safe  discharge  - Anticipated need for postoperative skilled nursing care or inpatient rehab  - Active co-morbidities: None OR   - Unanticipated findings during/Post Surgery: None  - Patient is a high risk of re-admission due to: None  Therapy Plans[facility] Disposition: Home with husband Planned DVT Prophylaxis: Aspirin 325 mg BID DME needed: None PCP: Merri Brunetteandace Smith, MD TXA: IV Allergies: NKDA Anesthesia Concerns: None BMI: 27 Other: Patient eligible for SDD  - Patient was instructed on what medications to stop prior to surgery. - Follow-up visit in 2 weeks with Dr. Lequita HaltAluisio - Begin physical therapy following surgery - Pre-operative lab work as pre-surgical testing - Prescriptions will be provided in hospital at time of discharge  Arther AbbottKristie Makyah Lavigne, PA-C Orthopedic Surgery [facility] [place]

## 2019-01-08 NOTE — Patient Instructions (Addendum)
DUE TO COVID-19 ONLY ONE VISITOR IS ALLOWED TO COME WITH YOU AND STAY IN THE WAITING ROOM ONLY DURING PRE OP AND PROCEDURE DAY OF SURGERY. THE 1 VISITOR MAY VISIT WITH YOU AFTER SURGERY IN YOUR PRIVATE ROOM DURING VISITING HOURS ONLY!  YOU NEED TO HAVE A COVID 19 TEST ON 01-10-19 @ 3:15 PM, THIS TEST MUST BE DONE BEFORE SURGERY, COME  [address], [place] , [address].  [facility]) ONCE YOUR COVID TEST IS COMPLETED, PLEASE BEGIN THE QUARANTINE INSTRUCTIONS AS OUTLINED IN YOUR HANDOUT.                Mandy Hanson  01/08/2019   Your procedure is scheduled on: 01-14-19   Report to [facility] Main  Entrance    Report to Admitting at 6:55 AM     Call this number if you have problems the morning of surgery [phone]    Remember:NO SOLID FOOD AFTER MIDNIGHT THE NIGHT PRIOR TO SURGERY. NOTHING BY MOUTH EXCEPT CLEAR LIQUIDS UNTIL 6:25 AM . PLEASE FINISH ENSURE DRINK PER SURGEON ORDER  WHICH NEEDS TO BE COMPLETED AT 6:25 AM.   CLEAR LIQUID DIET   Foods Allowed                                                                     Foods Excluded  Coffee and tea, regular and decaf                             liquids that you cannot  Plain Jell-O any favor except red or purple                                           see through such as: Fruit ices (not with fruit pulp)                                     milk, soups, orange juice  Iced Popsicles                                    All solid food Carbonated beverages, regular and diet                                    Cranberry, grape and apple juices Sports drinks like Gatorade Lightly seasoned clear broth or consume(fat free) Sugar, honey syrup  Sample Menu Breakfast                                Lunch                                     Supper Cranberry juice  Beef broth                            Chicken broth Jell-O                                     Grape juice                            Apple juice Coffee or tea                        Jell-O                                      Popsicle                                                Coffee or tea                        Coffee or tea  _____________________________________________________________________     Take these medicines the morning of surgery with A SIP OF WATER: Esomeprazole (Nexium), Nasal Spray, and Inhaler  BRUSH YOUR TEETH MORNING OF SURGERY AND RINSE YOUR MOUTH OUT, NO CHEWING GUM CANDY OR MINTS.                             You may not have any metal on your body including hair pins and              piercings     Do not wear jewelry, make-up, lotions, powders or perfumes, deodorant              Do not wear nail polish on your fingernails.  Do not shave  48 hours prior to surgery.               Do not bring valuables to the hospital. [name]  Contacts, dentures or bridgework may not be worn into surgery.      Patients discharged the day of surgery will not be allowed to drive home. IF YOU ARE HAVING SURGERY AND GOING HOME THE SAME DAY, YOU MUST HAVE AN ADULT TO DRIVE YOU HOME AND BE WITH YOU FOR 24 HOURS. YOU MAY GO HOME BY TAXI OR UBER OR ORTHERWISE, BUT AN ADULT MUST ACCOMPANY YOU HOME AND STAY WITH YOU FOR 24 HOURS.  Name and phone number of your driver:Mandy Hanson [phone]  Special Instructions: N/A              Please read over the following fact sheets you were given: _____________________________________________________________________             [facility] - Preparing for Surgery Before surgery, you can play an important role.  Because skin is not sterile, your skin needs to be as free of germs as possible.  You can reduce the number of germs on your skin by washing with CHG (  chlorahexidine gluconate) soap before surgery.  CHG is an antiseptic cleaner which kills germs and bonds with the skin to continue killing germs even after  washing. Please DO NOT use if you have an allergy to CHG or antibacterial soaps.  If your skin becomes reddened/irritated stop using the CHG and inform your nurse when you arrive at Short Stay. Do not shave (including legs and underarms) for at least 48 hours prior to the first CHG shower.  You may shave your face/neck. Please follow these instructions carefully:  1.  Shower with CHG Soap the night before surgery and the  morning of Surgery.  2.  If you choose to wash your hair, wash your hair first as usual with your  normal  shampoo.  3.  After you shampoo, rinse your hair and body thoroughly to remove the  shampoo.                           4.  Use CHG as you would any other liquid soap.  You can apply chg directly  to the skin and wash                       Gently with a scrungie or clean washcloth.  5.  Apply the CHG Soap to your body ONLY FROM THE NECK DOWN.   Do not use on face/ open                           Wound or open sores. Avoid contact with eyes, ears mouth and genitals (private parts).                       Wash face,  Genitals (private parts) with your normal soap.             6.  Wash thoroughly, paying special attention to the area where your surgery  will be performed.  7.  Thoroughly rinse your body with warm water from the neck down.  8.  DO NOT shower/wash with your normal soap after using and rinsing off  the CHG Soap.                9.  Pat yourself dry with a clean towel.            10.  Wear clean pajamas.            11.  Place clean sheets on your bed the night of your first shower and do not  sleep with pets. Day of Surgery : Do not apply any lotions/deodorants the morning of surgery.  Please wear clean clothes to the hospital/surgery center.  FAILURE TO FOLLOW THESE INSTRUCTIONS MAY RESULT IN THE CANCELLATION OF YOUR SURGERY PATIENT SIGNATURE_________________________________  NURSE  SIGNATURE__________________________________  ________________________________________________________________________   Mandy Hanson  An incentive spirometer is a tool that can help keep your lungs clear and active. This tool measures how well you are filling your lungs with each breath. Taking long deep breaths may help reverse or decrease the chance of developing breathing (pulmonary) problems (especially infection) following:  A long period of time when you are unable to move or be active. BEFORE THE PROCEDURE   If the spirometer includes an indicator to show your best effort, your nurse or respiratory therapist will set it to a desired goal.  If possible, sit up straight or lean  slightly forward. Try not to slouch.  Hold the incentive spirometer in an upright position. INSTRUCTIONS FOR USE  1. Sit on the edge of your bed if possible, or sit up as far as you can in bed or on a chair. 2. Hold the incentive spirometer in an upright position. 3. Breathe out normally. 4. Place the mouthpiece in your mouth and seal your lips tightly around it. 5. Breathe in slowly and as deeply as possible, raising the piston or the ball toward the top of the column. 6. Hold your breath for 3-5 seconds or for as long as possible. Allow the piston or ball to fall to the bottom of the column. 7. Remove the mouthpiece from your mouth and breathe out normally. 8. Rest for a few seconds and repeat Steps 1 through 7 at least 10 times every 1-2 hours when you are awake. Take your time and take a few normal breaths between deep breaths. 9. The spirometer may include an indicator to show your best effort. Use the indicator as a goal to work toward during each repetition. 10. After each set of 10 deep breaths, practice coughing to be sure your lungs are clear. If you have an incision (the cut made at the time of surgery), support your incision when coughing by placing a pillow or rolled up towels firmly  against it. Once you are able to get out of bed, walk around indoors and cough well. You may stop using the incentive spirometer when instructed by your caregiver.  RISKS AND COMPLICATIONS  Take your time so you do not get dizzy or light-headed.  If you are in pain, you may need to take or ask for pain medication before doing incentive spirometry. It is harder to take a deep breath if you are having pain. AFTER USE  Rest and breathe slowly and easily.  It can be helpful to keep track of a log of your progress. Your caregiver can provide you with a simple table to help with this. If you are using the spirometer at home, follow these instructions: [place] IF:   You are having difficultly using the spirometer.  You have trouble using the spirometer as often as instructed.  Your pain medication is not giving enough relief while using the spirometer.  You develop fever of 100.5 F (38.1 C) or higher. SEEK IMMEDIATE MEDICAL CARE IF:   You cough up bloody sputum that had not been present before.  You develop fever of 102 F (38.9 C) or greater.  You develop worsening pain at or near the incision site. MAKE SURE YOU:   Understand these instructions.  Will watch your condition.  Will get help right away if you are not doing well or get worse. Document Released: 05/02/2006 Document Revised: 03/14/2011 Document Reviewed: 07/03/2006 Exit[employer] Patient Information 2014 Exit[employer], [place].   ________________________________________________________________________  WHAT IS A BLOOD TRANSFUSION? Blood Transfusion Information  A transfusion is the replacement of blood or some of its parts. Blood is made up of multiple cells which provide different functions.  Red blood cells carry oxygen and are used for blood loss replacement.  White blood cells fight against infection.  Platelets control bleeding.  Plasma helps clot blood.  Other blood products are available for  specialized needs, such as hemophilia or other clotting disorders. BEFORE THE TRANSFUSION  Who gives blood for transfusions?   Healthy volunteers who are fully evaluated to make sure their blood is safe. This is blood bank blood. Transfusion  therapy is the safest it has ever been in the practice of medicine. Before blood is taken from a donor, a complete history is taken to make sure that person has no history of diseases nor engages in risky social behavior (examples are intravenous drug use or sexual activity with multiple partners). The donor's travel history is screened to minimize risk of transmitting infections, such as malaria. The donated blood is tested for signs of infectious diseases, such as HIV and hepatitis. The blood is then tested to be sure it is compatible with you in order to minimize the chance of a transfusion reaction. If you or a relative donates blood, this is often done in anticipation of surgery and is not appropriate for emergency situations. It takes many days to process the donated blood. RISKS AND COMPLICATIONS Although transfusion therapy is very safe and saves many lives, the main dangers of transfusion include:   Getting an infectious disease.  Developing a transfusion reaction. This is an allergic reaction to something in the blood you were given. Every precaution is taken to prevent this. The decision to have a blood transfusion has been considered carefully by your caregiver before blood is given. Blood is not given unless the benefits outweigh the risks. AFTER THE TRANSFUSION  Right after receiving a blood transfusion, you will usually feel much better and more energetic. This is especially true if your red blood cells have gotten low (anemic). The transfusion raises the level of the red blood cells which carry oxygen, and this usually causes an energy increase.  The nurse administering the transfusion will monitor you carefully for complications. HOME CARE  INSTRUCTIONS  No special instructions are needed after a transfusion. You may find your energy is better. Speak with your caregiver about any limitations on activity for underlying diseases you may have. SEEK MEDICAL CARE IF:   Your condition is not improving after your transfusion.  You develop redness or irritation at the intravenous (IV) site. SEEK IMMEDIATE MEDICAL CARE IF:  Any of the following symptoms occur over the next 12 hours:  Shaking chills.  You have a temperature by mouth above 102 F (38.9 C), not controlled by medicine.  Chest, back, or muscle pain.  People around you feel you are not acting correctly or are confused.  Shortness of breath or difficulty breathing.  Dizziness and fainting.  You get a rash or develop hives.  You have a decrease in urine output.  Your urine turns a dark color or changes to pink, red, or brown. Any of the following symptoms occur over the next 10 days:  You have a temperature by mouth above 102 F (38.9 C), not controlled by medicine.  Shortness of breath.  Weakness after normal activity.  The white part of the eye turns yellow (jaundice).  You have a decrease in the amount of urine or are urinating less often.  Your urine turns a dark color or changes to pink, red, or brown. Document Released: 12/18/1999 Document Revised: 03/14/2011 Document Reviewed: 08/06/2007 [facility] Patient Information 2014 [place], [place].  _______________________________________________________________________

## 2019-01-08 NOTE — Progress Notes (Signed)
PCP - Merri Brunette  Cardiologist -   Chest x-ray -  EKG -  Stress Test -  ECHO -  Cardiac Cath -   Sleep Study -  CPAP -   Fasting Blood Sugar -  Checks Blood Sugar _____ times a day  Blood Thinner Instructions: Aspirin Instructions: Last Dose:  Anesthesia review:   Patient denies shortness of breath, fever, cough and chest pain at PAT appointment   Patient verbalized understanding of instructions that were given to them at the PAT appointment. Patient was also instructed that they will need to review over the PAT instructions again at home before surgery.

## 2019-01-09 ENCOUNTER — Other Ambulatory Visit: Payer: Self-pay

## 2019-01-09 ENCOUNTER — Encounter (HOSPITAL_COMMUNITY)
Admission: RE | Admit: 2019-01-09 | Discharge: 2019-01-09 | Disposition: A | Discharge status: Home / Self Care | Payer: Medicare Other | Source: Ambulatory Visit | Attending: Orthopedic Surgery | Admitting: Orthopedic Surgery

## 2019-01-09 ENCOUNTER — Encounter (HOSPITAL_COMMUNITY): Payer: Self-pay

## 2019-01-09 DIAGNOSIS — Z01812 Encounter for preprocedural laboratory examination: Secondary | ICD-10-CM | POA: Diagnosis not present

## 2019-01-09 LAB — COMPREHENSIVE METABOLIC PANEL
ALT: 22 U/L (ref 0–44)
AST: 24 U/L (ref 15–41)
Albumin: 4.3 g/dL (ref 3.5–5.0)
Alkaline Phosphatase: 70 U/L (ref 38–126)
Anion gap: 9 (ref 5–15)
BUN: 14 mg/dL (ref 8–23)
CO2: 25 mmol/L (ref 22–32)
Calcium: 9.3 mg/dL (ref 8.9–10.3)
Chloride: 101 mmol/L (ref 98–111)
Creatinine, Ser: 0.83 mg/dL (ref 0.44–1.00)
GFR calc Af Amer: >60 mL/min (ref >60)
GFR calc non Af Amer: >60 mL/min (ref >60)
Glucose, Bld: 108 mg/dL — ABNORMAL HIGH (ref 70–99)
Potassium: 5.2 mmol/L — ABNORMAL HIGH (ref 3.5–5.1)
Sodium: 135 mmol/L (ref 135–145)
Total Bilirubin: 0.9 mg/dL (ref 0.3–1.2)
Total Protein: 7.6 g/dL (ref 6.5–8.1)

## 2019-01-09 LAB — PROTIME-INR
INR: 1 (ref 0.8–1.2)
Prothrombin Time: 13 seconds (ref 11.4–15.2)

## 2019-01-09 LAB — CBC
HCT: 43 % (ref 36.0–46.0)
Hemoglobin: 14.2 g/dL (ref 12.0–15.0)
MCH: 32.9 pg (ref 26.0–34.0)
MCHC: 33 g/dL (ref 30.0–36.0)
MCV: 99.8 fL (ref 80.0–100.0)
Platelets: 318 10*3/uL (ref 150–400)
RBC: 4.31 MIL/uL (ref 3.87–5.11)
RDW: 12.8 % (ref 11.5–15.5)
WBC: 6.3 10*3/uL (ref 4.0–10.5)
nRBC: 0 % (ref 0.0–0.2)

## 2019-01-09 LAB — APTT: aPTT: 36 seconds (ref 24–36)

## 2019-01-09 LAB — SURGICAL PCR SCREEN
MRSA, PCR: POSITIVE — AB
Staphylococcus aureus: POSITIVE — AB

## 2019-01-10 ENCOUNTER — Other Ambulatory Visit (HOSPITAL_COMMUNITY)
Admission: RE | Admit: 2019-01-10 | Discharge: 2019-01-10 | Disposition: A | Discharge status: Home / Self Care | Payer: Medicare Other | Source: Ambulatory Visit | Attending: Orthopedic Surgery | Admitting: Orthopedic Surgery

## 2019-01-10 DIAGNOSIS — Z01812 Encounter for preprocedural laboratory examination: Secondary | ICD-10-CM | POA: Insufficient documentation

## 2019-01-10 DIAGNOSIS — Z20822 Contact with and (suspected) exposure to covid-19: Secondary | ICD-10-CM | POA: Insufficient documentation

## 2019-01-12 LAB — NOVEL CORONAVIRUS, NAA (HOSP ORDER, SEND-OUT TO REF LAB; TAT 18-24 HRS): SARS-CoV-2, NAA: NOT DETECTED

## 2019-01-13 MED ORDER — BUPIVACAINE LIPOSOME 1.3 % IJ SUSP
20.0000 mL | Freq: Once | INTRAMUSCULAR | Status: DC
  Filled 2019-01-13: qty 20

## 2019-01-14 ENCOUNTER — Ambulatory Visit (HOSPITAL_COMMUNITY)
Admission: RE | Admit: 2019-01-14 | Discharge: 2019-01-14 | Disposition: A | Discharge status: Home / Self Care | Payer: Medicare Other | Source: Other Acute Inpatient Hospital | Attending: Orthopedic Surgery | Admitting: Orthopedic Surgery

## 2019-01-14 ENCOUNTER — Ambulatory Visit (HOSPITAL_COMMUNITY): Payer: Medicare Other | Admitting: Anesthesiology

## 2019-01-14 ENCOUNTER — Other Ambulatory Visit: Payer: Self-pay

## 2019-01-14 ENCOUNTER — Encounter (HOSPITAL_COMMUNITY)
Admission: RE | Disposition: A | Discharge status: Home / Self Care | Payer: Self-pay | Source: Other Acute Inpatient Hospital | Attending: Orthopedic Surgery

## 2019-01-14 ENCOUNTER — Ambulatory Visit (HOSPITAL_COMMUNITY): Payer: Medicare Other | Admitting: Physician Assistant

## 2019-01-14 ENCOUNTER — Encounter (HOSPITAL_COMMUNITY): Payer: Self-pay | Admitting: Orthopedic Surgery

## 2019-01-14 DIAGNOSIS — K219 Gastro-esophageal reflux disease without esophagitis: Secondary | ICD-10-CM | POA: Diagnosis not present

## 2019-01-14 DIAGNOSIS — M1711 Unilateral primary osteoarthritis, right knee: Secondary | ICD-10-CM | POA: Diagnosis not present

## 2019-01-14 DIAGNOSIS — E669 Obesity, unspecified: Secondary | ICD-10-CM | POA: Diagnosis not present

## 2019-01-14 DIAGNOSIS — J45909 Unspecified asthma, uncomplicated: Secondary | ICD-10-CM | POA: Diagnosis not present

## 2019-01-14 DIAGNOSIS — Z79899 Other long term (current) drug therapy: Secondary | ICD-10-CM | POA: Insufficient documentation

## 2019-01-14 DIAGNOSIS — Z6826 Body mass index (BMI) 26.0-26.9, adult: Secondary | ICD-10-CM | POA: Insufficient documentation

## 2019-01-14 DIAGNOSIS — Z96652 Presence of left artificial knee joint: Secondary | ICD-10-CM | POA: Insufficient documentation

## 2019-01-14 HISTORY — PX: TOTAL KNEE ARTHROPLASTY: SHX125

## 2019-01-14 LAB — TYPE AND SCREEN
ABO/RH(D): O POS
Antibody Screen: NEGATIVE

## 2019-01-14 SURGERY — ARTHROPLASTY, KNEE, TOTAL
Anesthesia: Spinal | Site: Knee | Laterality: Right

## 2019-01-14 MED ORDER — PROPOFOL 500 MG/50ML IV EMUL
INTRAVENOUS | Status: AC
  Filled 2019-01-14: qty 50

## 2019-01-14 MED ORDER — STERILE WATER FOR IRRIGATION IR SOLN
Status: DC | PRN
  Administered 2019-01-14: 2000 mL

## 2019-01-14 MED ORDER — LACTATED RINGERS IV BOLUS: 250.0000 mL | Freq: Once | INTRAVENOUS | Status: DC

## 2019-01-14 MED ORDER — METHOCARBAMOL 500 MG PO TABS
500.0000 mg | ORAL_TABLET | Freq: Four times a day (QID) | ORAL | Status: DC | PRN
  Administered 2019-01-14: 500 mg via ORAL

## 2019-01-14 MED ORDER — PHENYLEPHRINE HCL (PRESSORS) 10 MG/ML IV SOLN
INTRAVENOUS | Status: DC | PRN
  Administered 2019-01-14 (×2): 80 ug via INTRAVENOUS
  Administered 2019-01-14: 40 ug via INTRAVENOUS
  Administered 2019-01-14: 120 ug via INTRAVENOUS
  Administered 2019-01-14: 80 ug via INTRAVENOUS

## 2019-01-14 MED ORDER — ONDANSETRON HCL 4 MG/2ML IJ SOLN: 4.0000 mg | Freq: Once | INTRAMUSCULAR | Status: DC | PRN

## 2019-01-14 MED ORDER — METHOCARBAMOL 500 MG IVPB - SIMPLE MED: 500.0000 mg | Freq: Four times a day (QID) | INTRAVENOUS | Status: DC | PRN

## 2019-01-14 MED ORDER — OXYCODONE HCL 5 MG PO TABS
5.0000 mg | ORAL_TABLET | Freq: Once | ORAL | Status: AC | PRN
  Administered 2019-01-14: 12:00:00 5 mg via ORAL

## 2019-01-14 MED ORDER — BUPIVACAINE LIPOSOME 1.3 % IJ SUSP
INTRAMUSCULAR | Status: DC | PRN
  Administered 2019-01-14: 20 mL

## 2019-01-14 MED ORDER — METHOCARBAMOL 500 MG PO TABS
ORAL_TABLET | ORAL | Status: AC
  Filled 2019-01-14: qty 1

## 2019-01-14 MED ORDER — ASPIRIN EC 325 MG PO TBEC: 325.0000 mg | DELAYED_RELEASE_TABLET | Freq: Two times a day (BID) | ORAL | 0 refills | Status: AC

## 2019-01-14 MED ORDER — OXYCODONE HCL 5 MG PO TABS
ORAL_TABLET | ORAL | Status: AC
  Filled 2019-01-14: qty 1

## 2019-01-14 MED ORDER — DEXAMETHASONE SODIUM PHOSPHATE 10 MG/ML IJ SOLN
8.0000 mg | Freq: Once | INTRAMUSCULAR | Status: AC
  Administered 2019-01-14: 10:00:00 10 mg via INTRAVENOUS

## 2019-01-14 MED ORDER — OXYCODONE HCL 5 MG/5ML PO SOLN: 5.0000 mg | Freq: Once | ORAL | Status: AC | PRN

## 2019-01-14 MED ORDER — PROPOFOL 500 MG/50ML IV EMUL
INTRAVENOUS | Status: DC | PRN
  Administered 2019-01-14: 75 ug/kg/min via INTRAVENOUS

## 2019-01-14 MED ORDER — OXYCODONE HCL 5 MG PO TABS: 5.0000 mg | ORAL_TABLET | Freq: Four times a day (QID) | ORAL | 0 refills | Status: DC | PRN

## 2019-01-14 MED ORDER — MIDAZOLAM HCL 2 MG/2ML IJ SOLN
1.0000 mg | INTRAMUSCULAR | Status: DC
  Administered 2019-01-14: 09:00:00 2 mg via INTRAVENOUS
  Filled 2019-01-14: qty 2

## 2019-01-14 MED ORDER — VANCOMYCIN HCL IN DEXTROSE 1-5 GM/200ML-% IV SOLN
1000.0000 mg | Freq: Once | INTRAVENOUS | Status: AC
  Administered 2019-01-14: 09:00:00 1000 mg via INTRAVENOUS
  Filled 2019-01-14: qty 200

## 2019-01-14 MED ORDER — ACETAMINOPHEN 500 MG PO TABS: 1000.0000 mg | ORAL_TABLET | Freq: Four times a day (QID) | ORAL | Status: DC

## 2019-01-14 MED ORDER — METOCLOPRAMIDE HCL 5 MG/ML IJ SOLN: 5.0000 mg | Freq: Three times a day (TID) | INTRAMUSCULAR | Status: DC | PRN

## 2019-01-14 MED ORDER — ROPIVACAINE HCL 7.5 MG/ML IJ SOLN
INTRAMUSCULAR | Status: DC | PRN
  Administered 2019-01-14: 20 mL via PERINEURAL

## 2019-01-14 MED ORDER — SODIUM CHLORIDE 0.9 % IV SOLN: INTRAVENOUS | Status: DC

## 2019-01-14 MED ORDER — ONDANSETRON HCL 4 MG PO TABS
4.0000 mg | ORAL_TABLET | Freq: Four times a day (QID) | ORAL | Status: DC | PRN
  Filled 2019-01-14: qty 1

## 2019-01-14 MED ORDER — GABAPENTIN 300 MG PO CAPS: ORAL_CAPSULE | ORAL | 0 refills | Status: DC

## 2019-01-14 MED ORDER — 0.9 % SODIUM CHLORIDE (POUR BTL) OPTIME
TOPICAL | Status: DC | PRN
  Administered 2019-01-14: 1000 mL

## 2019-01-14 MED ORDER — SODIUM CHLORIDE (PF) 0.9 % IJ SOLN
INTRAMUSCULAR | Status: DC | PRN
  Administered 2019-01-14: 60 mL

## 2019-01-14 MED ORDER — TRAMADOL HCL 50 MG PO TABS: 50.0000 mg | ORAL_TABLET | Freq: Four times a day (QID) | ORAL | 0 refills | Status: DC | PRN

## 2019-01-14 MED ORDER — EPHEDRINE SULFATE 50 MG/ML IJ SOLN
INTRAMUSCULAR | Status: DC | PRN
  Administered 2019-01-14 (×2): 10 mg via INTRAVENOUS
  Administered 2019-01-14: 15 mg via INTRAVENOUS

## 2019-01-14 MED ORDER — FENTANYL CITRATE (PF) 100 MCG/2ML IJ SOLN: 25.0000 ug | INTRAMUSCULAR | Status: DC | PRN

## 2019-01-14 MED ORDER — OXYCODONE HCL 5 MG PO TABS: 5.0000 mg | ORAL_TABLET | Freq: Four times a day (QID) | ORAL | Status: DC | PRN

## 2019-01-14 MED ORDER — METHOCARBAMOL 500 MG PO TABS: 500.0000 mg | ORAL_TABLET | Freq: Four times a day (QID) | ORAL | 0 refills | Status: DC | PRN

## 2019-01-14 MED ORDER — ASPIRIN EC 325 MG PO TBEC
325.0000 mg | DELAYED_RELEASE_TABLET | Freq: Two times a day (BID) | ORAL | Status: DC
  Administered 2019-01-14: 12:00:00 325 mg via ORAL
  Filled 2019-01-14 (×2): qty 1

## 2019-01-14 MED ORDER — CEFAZOLIN SODIUM-DEXTROSE 2-4 GM/100ML-% IV SOLN
2.0000 g | INTRAVENOUS | Status: AC
  Administered 2019-01-14: 2 g via INTRAVENOUS
  Filled 2019-01-14: qty 100

## 2019-01-14 MED ORDER — MEPERIDINE HCL 50 MG/ML IJ SOLN: 6.2500 mg | INTRAMUSCULAR | Status: DC | PRN

## 2019-01-14 MED ORDER — TRANEXAMIC ACID-NACL 1000-0.7 MG/100ML-% IV SOLN: 1000.0000 mg | Freq: Once | INTRAVENOUS | Status: DC

## 2019-01-14 MED ORDER — CEPHALEXIN 500 MG PO CAPS
500.0000 mg | ORAL_CAPSULE | Freq: Once | ORAL | Status: AC
  Administered 2019-01-14: 500 mg via ORAL
  Filled 2019-01-14: qty 1

## 2019-01-14 MED ORDER — METOCLOPRAMIDE HCL 5 MG PO TABS
5.0000 mg | ORAL_TABLET | Freq: Three times a day (TID) | ORAL | Status: DC | PRN
  Filled 2019-01-14: qty 2

## 2019-01-14 MED ORDER — LACTATED RINGERS IV BOLUS
250.0000 mL | Freq: Once | INTRAVENOUS | Status: AC
  Administered 2019-01-14: 12:00:00 250 mL via INTRAVENOUS

## 2019-01-14 MED ORDER — LACTATED RINGERS IV SOLN: INTRAVENOUS | Status: DC

## 2019-01-14 MED ORDER — CEFAZOLIN SODIUM-DEXTROSE 2-4 GM/100ML-% IV SOLN: 2.0000 g | Freq: Four times a day (QID) | INTRAVENOUS | Status: DC

## 2019-01-14 MED ORDER — SODIUM CHLORIDE (PF) 0.9 % IJ SOLN
INTRAMUSCULAR | Status: AC
  Filled 2019-01-14: qty 10

## 2019-01-14 MED ORDER — MEPIVACAINE HCL (PF) 2 % IJ SOLN
INTRAMUSCULAR | Status: DC | PRN
  Administered 2019-01-14: 3 mL via INTRATHECAL

## 2019-01-14 MED ORDER — TRANEXAMIC ACID-NACL 1000-0.7 MG/100ML-% IV SOLN
1000.0000 mg | INTRAVENOUS | Status: AC
  Administered 2019-01-14: 10:00:00 1000 mg via INTRAVENOUS
  Filled 2019-01-14: qty 100

## 2019-01-14 MED ORDER — ONDANSETRON HCL 4 MG/2ML IJ SOLN: 4.0000 mg | Freq: Four times a day (QID) | INTRAMUSCULAR | Status: DC | PRN

## 2019-01-14 MED ORDER — SODIUM CHLORIDE (PF) 0.9 % IJ SOLN
INTRAMUSCULAR | Status: AC
  Filled 2019-01-14: qty 50

## 2019-01-14 MED ORDER — CHLORHEXIDINE GLUCONATE 4 % EX LIQD: 60.0000 mL | Freq: Once | CUTANEOUS | Status: DC

## 2019-01-14 MED ORDER — SODIUM CHLORIDE 0.9 % IR SOLN
Status: DC | PRN
  Administered 2019-01-14: 1000 mL

## 2019-01-14 MED ORDER — LACTATED RINGERS IV BOLUS
500.0000 mL | Freq: Once | INTRAVENOUS | Status: AC
  Administered 2019-01-14: 500 mL via INTRAVENOUS

## 2019-01-14 MED ORDER — POVIDONE-IODINE 10 % EX SWAB
2.0000 "application " | Freq: Once | CUTANEOUS | Status: AC
  Administered 2019-01-14: 2 via TOPICAL

## 2019-01-14 MED ORDER — ONDANSETRON HCL 4 MG/2ML IJ SOLN
INTRAMUSCULAR | Status: DC | PRN
  Administered 2019-01-14: 4 mg via INTRAVENOUS

## 2019-01-14 MED ORDER — FENTANYL CITRATE (PF) 100 MCG/2ML IJ SOLN
INTRAMUSCULAR | Status: AC
  Filled 2019-01-14: qty 2

## 2019-01-14 MED ORDER — FENTANYL CITRATE (PF) 100 MCG/2ML IJ SOLN
50.0000 ug | INTRAMUSCULAR | Status: DC
  Administered 2019-01-14 (×2): 100 ug via INTRAVENOUS
  Filled 2019-01-14: qty 2

## 2019-01-14 MED ORDER — ACETAMINOPHEN 10 MG/ML IV SOLN
1000.0000 mg | Freq: Four times a day (QID) | INTRAVENOUS | Status: DC
  Administered 2019-01-14: 10:00:00 1000 mg via INTRAVENOUS
  Filled 2019-01-14: qty 100

## 2019-01-14 MED ORDER — PHENYLEPHRINE 40 MCG/ML (10ML) SYRINGE FOR IV PUSH (FOR BLOOD PRESSURE SUPPORT)
PREFILLED_SYRINGE | INTRAVENOUS | Status: AC
  Filled 2019-01-14: qty 10

## 2019-01-14 SURGICAL SUPPLY — 65 items
ATTUNE MED DOME PAT 32 KNEE (Knees) ×1 IMPLANT
ATTUNE PS FEM RT SZ 7 CEM KNEE (Femur) ×1 IMPLANT
ATTUNE PSRP INSR SZ7 7 KNEE (Insert) ×1 IMPLANT
BAG SPEC THK2 15X12 ZIP CLS (MISCELLANEOUS) ×1
BAG ZIPLOCK 12X15 (MISCELLANEOUS) ×2 IMPLANT
BASE TIBIA ATTUNE KNEE SYS SZ6 (Knees) IMPLANT
BLADE SAG 18X100X1.27 (BLADE) ×2 IMPLANT
BLADE SAW SGTL 11.0X1.19X90.0M (BLADE) ×2 IMPLANT
BLADE SURG SZ10 CARB STEEL (BLADE) ×4 IMPLANT
BNDG ELASTIC 6X5.8 VLCR STR LF (GAUZE/BANDAGES/DRESSINGS) ×2 IMPLANT
BOWL SMART MIX CTS (DISPOSABLE) ×2 IMPLANT
BSPLAT TIB 6 CMNT ROT PLAT STR (Knees) ×1 IMPLANT
CEMENT HV SMART SET (Cement) ×4 IMPLANT
COVER SURGICAL LIGHT HANDLE (MISCELLANEOUS) ×2 IMPLANT
COVER WAND RF STERILE (DRAPES) IMPLANT
CUFF TOURN SGL QUICK 34 (TOURNIQUET CUFF) ×2
CUFF TRNQT CYL 34X4.125X (TOURNIQUET CUFF) ×1 IMPLANT
DECANTER SPIKE VIAL GLASS SM (MISCELLANEOUS) ×2 IMPLANT
DRAPE U-SHAPE 47X51 STRL (DRAPES) ×2 IMPLANT
DRSG ADAPTIC 3X8 NADH LF (GAUZE/BANDAGES/DRESSINGS) ×2 IMPLANT
DRSG AQUACEL AG ADV 3.5X 6 (GAUZE/BANDAGES/DRESSINGS) IMPLANT
DRSG AQUACEL AG ADV 3.5X10 (GAUZE/BANDAGES/DRESSINGS) ×1 IMPLANT
DRSG PAD ABDOMINAL 8X10 ST (GAUZE/BANDAGES/DRESSINGS) ×2 IMPLANT
DURAPREP 26ML APPLICATOR (WOUND CARE) ×2 IMPLANT
ELECT REM PT RETURN 15FT ADLT (MISCELLANEOUS) ×2 IMPLANT
EVACUATOR 1/8 PVC DRAIN (DRAIN) ×2 IMPLANT
GAUZE SPONGE 2X2 8PLY STRL LF (GAUZE/BANDAGES/DRESSINGS) IMPLANT
GAUZE SPONGE 4X4 12PLY STRL (GAUZE/BANDAGES/DRESSINGS) ×2 IMPLANT
GLOVE BIO SURGEON STRL SZ7 (GLOVE) ×2 IMPLANT
GLOVE BIO SURGEON STRL SZ8 (GLOVE) ×2 IMPLANT
GLOVE BIOGEL PI IND STRL 6.5 (GLOVE) ×1 IMPLANT
GLOVE BIOGEL PI IND STRL 7.0 (GLOVE) ×1 IMPLANT
GLOVE BIOGEL PI IND STRL 8 (GLOVE) ×1 IMPLANT
GLOVE BIOGEL PI INDICATOR 6.5 (GLOVE) ×1
GLOVE BIOGEL PI INDICATOR 7.0 (GLOVE) ×1
GLOVE BIOGEL PI INDICATOR 8 (GLOVE) ×1
GLOVE SURG SS PI 6.5 STRL IVOR (GLOVE) ×2 IMPLANT
GOWN STRL REUS W/TWL LRG LVL3 (GOWN DISPOSABLE) ×6 IMPLANT
HANDPIECE INTERPULSE COAX TIP (DISPOSABLE) ×2
HOLDER FOLEY CATH W/STRAP (MISCELLANEOUS) IMPLANT
IMMOBILIZER KNEE 20 (SOFTGOODS) ×2
IMMOBILIZER KNEE 20 THIGH 36 (SOFTGOODS) ×1 IMPLANT
KIT TURNOVER KIT A (KITS) IMPLANT
MANIFOLD NEPTUNE II (INSTRUMENTS) ×2 IMPLANT
NS IRRIG 1000ML POUR BTL (IV SOLUTION) ×2 IMPLANT
PACK TOTAL KNEE CUSTOM (KITS) ×2 IMPLANT
PADDING CAST COTTON 6X4 STRL (CAST SUPPLIES) ×5 IMPLANT
PENCIL SMOKE EVACUATOR (MISCELLANEOUS) ×1 IMPLANT
PIN DRILL FIX HALF THREAD (BIT) ×1 IMPLANT
PIN STEINMAN FIXATION KNEE (PIN) ×1 IMPLANT
PROTECTOR NERVE ULNAR (MISCELLANEOUS) ×2 IMPLANT
SET HNDPC FAN SPRY TIP SCT (DISPOSABLE) ×1 IMPLANT
SPONGE GAUZE 2X2 STER 10/PKG (GAUZE/BANDAGES/DRESSINGS)
STRIP CLOSURE SKIN 1/2X4 (GAUZE/BANDAGES/DRESSINGS) ×4 IMPLANT
SUT MNCRL AB 4-0 PS2 18 (SUTURE) ×2 IMPLANT
SUT STRATAFIX 0 PDS 27 VIOLET (SUTURE) ×2
SUT VIC AB 2-0 CT1 27 (SUTURE) ×6
SUT VIC AB 2-0 CT1 TAPERPNT 27 (SUTURE) ×3 IMPLANT
SUTURE STRATFX 0 PDS 27 VIOLET (SUTURE) ×1 IMPLANT
TIBIA ATTUNE KNEE SYS BASE SZ6 (Knees) ×2 IMPLANT
TRAY CATH 16FR W/PLASTIC CATH (SET/KITS/TRAYS/PACK) ×1 IMPLANT
TRAY FOLEY MTR SLVR 16FR STAT (SET/KITS/TRAYS/PACK) ×1 IMPLANT
WATER STERILE IRR 1000ML POUR (IV SOLUTION) ×4 IMPLANT
WRAP KNEE MAXI GEL POST OP (GAUZE/BANDAGES/DRESSINGS) ×2 IMPLANT
YANKAUER SUCT BULB TIP 10FT TU (MISCELLANEOUS) ×2 IMPLANT

## 2019-01-14 NOTE — Interval H&P Note (Signed)
History and Physical Interval Note:  01/14/2019 7:10 AM  Mandy Hanson  has presented today for surgery, with the diagnosis of right knee osteoarthritis.  The various methods of treatment have been discussed with the patient and family. After consideration of risks, benefits and other options for treatment, the patient has consented to  Procedure(s) with comments: TOTAL KNEE ARTHROPLASTY (Right) - as a surgical intervention.  The patient's history has been reviewed, patient examined, no change in status, stable for surgery.  I have reviewed the patient's chart and labs.  Questions were answered to the patient's satisfaction.     Homero Fellers Elberta [place]

## 2019-01-14 NOTE — Anesthesia Procedure Notes (Signed)
Anesthesia Regional Block: Adductor canal block   Pre-Anesthetic Checklist: ,, timeout performed, Correct Patient, Correct Site, Correct Laterality, Correct Procedure, Correct Position, site marked, Risks and benefits discussed,  Surgical consent,  Pre-op evaluation,  At surgeon's request and post-op pain management  Laterality: Right  Prep: chloraprep       Needles:  Injection technique: Single-shot  Needle Type: Echogenic Stimulator Needle     Needle Length: 9cm  Needle Gauge: 21   Needle insertion depth: 7 cm   Additional Needles:   Procedures:,,,, ultrasound used (permanent image in chart),,,,  Narrative:  Start time: 01/14/2019 8:45 AM End time: 01/14/2019 8:50 AM Injection made incrementally with aspirations every 5 mL.  Performed by: Personally  Anesthesiologist: Mal Amabile, MD  Additional Notes: Timeout performed. Patient sedated. Relevant anatomy ID'd using [place]. Incremental 2-2ml injection of LA with frequent aspiration. Patient tolerated procedure well.        Right Adductor Canal Block

## 2019-01-14 NOTE — Progress Notes (Signed)
Called Dr Lequita Halt as patient's IV has come out. Patient does not need second dose of TXA IV. Dr Lequita Halt ordered keflex 500 mg PO in place of IV ancef.

## 2019-01-14 NOTE — Op Note (Addendum)
OPERATIVE REPORT-TOTAL KNEE ARTHROPLASTY   Pre-operative diagnosis- Osteoarthritis  Right knee(s)  Post-operative diagnosis- Osteoarthritis Right knee(s)  Procedure-  Right  Total Knee Arthroplasty (Depuy Attune)  Surgeon- Dione Plover. Jahnay Lantier, MD  Assistant- Ardeen Jourdain, PA-C   Anesthesia-  Adductor canal block and spinal  EBL-25 mL   Drains None  Tourniquet time-  Total Tourniquet Time Documented: Thigh (Right) - 34 minutes Total: Thigh (Right) - 34 minutes     Complications- None  Condition-PACU - hemodynamically stable.   Brief Clinical Note  Mandy Hanson is a 69 y.o. 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 She has constant pain, with activity and at rest and significant functional deficits with difficulties even with ADLs. She has had extensive non-op management including analgesics, injections of cortisone and viscosupplements, and home exercise program, but remains in significant pain with significant dysfunction.Radiographs show bone on bone arthritis patellofemoral. She presents now for right Total Knee Arthroplasty.    Procedure in detail---   The patient is brought into the operating room and positioned supine on the operating table. After successful administration of  Adductor canal block and spinal,   a tourniquet is placed high on the  Right thigh(s) and the lower extremity is prepped and draped in the usual sterile fashion. Time out is performed by the operating team and then the  Right lower extremity is wrapped in Esmarch, knee flexed and the tourniquet inflated to 300 mmHg.       A midline incision is made with a ten blade through the subcutaneous tissue to the level of the extensor mechanism. A fresh blade is used to make a medial parapatellar arthrotomy. Soft tissue over the proximal medial tibia is subperiosteally elevated to the joint line with a knife and into the semimembranosus bursa with  a Cobb elevator. Soft tissue over the proximal lateral tibia is elevated with attention being paid to avoiding the patellar tendon on the tibial tubercle. The patella is everted, knee flexed 90 degrees and the ACL and PCL are removed. Findings are severe bone on bone patellofemoral with erosion of trochlea and patella        The drill is used to create a starting hole in the distal femur and the canal is thoroughly irrigated with sterile saline to remove the fatty contents. The 5 degree Right  valgus alignment guide is placed into the femoral canal and the distal femoral cutting block is pinned to remove 9 mm off the distal femur. Resection is made with an oscillating saw.      The tibia is subluxed forward and the menisci are removed. The extramedullary alignment guide is placed referencing proximally at the medial aspect of the tibial tubercle and distally along the second metatarsal axis and tibial crest. The block is pinned to remove 52mm off the more deficient medial  side. Resection is made with an oscillating saw. Size 6is the most appropriate size for the tibia and the proximal tibia is prepared with the modular drill and keel punch for that size.      The femoral sizing guide is placed and size 7 is most appropriate. Rotation is marked off the epicondylar axis and confirmed by creating a rectangular flexion gap at 90 degrees. The size 7 cutting block is pinned in this rotation and the anterior, posterior and chamfer cuts are made with the oscillating saw. The intercondylar block is then placed and that cut is made.  Trial size 6 tibial component, trial size 7 posterior stabilized femur and a 7  mm posterior stabilized rotating platform insert trial is placed. Full extension is achieved with excellent varus/valgus and anterior/posterior balance throughout full range of motion. The patella is everted and thickness measured to be 15  mm. Free hand resection is taken to 10 mm, a 32 template is placed,  lug holes are drilled, trial patella is placed, and it tracks normally. Osteophytes are removed off the posterior femur with the trial in place. All trials are removed and the cut bone surfaces prepared with pulsatile lavage. Cement is mixed and once ready for implantation, the size 6 tibial implant, size  7 posterior stabilized femoral component, and the size 32 patella are cemented in place and the patella is held with the clamp. The trial insert is placed and the knee held in full extension. The Exparel (20 ml mixed with 60 ml saline) is injected into the extensor mechanism, posterior capsule, medial and lateral gutters and subcutaneous tissues.  All extruded cement is removed and once the cement is hard the permanent 7 mm posterior stabilized rotating platform insert is placed into the tibial tray.      The wound is copiously irrigated with saline solution and the extensor mechanism closed   with #0 Stratofix suture. The tourniquet is released for a total tourniquet time of 34  minutes. Flexion against gravity is 140 degrees and the patella tracks normally. Subcutaneous tissue is closed with 2.0 vicryl and subcuticular with running 4.0 Monocryl. The incision is cleaned and dried and steri-strips and a bulky sterile dressing are applied. The limb is placed into a knee immobilizer and the patient is awakened and transported to recovery in stable condition.      Please note that a surgical assistant was a medical necessity for this procedure in order to perform it in a safe and expeditious manner. Surgical assistant was necessary to retract the ligaments and vital neurovascular structures to prevent injury to them and also necessary for proper positioning of the limb to allow for anatomic placement of the prosthesis.   Mandy Rankin Jere Vanburen, MD    01/14/2019, 10:18 AM

## 2019-01-14 NOTE — Transfer of Care (Signed)
Immediate Anesthesia Transfer of Care Note  Patient: Mandy Hanson  Procedure(s) Performed: TOTAL KNEE ARTHROPLASTY (Right Knee)  Patient Location: PACU  Anesthesia Type:Spinal  Level of Consciousness: awake, alert  and oriented  Airway & Oxygen Therapy: Patient Spontanous Breathing and Patient connected to face mask oxygen  Post-op Assessment: Report given to RN and Post -op Vital signs reviewed and stable  Post vital signs: Reviewed and stable  Last Vitals:  Vitals Value Taken Time  BP 100/61 01/14/19 1035  Temp    Pulse 92 01/14/19 1037  Resp 15 01/14/19 1037  SpO2 100 % 01/14/19 1037  Vitals shown include unvalidated device data.  Last Pain:  Vitals:   01/14/19 0856  TempSrc:   PainSc: 0-No pain      Patients Stated Pain Goal: 0 (01/14/19 0730)  Complications: No apparent anesthesia complications

## 2019-01-14 NOTE — Anesthesia Postprocedure Evaluation (Signed)
Anesthesia Post Note  Patient: Mandy Hanson  Procedure(s) Performed: TOTAL KNEE ARTHROPLASTY (Right Knee)     Patient location during evaluation: PACU Anesthesia Type: Spinal Level of consciousness: oriented and awake and alert Pain management: pain level controlled Vital Signs Assessment: post-procedure vital signs reviewed and stable Respiratory status: spontaneous breathing, respiratory function stable and nonlabored ventilation Cardiovascular status: blood pressure returned to baseline and stable Postop Assessment: no headache, no backache, no apparent nausea or vomiting, spinal receding and patient able to bend at knees Anesthetic complications: no    Last Vitals:  Vitals:   01/14/19 1100 01/14/19 1115  BP: 119/63 125/69  Pulse: 85 82  Resp: 16 14  Temp:  (!) 36.4 C  SpO2: 97% 100%    Last Pain:  Vitals:   01/14/19 1115  TempSrc:   PainSc: 0-No pain                 Sydni Elizarraraz A.

## 2019-01-14 NOTE — Evaluation (Signed)
Physical Therapy Evaluation Patient Details Name: Mandy Hanson MRN: [id number] DOB: [birth year] Today's Date: 01/14/2019   History of Present Illness  Patient is 69 y.o. female s/p Rt TKA on 01/14/19 with PMH significant for OA, diverticulitis, GERD, recurrent URI, and Lt TKA on 02/19/18.    Clinical Impression  Mandy Hanson is a 69 y.o. female POD 0 s/p Rt TKA. Patient reports independence with mobility at baseline. Patient is now limited by functional impairments (see PT problem list below) and requires supervision for transfers and gait with RW. Patient was able to ambulate ~120 feet with RW and supervision and cues for safe walker management. Patient educated on safe sequencing for stair mobility and verbalized safe guarding position for people assisting with mobility. Patient instructed in exercises to facilitate ROM and circulation. Patient will benefit from continued skilled PT interventions to address impairments and progress towards PLOF. Patient has met mobility goals at adequate level for discharge home; will continue to follow if pt continues acute stay to progress towards Mod I goals.     Follow Up Recommendations No PT follow up    Equipment Recommendations  None recommended by PT    Recommendations for Other Services       Precautions / Restrictions Precautions Precautions: Fall Restrictions Weight Bearing Restrictions: No      Mobility  Bed Mobility Overal bed mobility: Needs Assistance Bed Mobility: Supine to Sit;Sit to Supine     Supine to sit: Supervision;HOB elevated Sit to supine: Supervision;HOB elevated   General bed mobility comments: pt required extra time, no assist, cues for use of belt for Rt LE mobility  Transfers Overall transfer level: Needs assistance Equipment used: Rolling walker (2 wheeled) Transfers: Sit to/from Stand Sit to Stand: Supervision         General transfer comment: cues for safe hand placemet, no assist required for  power up, pt steady with rising  Ambulation/Gait Ambulation/Gait assistance: Supervision Gait Distance (Feet): 120 Feet Assistive device: Rolling walker (2 wheeled) Gait Pattern/deviations: Step-to pattern;Decreased stride length;Decreased stance time - right;Decreased step length - left Gait velocity: decreased   General Gait Details: cues for safe step pattern, pt maintained safe proximity to RW throughout, no overt LOB.  Stairs Stairs: Yes Stairs assistance: Min guard Stair Management: Backwards;No rails;Step to pattern;With walker Number of Stairs: 3 General stair comments: cues for safe step pattern "up with good, down with bad" no overt LOB noted. cues for safe guarding position and assistance from person(s) helping her to get home, pt verbalized safe position and understanding.  Wheelchair Mobility    Modified Rankin (Stroke Patients Only)       Balance Overall balance assessment: Needs assistance Sitting-balance support: Feet supported Sitting balance-Leahy Scale: Good     Standing balance support: Bilateral upper extremity supported;During functional activity Standing balance-Leahy Scale: Fair                               Pertinent Vitals/Pain Pain Assessment: 0-10 Pain Score: 7  Pain Location: Rt knee Pain Descriptors / Indicators: Aching;Sore Pain Intervention(s): Limited activity within patient's tolerance;Monitored during session    [place] expects to be discharged to:: Private residence Living Arrangements: Spouse/significant other Available Help at Discharge: Family;Available 24 hours/day Type of Home: House Home Access: Stairs to enter Entrance Stairs-Rails: None Entrance Stairs-Number of Steps: 2 Home Layout: Two level;Able to live on main level with bedroom/bathroom Home Equipment: Cane - single  point;Shower seat - built in;Walker - 2 wheels;Bedside commode;Shower seat      Prior Function Level of Independence:  Independent               Hand Dominance   Dominant Hand: Right    Extremity/Trunk Assessment   Upper Extremity Assessment Upper Extremity Assessment: Overall WFL for tasks assessed    Lower Extremity Assessment Lower Extremity Assessment: RLE deficits/detail RLE Deficits / Details: good quad activation, no extensor lag with SLR RLE Sensation: WNL RLE Coordination: WNL    Cervical / Trunk Assessment Cervical / Trunk Assessment: Normal  Communication   Communication: No difficulties  Cognition Arousal/Alertness: Awake/alert Behavior During Therapy: WFL for tasks assessed/performed Overall Cognitive Status: Within Functional Limits for tasks assessed                 General Comments      Exercises Total Joint Exercises Ankle Circles/Pumps: AROM;Supine;Both;10 reps Quad Sets: AROM;5 reps;Supine;Right Short Arc Quad: AROM;5 reps;Supine;Right Heel Slides: AROM;5 reps;Supine;Right Hip ABduction/ADduction: AROM;Supine;5 reps;Right Straight Leg Raises: AROM;5 reps;Supine;Right Long [employer]: AROM;5 reps;Seated;Right Knee Flexion: AAROM;5 reps;Seated;Right   Assessment/Plan    PT Assessment Patient needs continued PT services  PT Problem List Decreased strength;Decreased balance;Decreased mobility;Decreased range of motion;Decreased activity tolerance;Decreased knowledge of use of DME       PT Treatment Interventions Functional mobility training;Patient/family education;Balance training;DME instruction;Gait training;Therapeutic activities;Therapeutic exercise;Stair training    PT Goals (Current goals can be found in the Care Plan section)  Acute Rehab PT Goals Patient Stated Goal: to get back home and walk without pain PT Goal Formulation: With patient Time For Goal Achievement: 01/21/19 Potential to Achieve Goals: Good    Frequency 7X/week   Barriers to discharge        Co-evaluation               AM-PAC PT "6 Clicks" Mobility  Outcome  Measure Help needed turning from your back to your side while in a flat bed without using bedrails?: A Little Help needed moving from lying on your back to sitting on the side of a flat bed without using bedrails?: A Little Help needed moving to and from a bed to a chair (including a wheelchair)?: A Little Help needed standing up from a chair using your arms (e.g., wheelchair or bedside chair)?: A Little Help needed to walk in hospital room?: A Little Help needed climbing 3-5 steps with a railing? : A Little 6 Click Score: 18    End of Session Equipment Utilized During Treatment: Gait belt Activity Tolerance: Patient tolerated treatment well Patient left: with call bell/phone within reach;in bed Nurse Communication: Mobility status PT Visit Diagnosis: Muscle weakness (generalized) (M62.81);Difficulty in walking, not elsewhere classified (R26.2)    Time: 1610-9604 PT Time Calculation (min) (ACUTE ONLY): 41 min   Charges:   PT Evaluation $PT Eval Low Complexity: 1 Low PT Treatments $Gait Training: 8-22 mins $Therapeutic Exercise: 8-22 mins        Verner Mould, DPT Physical Therapist with [facility] [phone]  01/14/2019 2:51 PM

## 2019-01-14 NOTE — Anesthesia Procedure Notes (Signed)
Spinal  Patient location during procedure: OR Start time: 01/14/2019 9:19 AM End time: 01/14/2019 9:21 AM Staffing Performed: anesthesiologist  Anesthesiologist: Mal Amabile, MD Preanesthetic Checklist Completed: patient identified, IV checked, site marked, risks and benefits discussed, surgical consent, monitors and equipment checked, pre-op evaluation and timeout performed Spinal Block Patient position: sitting Prep: DuraPrep and site prepped and draped Patient monitoring: heart rate, cardiac monitor, continuous pulse ox and blood pressure Approach: midline Location: L3-4 Injection technique: single-shot Needle Needle type: Pencan  Needle gauge: 24 G Needle length: 9 cm Needle insertion depth: 6 cm Assessment Sensory level: T6 Additional Notes Patient tolerated procedure well. Adequate sensory level.

## 2019-01-14 NOTE — Progress Notes (Signed)
AssistedDr. Foster with right, ultrasound guided, adductor canal block. Side rails up, monitors on throughout procedure. See vital signs in flow sheet. Tolerated Procedure well.  

## 2019-01-14 NOTE — Discharge Instructions (Addendum)
Dr. Ollen Gross Total Joint Specialist [facility] [address], [address] [place], [place] [address] [phone]    TOTAL KNEE REPLACEMENT POSTOPERATIVE DIRECTIONS    Knee Rehabilitation, Guidelines Following Surgery  Results after knee surgery are often greatly improved when you follow the exercise, range of motion and muscle strengthening exercises prescribed by your doctor. Safety measures are also important to protect the knee from further injury. If any of these exercises cause you to have increased pain or swelling in your knee joint, decrease the amount until you are comfortable again and slowly increase them. If you have problems or questions, call your caregiver or physical therapist for advice.   BLOOD CLOT PREVENTION . Take a 325 mg Aspirin two times a day for three weeks following surgery. Then take an 81 mg Aspirin once a day for three weeks. Then discontinue Aspirin. Mandy Hanson may resume your vitamins/supplements upon discharge from the hospital. . Do not take any NSAIDs (Advil, Aleve, Ibuprofen, Meloxicam, etc.) until you have discontinued the 325 mg Aspirin.  HOME CARE INSTRUCTIONS  . Remove items at home which could result in a fall. This includes throw rugs or furniture in walking pathways.   ICE to the affected knee as much as tolerated. Icing helps control swelling. If the swelling is well controlled you will be more comfortable and rehab easier. Continue to use ice on the knee for pain and swelling from surgery. You may notice swelling that will progress down to the foot and ankle. This is normal after surgery. Elevate the leg when you are not up walking on it.    Continue to use the breathing machine which will help keep your temperature down.  It is common for your temperature to cycle up and down following surgery, especially at night when you are not up moving around and exerting yourself.  The breathing machine keeps your lungs expanded and your temperature  down.  Do not place pillow under knee, focus on keeping the knee straight while resting  PAIN CONTROL Achieving adequate pain control can be challenging in the first 48-72 hours after the nerve block wears off. During this time it is best to stay ahead of the pain by taking your prescribed pain meds every 4-6 hours. Once the pain is well controlled (you will not be pain free but the goal is having it under control) you may begin slowly weaning off the medications  DIET You may resume your previous home diet once you are discharged from the hospital.  DRESSING / WOUND CARE / SHOWERING . Keep your bulky bandage on for 2 days. On the third post-operative day you may remove the Ace bandage, cotton and gauze,. There is a waterproof adhesive bandage on your skin which will stay on for 7-10 days. Once you remove this you will not need to place another bandage . You may begin showering 3 days after surgery. . Do not submerge the incision under water.  ACTIVITY For the first 5 days the key is rest and control of pain and swelling . You should rest, ice and elevate the leg for 50 minutes out of every hour. Get up and walk/stretch for 10 minutes per hour. After 5 days you can increase your activity slowly as tolerated . Walk with your walker as instructed. Use the walker until you are comfortable transitioning to a cane. Walk with the cane in the opposite hand of the operative leg. You may discontinue the cane once you are comfortable. Marland Kitchen  Avoid periods of inactivity such as sitting longer than an hour when not asleep. This helps prevent blood clots.  . Do your home exercises twice a day starting on post-operative day 3. On the days you go to physical therapy, just do the home exercises once that day. . Do not drive a car until released by your surgeon.  . Do not drive while taking narcotics.  TED HOSE STOCKINGS Wear the elastic stockings on both legs for three weeks following surgery during the day. You  may remove them at night for sleeping.  WEIGHT BEARING You may bear weight as tolerated on the operative leg.  POSTOPERATIVE CONSTIPATION PROTOCOL Constipation - defined medically as fewer than three stools per week and severe constipation as less than one stool per week.  One of the most common issues patients have following surgery is constipation.  Even if you have a regular bowel pattern at home, your normal regimen is likely to be disrupted due to multiple reasons following surgery.  Combination of anesthesia, postoperative narcotics, change in appetite and fluid intake all can affect your bowels.  In order to avoid complications following surgery, here are some recommendations in order to help you during your recovery period.  Colace (docusate) - Pick up an over-the-counter form of Colace or another stool softener and take twice a day as long as you are requiring postoperative pain medications.  Take with a full glass of water daily.  If you experience loose stools or diarrhea, hold the colace until you stool forms back up.  If your symptoms do not get better within 1 week or if they get worse, check with your doctor.  MiraLax (polyethylene glycol) - Pick up over-the-counter to have on hand.  MiraLax is a solution that will increase the amount of water in your bowels to assist with bowel movements.  Take as directed and can mix with a glass of water, juice, soda, coffee, or tea.  Take if you go more than two days without a movement. Do not use MiraLax more than once per day. Call your doctor if you are still constipated or irregular after using this medication for 7 days in a row.  If you continue to have problems with postoperative constipation, please contact the office for further assistance and recommendations.  If you experience "the worst abdominal pain ever" or develop nausea or vomiting, please contact the office immediatly for further recommendations for treatment.  ITCHING  If you  experience itching with your medications, try taking only a single pain pill, or even half a pain pill at a time.  You can also use Benadryl over the counter for itching or also to help with sleep.   MEDICATIONS See your medication summary on the "After Visit Summary" that the nursing staff will review with you prior to discharge.  You may have some home medications which will be placed on hold until you complete the course of blood thinner medication.  It is important for you to complete the blood thinner medication as prescribed by your surgeon.  Continue your approved medications as instructed at time of discharge.  PRECAUTIONS If you experience chest pain or shortness of breath - call 911 immediately for transfer to the hospital emergency department.  If you develop a fever greater that 101 F, purulent drainage from wound, increased redness or drainage from wound, foul odor from the wound/dressing, or calf pain - CONTACT YOUR SURGEON.  FOLLOW-UP APPOINTMENTS Make sure you keep all of your appointments after your operation with your surgeon and caregivers. You should call the office at the above phone number and make an appointment for approximately two weeks after the date of your surgery or on the date instructed by your surgeon outlined in the "After Visit Summary".  MAKE SURE YOU:  . Understand these instructions.  . Get help right away if you are not doing well or get worse.    Pick up stool softner and laxative for home use following surgery while on pain medications. May shower starting three days after surgery. Please use a clean towel to pat the incision dry following showers. Continue to use ice for pain and swelling after surgery. Do not use any lotions or creams on the incision until instructed by your surgeon.    Spinal Anesthesia and Epidural Anesthesia, Care After This sheet gives you information about how to care for  yourself after your procedure. Your doctor may also give you more specific instructions. If you have problems or questions, call your doctor. Follow these instructions at home: For at least 24 hours after the procedure:   Have a responsible adult stay with you. It is important to have someone help care for you until you are awake and alert.  Rest as needed.  Do not do activities where you could fall or get hurt (injured).  Do not drive.  Do not use heavy machinery.  Do not drink alcohol.  Do not take sleeping pills or medicines that make you sleepy (drowsy).  Do not make important decisions.  Do not sign legal documents.  Do not take care of children on your own. Eating and drinking  If you throw up (vomit), drink water, juice, or soup when nausea and vomiting stop.  Drink enough fluid to keep your pee (urine) pale yellow.  Make sure you do not feel like throwing up (nauseous) before you eat solid foods.  Follow the diet that your doctor recommends. General instructions  Return to your normal activities as told by your doctor. Ask your doctor what activities are safe for you.  Take over-the-counter and prescription medicines only as told by your doctor.  If you have sleep apnea, surgery and certain medicines can raise your risk for breathing problems. Follow instructions from your doctor about when to wear your sleep device. Your doctor may tell you to wear your sleep device: ? Anytime you are sleeping, including during daytime naps. ? While taking prescription pain medicines, sleeping pills, or medicines that make you sleepy.  Do not use any products that contain nicotine or tobacco. This includes cigarettes and e-cigarettes. ? If you need help quitting, ask your doctor. ? If you smoke, do not smoke by yourself. Make sure someone is nearby in case you need help.  Keep all follow-up visits as told by your doctor. This is important. Contact a doctor if:  It has been  more than one day since your procedure and you feel like throwing up.  It has been more than one day since your procedure and you throw up.  You have a rash. Get help right away if:  You have a fever.  You have a headache that lasts a long time.  You have a very bad headache.  Your vision is blurry.  You see two of a single object (double vision).  You are dizzy or light-headed.  You faint.  Your arms or legs tingle, feel weak, or get  numb.  You have trouble breathing.  You cannot pee (urinate). Summary  After the procedure, have a responsible adult stay with you at home until you are fully awake and alert.  Do not do activities that might get you injured. Do not drive, use heavy machinery, drink alcohol, or make important decisions for 24 hours after the procedure.  Take medicines as told by your doctor. Do not use products that contain nicotine or tobacco.  Get help right away if you have a fever, blurry vision, difficulty breathing or passing urine, or weakness or numbness in arms or legs. This information is not intended to replace advice given to you by your health care provider. Make sure you discuss any questions you have with your health care provider. Document Revised: 12/02/2016 Document Reviewed: 04/13/2015 Elsevier Patient Education  [place] Record Use your incentive spirometer as told by your health care provider. Your health care provider or respiratory therapist will help you set a goal. You are required to breathe in slowly and deeply. As you breathe in, the spirometer column will move up slowly and evenly toward your goal. Try to reach the goal marker set on the spirometer.  Use this form to write down your progress. Bring this form with you to your follow-up visits. My incentive spirometer goal is to reach ___2500_________________. Date   1/11/2021_________ Time _____1140_____ Amount reached  _____2500_____ Date __________ Time __________ Amount reached __________ Date __________ Time __________ Amount reached __________ Date __________ Time __________ Amount reached __________ Date __________ Time __________ Amount reached __________ Date __________ Time __________ Amount reached __________ Date __________ Time __________ Amount reached __________ Date __________ Time __________ Amount reached __________ Date __________ Time __________ Amount reached __________ Date __________ Time __________ Amount reached __________ Date __________ Time __________ Amount reached __________ Date __________ Time __________ Amount reached __________ Date __________ Time __________ Amount reached __________ Date __________ Time __________ Amount reached __________ Date __________ Time __________ Amount reached __________ Date __________ Time __________ Amount reached __________ Date __________ Time __________ Amount reached __________ Date __________ Time __________ Amount reached __________ Date __________ Time __________ Amount reached __________ Date __________ Time __________ Amount reached __________ Date __________ Time __________ Amount reached __________ Date __________ Time __________ Amount reached __________ Date __________ Time __________ Amount reached __________ Date __________ Time __________ Amount reached __________ Date __________ Time __________ Amount reached __________ Date __________ Time __________ Amount reached __________ Date __________ Time __________ Amount reached __________ Date __________ Time __________ Amount reached __________ Date __________ Time __________ Amount reached __________ Date __________ Time __________ Amount reached __________ Date __________ Time __________ Amount reached __________ Date __________ Time __________ Amount reached __________ Date __________ Time __________ Amount reached __________ Date __________ Time __________ Amount  reached __________ Date __________ Time __________ Amount reached __________ Date __________ Time __________ Amount reached __________ Date __________ Time __________ Amount reached __________ Date __________ Time __________ Amount reached __________ Date __________ Time __________ Amount reached __________ Date __________ Time __________ Amount reached __________ Date __________ Time __________ Amount reached __________ Date __________ Time __________ Amount reached __________ Date __________ Time __________ Amount reached __________ Date __________ Time __________ Amount reached __________ Date __________ Time __________ Amount reached __________ Date __________ Time __________ Amount reached __________ Date __________ Time __________ Amount reached __________ Date __________ Time __________ Amount reached __________ Date __________ Time __________ Amount reached __________ This information is not intended to replace advice given to you by your health care provider.  Make sure you discuss any questions you have with your health care provider. Document Revised: 01/08/2018 Document Reviewed: 01/08/2018 Elsevier Patient Education  2020 [employer].

## 2019-01-14 NOTE — Anesthesia Preprocedure Evaluation (Addendum)
Anesthesia Evaluation  Patient identified by MRN, date of birth, ID band Patient awake    Reviewed: Allergy & Precautions, NPO status , Patient's Chart, lab work & pertinent test results  Airway Mallampati: II  TM Distance: >3 FB Neck ROM: Full    Dental no notable dental hx. (+) Dental Advisory Given, Teeth Intact, Caps   Pulmonary asthma , Recent URI , Resolved,    Pulmonary exam normal breath sounds clear to auscultation       Cardiovascular negative cardio ROS Normal cardiovascular exam Rhythm:Regular Rate:Normal     Neuro/Psych negative neurological ROS  negative psych ROS   GI/Hepatic Neg liver ROS, GERD  Medicated and Controlled,  Endo/Other  negative endocrine ROS  Renal/GU negative Renal ROS  negative genitourinary   Musculoskeletal  (+) Arthritis , Osteoarthritis,  Right knee OA Dextroscoliosis lumbar spine   Abdominal   Peds  Hematology negative hematology ROS (+)   Anesthesia Other Findings   Reproductive/Obstetrics                            Anesthesia Physical Anesthesia Plan  ASA: II  Anesthesia Plan: Spinal   Post-op Pain Management:  Regional for Post-op pain   Induction:   PONV Risk Score and Plan: 3 and Ondansetron, Treatment may vary due to age or medical condition, Propofol infusion and Dexamethasone  Airway Management Planned: Natural Airway, Nasal Cannula and Simple Face Mask  Additional Equipment:   Intra-op Plan:   Post-operative Plan: Extubation in OR  Informed Consent: I have reviewed the patients History and Physical, chart, labs and discussed the procedure including the risks, benefits and alternatives for the proposed anesthesia with the patient or authorized representative who has indicated his/her understanding and acceptance.     Dental advisory given  Plan Discussed with: CRNA and Surgeon  Anesthesia Plan Comments:          Anesthesia Quick Evaluation

## 2019-01-15 ENCOUNTER — Encounter: Payer: Self-pay | Admitting: *Deleted

## 2019-02-28 ENCOUNTER — Encounter (INDEPENDENT_AMBULATORY_CARE_PROVIDER_SITE_OTHER): Payer: Medicare Other | Admitting: Ophthalmology

## 2019-04-12 NOTE — Patient Instructions (Addendum)
DUE TO COVID-19 ONLY ONE VISITOR IS ALLOWED TO COME WITH YOU AND STAY IN THE WAITING ROOM ONLY DURING PRE OP AND PROCEDURE DAY OF SURGERY. THE 2 VISITOR MAY VISIT WITH YOU AFTER SURGERY IN YOUR PRIVATE ROOM DURING VISITING HOURS ONLY!                Mandy Hanson    Your procedure is scheduled on: 04/17/19   Report to [facility] Main  Entrance   Report to admitting at  1:00 PM     Call this number if you have problems the morning of surgery [phone]    Remember: Do not eat food after Midnight.  You may have clear liquids until 7 am    CLEAR LIQUID DIET   Foods Allowed                                                                     Foods Excluded  Coffee and tea, regular and decaf                             liquids that you cannot  Plain Jell-O any favor except red or purple                                           see through such as: Fruit ices (not with fruit pulp)                                     milk, soups, orange juice  Iced Popsicles                                    All solid food Carbonated beverages, regular and diet                                    Cranberry, grape and apple juices Sports drinks like Gatorade Lightly seasoned clear broth or consume(fat free) Sugar, honey syrup       BRUSH YOUR TEETH MORNING OF SURGERY AND RINSE YOUR MOUTH OUT, NO CHEWING GUM CANDY OR MINTS.     Take these medicines the morning of surgery with A SIP OF WATER:  Use your inhalers and bring them with you to the hospital                                 You may not have any metal on your body including hair pins and              piercings  Do not wear jewelry, make-up, lotions, powders or perfumes, deodorant             Do not wear nail polish on your fingernails.  Do not shave  48 hours prior to surgery.  Do not bring valuables to the hospital. Mandy Hanson.  Contacts, dentures or bridgework may not  be worn into surgery.      Patients discharged the day of surgery will not be allowed to drive home.  IF YOU ARE HAVING SURGERY AND GOING HOME THE SAME DAY, YOU MUST HAVE AN ADULT TO DRIVE YOU HOME AND BE WITH YOU FOR 24 HOURS. YOU MAY GO HOME BY TAXI OR UBER OR ORTHERWISE, BUT AN ADULT MUST ACCOMPANY YOU HOME AND STAY WITH YOU FOR 24 HOURS.  Name and phone number of your driver:  Special Instructions: N/A              Please read over the following fact sheets you were given: _____________________________________________________________________             [facility] - Preparing for Surgery Before surgery, you can play an important role.  Because skin is not sterile, your skin needs to be as free of germs as possible.  You can reduce the number of germs on your skin by washing with CHG (chlorahexidine gluconate) soap before surgery.  CHG is an antiseptic cleaner which kills germs and bonds with the skin to continue killing germs even after washing. Please DO NOT use if you have an allergy to CHG or antibacterial soaps.  If your skin becomes reddened/irritated stop using the CHG and inform your nurse when you arrive at Short Stay. Do not shave (including legs and underarms) for at least 48 hours prior to the first CHG shower.  You may shave your face/neck. Please follow these instructions carefully:  1.  Shower with CHG Soap the night before surgery and the  morning of Surgery.  2.  If you choose to wash your hair, wash your hair first as usual with your  normal  shampoo.  3.  After you shampoo, rinse your hair and body thoroughly to remove the  shampoo.                                        4.  Use CHG as you would any other liquid soap.  You can apply chg directly  to the skin and wash                       Gently with a scrungie or clean washcloth.  5.  Apply the CHG Soap to your body ONLY FROM THE NECK DOWN.   Do not use on face/ open                           Wound or open sores.  Avoid contact with eyes, ears mouth and genitals (private parts).                       Wash face,  Genitals (private parts) with your normal soap.             6.  Wash thoroughly, paying special attention to the area where your surgery  will be performed.  7.  Thoroughly rinse your body with warm water from the neck down.  8.  DO NOT shower/wash with your normal soap after using and rinsing off  the CHG Soap.  9.  Pat yourself dry with a clean towel.            10.  Wear clean pajamas.            11.  Place clean sheets on your bed the night of your first shower and do not  sleep with pets. Day of Surgery : Do not apply any lotions/deodorants the morning of surgery.  Please wear clean clothes to the hospital/surgery center.  FAILURE TO FOLLOW THESE INSTRUCTIONS MAY RESULT IN THE CANCELLATION OF YOUR SURGERY PATIENT SIGNATURE_________________________________  NURSE SIGNATURE__________________________________  ________________________________________________________________________

## 2019-04-13 ENCOUNTER — Other Ambulatory Visit (HOSPITAL_COMMUNITY)
Admission: RE | Admit: 2019-04-13 | Discharge: 2019-04-13 | Disposition: A | Discharge status: Home / Self Care | Payer: Medicare Other | Source: Ambulatory Visit | Attending: Orthopedic Surgery | Admitting: Orthopedic Surgery

## 2019-04-13 DIAGNOSIS — Z20822 Contact with and (suspected) exposure to covid-19: Secondary | ICD-10-CM | POA: Diagnosis not present

## 2019-04-13 DIAGNOSIS — Z01812 Encounter for preprocedural laboratory examination: Secondary | ICD-10-CM | POA: Diagnosis present

## 2019-04-13 LAB — SARS CORONAVIRUS 2 (TAT 6-24 HRS): SARS Coronavirus 2: NEGATIVE

## 2019-04-14 NOTE — H&P (Signed)
Patient's anticipated LOS is less than 2 midnights, meeting these requirements: - Younger than 61 - Lives within 1 hour of care - Has a competent adult at home to recover with post-op recover - NO history of  - Chronic pain requiring opiods  - Diabetes  - Coronary Artery Disease  - Heart failure  - Heart attack  - Stroke  - DVT/VTE  - Cardiac arrhythmia  - Respiratory Failure/COPD  - Renal failure  - Anemia  - Advanced Liver disease       Mandy Hanson is an 69 y.o. female.    Chief Complaint: left shoulder pain  HPI: Pt is a 69 y.o. female complaining of left shoulder pain after a recent fall/injury. Pain had continually increased since the beginning. X-rays in the clinic show displaced left clavicle fracture.. Various options are discussed with the patient. Risks, benefits and expectations were discussed with the patient. Patient understand the risks, benefits and expectations and wishes to proceed with surgery.   PCP:  Carol Ada, MD  D/C Plans: Home  PMH: Past Medical History:  Diagnosis Date  . Asthma   . Diverticulosis   . Esophagitis   . GERD (gastroesophageal reflux disease)   . Osteoarthritis of knees, 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  . Recurrent upper respiratory infection (URI)     PSH: Past Surgical History:  Procedure Laterality Date  . ABDOMINAL HYSTERECTOMY    . ADENOIDECTOMY    . BREAST BIOPSY Right 09/24/2014  . BREAST EXCISIONAL BIOPSY Right 11/20/2014  . BREAST LUMPECTOMY WITH RADIOACTIVE SEED LOCALIZATION Right 11/20/2014   Procedure: RIGHT BREAST LUMPECTOMY WITH RADIOACTIVE SEED LOCALIZATION;  Surgeon: Autumn Messing III, MD;  Location: [place];  Service: General;  Laterality: Right;  . ESOPHAGOGASTRODUODENOSCOPY  01/25/2012   Procedure: ESOPHAGOGASTRODUODENOSCOPY (EGD);  Surgeon: Missy Sabins, MD;  Location: [facility];  Service: Endoscopy;  Laterality: N/A;  . 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 TONSILLECTOMY    . TOTAL  KNEE ARTHROPLASTY Left 02/19/2018   Procedure: TOTAL KNEE ARTHROPLASTY;  Surgeon: Gaynelle Arabian, MD;  Location: [facility];  Service: Orthopedics;  Laterality: Left;  27min  . TOTAL KNEE ARTHROPLASTY Right 01/14/2019   Procedure: TOTAL KNEE ARTHROPLASTY;  Surgeon: Gaynelle Arabian, MD;  Location: [facility];  Service: Orthopedics;  Laterality: Right;  5min    Social History:  reports that she has never smoked. She has never used smokeless tobacco. She reports current alcohol use. She reports that she does not use drugs.  Allergies:  Allergies  Allergen Reactions  . Tape Itching    Normal surgical tape does not cause a problem    Medications: No current facility-administered medications for this encounter.   Current Outpatient Medications  Medication Sig Dispense Refill  . albuterol (PROVENTIL HFA;VENTOLIN HFA) 108 (90 Base) MCG/ACT inhaler Inhale 2 puffs into the lungs every 6 (six) hours as needed for wheezing or shortness of breath. 3 Inhaler 1  . azelastine (ASTELIN) 137 MCG/SPRAY nasal spray Place 2 sprays into both nostrils 2 (two) times daily.     Marland Kitchen doxycycline (PERIOSTAT) 20 MG tablet Take 20 mg by mouth daily as needed (rosacea).    . esomeprazole (NEXIUM) 20 MG capsule Take 20 mg by mouth 2 (two) times daily before a meal.     . fluticasone (FLONASE) 50 MCG/ACT nasal spray Place 2 sprays into both nostrils 2 (two) times daily.    Marland Kitchen HYDROcodone-acetaminophen (NORCO/VICODIN) 5-325 MG tablet Take 1 tablet by mouth at bedtime as  needed.    . lubiprostone (AMITIZA) 24 MCG capsule Take 24 mcg by mouth 2 (two) times daily with a meal.    . Methylcellulose, Laxative, (CITRUCEL PO) Take 2 tablets by mouth daily as needed (digestion).     . montelukast (SINGULAIR) 10 MG tablet Take 10 mg by mouth at bedtime.     . traMADol (ULTRAM) 50 MG tablet Take 1-2 tablets (50-100 mg total) by mouth every 6 (six) hours as needed for moderate pain. 40 tablet 0    Results for orders placed or performed during  the hospital encounter of 04/13/19 (from the past 48 hour(s))  SARS CORONAVIRUS 2 (TAT 6-24 HRS) Nasopharyngeal Nasopharyngeal Swab     Status: None   Collection Time: 04/13/19  1:40 PM   Specimen: Nasopharyngeal Swab  Result Value Ref Range   SARS Coronavirus 2 NEGATIVE NEGATIVE    Comment: (NOTE) SARS-CoV-2 target nucleic acids are NOT DETECTED. The SARS-CoV-2 RNA is generally detectable in upper and lower respiratory specimens during the acute phase of infection. Negative results do not preclude SARS-CoV-2 infection, do not rule out co-infections with other pathogens, and should not be used as the sole basis for treatment or other patient management decisions. Negative results must be combined with clinical observations, patient history, and epidemiological information. The expected result is Negative. Fact Sheet for Patients: HairSlick.no Fact Sheet for Healthcare Providers: quierodirigir.com This test is not yet approved or cleared by the [place] FDA and  has been authorized for detection and/or diagnosis of SARS-CoV-2 by FDA under an Emergency Use Authorization (EUA). This EUA will remain  in effect (meaning this test can be used) for the duration of the COVID-19 declaration under Section 56 4(b)(1) of the Act, 21 U.S.C. section 360bbb-3(b)(1), unless the authorization is terminated or revoked sooner. Performed at [facility], [address], [place], [place] [address]    No results found.  ROS: Pain with rom of the left upper extremity  Physical Exam: Alert and oriented 68 y.o. female in no acute distress Cranial nerves 2-12 intact Cervical spine: full rom with no tenderness, nv intact distally Chest: active breath sounds bilaterally, no wheeze rhonchi or rales Heart: regular rate and rhythm, no murmur Abd: non tender non distended with active bowel sounds Hip is stable with rom  Left clavicle with  obvious deformity and tenting of the skin due to fracture nv intact distally No signs of open injury Assessment/Plan Assessment: left displaced clavicle fracture   Plan:  Patient will undergo a left clavicle ORIF by Dr. Ranell Patrick at [facility]. Risks benefits and expectations were discussed with the patient. Patient understand risks, benefits and expectations and wishes to proceed. Preoperative templating of the joint replacement has been completed, documented, and submitted to the Operating Room personnel in order to optimize intra-operative equipment management.   Alphonsa Overall PA-C, MPAS [facility] is now [employer] [address], [address], [place], [place] [address] Phone: [phone] [email or web address] Facebook  [employer]

## 2019-04-15 ENCOUNTER — Encounter (HOSPITAL_COMMUNITY): Payer: Self-pay

## 2019-04-15 ENCOUNTER — Encounter (HOSPITAL_COMMUNITY)
Admission: RE | Admit: 2019-04-15 | Discharge: 2019-04-15 | Disposition: A | Discharge status: Home / Self Care | Payer: Medicare Other | Source: Ambulatory Visit | Attending: Orthopedic Surgery | Admitting: Orthopedic Surgery

## 2019-04-15 ENCOUNTER — Other Ambulatory Visit: Payer: Self-pay

## 2019-04-15 DIAGNOSIS — Z01812 Encounter for preprocedural laboratory examination: Secondary | ICD-10-CM | POA: Insufficient documentation

## 2019-04-15 LAB — CBC
HCT: 38.7 % (ref 36.0–46.0)
Hemoglobin: 12.7 g/dL (ref 12.0–15.0)
MCH: 31.8 pg (ref 26.0–34.0)
MCHC: 32.8 g/dL (ref 30.0–36.0)
MCV: 97 fL (ref 80.0–100.0)
Platelets: 337 10*3/uL (ref 150–400)
RBC: 3.99 MIL/uL (ref 3.87–5.11)
RDW: 12.9 % (ref 11.5–15.5)
WBC: 5.8 10*3/uL (ref 4.0–10.5)
nRBC: 0 % (ref 0.0–0.2)

## 2019-04-15 NOTE — Progress Notes (Signed)
PCP - Dr. Erby Pian Cardiologist - none  Chest x-ray - no EKG - no Stress Test - no ECHO - no Cardiac Cath - no  Sleep Study - no CPAP -   Fasting Blood Sugar - NA Checks Blood Sugar _____ times a day  Blood Thinner Instructions:no Aspirin Instructions: Last Dose:  Anesthesia review:   Patient denies shortness of breath, fever, cough and chest pain at PAT appointment  yes Patient verbalized understanding of instructions that were given to them at the PAT appointment. Patient was also instructed that they will need to review over the PAT instructions again at home before surgery. yes

## 2019-04-16 NOTE — Anesthesia Preprocedure Evaluation (Addendum)
Anesthesia Evaluation  Patient identified by MRN, date of birth, ID band Patient awake    Reviewed: Allergy & Precautions, NPO status , Patient's Chart, lab work & pertinent test results  History of Anesthesia Complications Negative for: history of anesthetic complications  Airway Mallampati: II  TM Distance: >3 FB Neck ROM: Full    Dental no notable dental hx. (+) Dental Advisory Given   Pulmonary asthma ,    Pulmonary exam normal        Cardiovascular negative cardio ROS Normal cardiovascular exam     Neuro/Psych negative neurological ROS  negative psych ROS   GI/Hepatic Neg liver ROS, GERD  Medicated and Controlled,  Endo/Other  negative endocrine ROS  Renal/GU negative Renal ROS  negative genitourinary   Musculoskeletal  (+) Arthritis ,   Abdominal   Peds  Hematology negative hematology ROS (+)   Anesthesia Other Findings Day of surgery medications reviewed with patient.  Reproductive/Obstetrics negative OB ROS                            Anesthesia Physical Anesthesia Plan  ASA: II  Anesthesia Plan: General   Post-op Pain Management:    Induction: Intravenous  PONV Risk Score and Plan: 4 or greater and Treatment may vary due to age or medical condition, Ondansetron, Dexamethasone, Midazolam and Scopolamine patch - Pre-op  Airway Management Planned: Oral ETT  Additional Equipment: None  Intra-op Plan:   Post-operative Plan: Extubation in OR  Informed Consent: I have reviewed the patients History and Physical, chart, labs and discussed the procedure including the risks, benefits and alternatives for the proposed anesthesia with the patient or authorized representative who has indicated his/her understanding and acceptance.     Dental advisory given  Plan Discussed with: Anesthesiologist and CRNA  Anesthesia Plan Comments:        Anesthesia Quick Evaluation

## 2019-04-17 ENCOUNTER — Ambulatory Visit (HOSPITAL_COMMUNITY)
Admission: RE | Admit: 2019-04-17 | Discharge: 2019-04-17 | Disposition: A | Discharge status: Home / Self Care | Payer: Medicare Other | Attending: Orthopedic Surgery | Admitting: Orthopedic Surgery

## 2019-04-17 ENCOUNTER — Encounter (HOSPITAL_COMMUNITY)
Admission: RE | Disposition: A | Discharge status: Home / Self Care | Payer: Self-pay | Source: Home / Self Care | Attending: Orthopedic Surgery

## 2019-04-17 ENCOUNTER — Ambulatory Visit (HOSPITAL_COMMUNITY): Payer: Medicare Other | Admitting: Anesthesiology

## 2019-04-17 ENCOUNTER — Encounter (HOSPITAL_COMMUNITY): Payer: Self-pay | Admitting: Orthopedic Surgery

## 2019-04-17 ENCOUNTER — Ambulatory Visit (HOSPITAL_COMMUNITY): Payer: Medicare Other

## 2019-04-17 DIAGNOSIS — Z20822 Contact with and (suspected) exposure to covid-19: Secondary | ICD-10-CM | POA: Insufficient documentation

## 2019-04-17 DIAGNOSIS — K219 Gastro-esophageal reflux disease without esophagitis: Secondary | ICD-10-CM | POA: Diagnosis not present

## 2019-04-17 DIAGNOSIS — Z79899 Other long term (current) drug therapy: Secondary | ICD-10-CM | POA: Insufficient documentation

## 2019-04-17 DIAGNOSIS — K5790 Diverticulosis of intestine, part unspecified, without perforation or abscess without bleeding: Secondary | ICD-10-CM | POA: Diagnosis not present

## 2019-04-17 DIAGNOSIS — J45909 Unspecified asthma, uncomplicated: Secondary | ICD-10-CM | POA: Diagnosis not present

## 2019-04-17 DIAGNOSIS — W19XXXA Unspecified fall, initial encounter: Secondary | ICD-10-CM | POA: Insufficient documentation

## 2019-04-17 DIAGNOSIS — S42032A Displaced fracture of lateral end of left clavicle, initial encounter for closed fracture: Secondary | ICD-10-CM | POA: Insufficient documentation

## 2019-04-17 DIAGNOSIS — Z8781 Personal history of (healed) traumatic fracture: Secondary | ICD-10-CM

## 2019-04-17 HISTORY — PX: ORIF CLAVICULAR FRACTURE: SHX5055

## 2019-04-17 SURGERY — OPEN REDUCTION INTERNAL FIXATION (ORIF) CLAVICULAR FRACTURE
Anesthesia: General | Laterality: Left

## 2019-04-17 MED ORDER — KETOROLAC TROMETHAMINE 30 MG/ML IJ SOLN
INTRAMUSCULAR | Status: AC
  Filled 2019-04-17: qty 1

## 2019-04-17 MED ORDER — MIDAZOLAM HCL 2 MG/2ML IJ SOLN
1.0000 mg | INTRAMUSCULAR | Status: DC
  Filled 2019-04-17: qty 2

## 2019-04-17 MED ORDER — KETOROLAC TROMETHAMINE 30 MG/ML IJ SOLN
INTRAMUSCULAR | Status: DC | PRN
  Administered 2019-04-17: 15 mg via INTRAVENOUS

## 2019-04-17 MED ORDER — BUPIVACAINE-EPINEPHRINE (PF) 0.5% -1:200000 IJ SOLN
INTRAMUSCULAR | Status: AC
  Filled 2019-04-17: qty 30

## 2019-04-17 MED ORDER — FENTANYL CITRATE (PF) 100 MCG/2ML IJ SOLN
INTRAMUSCULAR | Status: DC | PRN
  Administered 2019-04-17 (×5): 50 ug via INTRAVENOUS

## 2019-04-17 MED ORDER — PROMETHAZINE HCL 25 MG/ML IJ SOLN
INTRAMUSCULAR | Status: AC
  Filled 2019-04-17: qty 1

## 2019-04-17 MED ORDER — BUPIVACAINE-EPINEPHRINE (PF) 0.5% -1:200000 IJ SOLN
INTRAMUSCULAR | Status: DC | PRN
  Administered 2019-04-17: 30 mL

## 2019-04-17 MED ORDER — FENTANYL CITRATE (PF) 100 MCG/2ML IJ SOLN
25.0000 ug | INTRAMUSCULAR | Status: DC | PRN
  Administered 2019-04-17 (×3): 50 ug via INTRAVENOUS

## 2019-04-17 MED ORDER — ACETAMINOPHEN 500 MG PO TABS
1000.0000 mg | ORAL_TABLET | Freq: Once | ORAL | Status: AC
  Administered 2019-04-17: 1000 mg via ORAL
  Filled 2019-04-17: qty 2

## 2019-04-17 MED ORDER — SUGAMMADEX SODIUM 200 MG/2ML IV SOLN
INTRAVENOUS | Status: DC | PRN
  Administered 2019-04-17: 161 mg via INTRAVENOUS

## 2019-04-17 MED ORDER — PROPOFOL 10 MG/ML IV BOLUS
INTRAVENOUS | Status: AC
  Filled 2019-04-17: qty 20

## 2019-04-17 MED ORDER — VANCOMYCIN HCL IN DEXTROSE 1-5 GM/200ML-% IV SOLN
1000.0000 mg | INTRAVENOUS | Status: AC
  Administered 2019-04-17: 1000 mg via INTRAVENOUS
  Filled 2019-04-17: qty 200

## 2019-04-17 MED ORDER — METHOCARBAMOL 500 MG IVPB - SIMPLE MED
500.0000 mg | Freq: Once | INTRAVENOUS | Status: AC
  Administered 2019-04-17: 500 mg via INTRAVENOUS
  Filled 2019-04-17: qty 50

## 2019-04-17 MED ORDER — 0.9 % SODIUM CHLORIDE (POUR BTL) OPTIME
TOPICAL | Status: DC | PRN
  Administered 2019-04-17: 1000 mL

## 2019-04-17 MED ORDER — PHENYLEPHRINE 40 MCG/ML (10ML) SYRINGE FOR IV PUSH (FOR BLOOD PRESSURE SUPPORT)
PREFILLED_SYRINGE | INTRAVENOUS | Status: DC | PRN
  Administered 2019-04-17 (×2): 120 ug via INTRAVENOUS

## 2019-04-17 MED ORDER — ROCURONIUM BROMIDE 10 MG/ML (PF) SYRINGE
PREFILLED_SYRINGE | INTRAVENOUS | Status: DC | PRN
  Administered 2019-04-17: 20 mg via INTRAVENOUS
  Administered 2019-04-17: 50 mg via INTRAVENOUS
  Administered 2019-04-17: 10 mg via INTRAVENOUS
  Administered 2019-04-17: 20 mg via INTRAVENOUS

## 2019-04-17 MED ORDER — OXYCODONE HCL 5 MG PO TABS
ORAL_TABLET | ORAL | Status: AC
  Filled 2019-04-17: qty 1

## 2019-04-17 MED ORDER — MIDAZOLAM HCL 2 MG/2ML IJ SOLN
INTRAMUSCULAR | Status: AC
  Filled 2019-04-17: qty 2

## 2019-04-17 MED ORDER — CEFAZOLIN SODIUM-DEXTROSE 2-4 GM/100ML-% IV SOLN
2.0000 g | INTRAVENOUS | Status: AC
  Administered 2019-04-17: 2 g via INTRAVENOUS
  Filled 2019-04-17: qty 100

## 2019-04-17 MED ORDER — ONDANSETRON HCL 4 MG/2ML IJ SOLN
INTRAMUSCULAR | Status: DC | PRN
  Administered 2019-04-17: 4 mg via INTRAVENOUS

## 2019-04-17 MED ORDER — LIDOCAINE 2% (20 MG/ML) 5 ML SYRINGE
INTRAMUSCULAR | Status: AC
  Filled 2019-04-17: qty 5

## 2019-04-17 MED ORDER — ROCURONIUM BROMIDE 10 MG/ML (PF) SYRINGE
PREFILLED_SYRINGE | INTRAVENOUS | Status: AC
  Filled 2019-04-17: qty 10

## 2019-04-17 MED ORDER — CELECOXIB 200 MG PO CAPS
ORAL_CAPSULE | ORAL | Status: AC
  Administered 2019-04-17: 200 mg via ORAL
  Filled 2019-04-17: qty 1

## 2019-04-17 MED ORDER — FENTANYL CITRATE (PF) 100 MCG/2ML IJ SOLN
INTRAMUSCULAR | Status: AC
  Filled 2019-04-17: qty 2

## 2019-04-17 MED ORDER — HYDROMORPHONE HCL 1 MG/ML IJ SOLN
0.2500 mg | INTRAMUSCULAR | Status: DC | PRN
  Administered 2019-04-17 (×4): 0.5 mg via INTRAVENOUS

## 2019-04-17 MED ORDER — MIDAZOLAM HCL 5 MG/5ML IJ SOLN
INTRAMUSCULAR | Status: DC | PRN
  Administered 2019-04-17: 2 mg via INTRAVENOUS

## 2019-04-17 MED ORDER — DEXAMETHASONE SODIUM PHOSPHATE 4 MG/ML IJ SOLN
INTRAMUSCULAR | Status: DC | PRN
  Administered 2019-04-17: 8 mg via INTRAVENOUS

## 2019-04-17 MED ORDER — OXYCODONE HCL 5 MG PO TABS
5.0000 mg | ORAL_TABLET | Freq: Once | ORAL | Status: AC | PRN
  Administered 2019-04-17: 5 mg via ORAL

## 2019-04-17 MED ORDER — LACTATED RINGERS IV SOLN: INTRAVENOUS | Status: DC | PRN

## 2019-04-17 MED ORDER — LACTATED RINGERS IV SOLN: INTRAVENOUS | Status: DC

## 2019-04-17 MED ORDER — HYDROMORPHONE HCL 1 MG/ML IJ SOLN
INTRAMUSCULAR | Status: AC
  Filled 2019-04-17: qty 1

## 2019-04-17 MED ORDER — METHOCARBAMOL 500 MG PO TABS: 500.0000 mg | ORAL_TABLET | Freq: Three times a day (TID) | ORAL | 1 refills | Status: DC | PRN

## 2019-04-17 MED ORDER — FENTANYL CITRATE (PF) 250 MCG/5ML IJ SOLN
INTRAMUSCULAR | Status: AC
  Filled 2019-04-17: qty 5

## 2019-04-17 MED ORDER — FENTANYL CITRATE (PF) 100 MCG/2ML IJ SOLN
50.0000 ug | INTRAMUSCULAR | Status: DC
  Filled 2019-04-17: qty 2

## 2019-04-17 MED ORDER — BUPIVACAINE HCL 0.25 % IJ SOLN
INTRAMUSCULAR | Status: AC
  Filled 2019-04-17: qty 1

## 2019-04-17 MED ORDER — HYDROCODONE-ACETAMINOPHEN 5-325 MG PO TABS: 1.0000 | ORAL_TABLET | Freq: Four times a day (QID) | ORAL | 0 refills | Status: DC | PRN

## 2019-04-17 MED ORDER — ONDANSETRON HCL 4 MG/2ML IJ SOLN
INTRAMUSCULAR | Status: AC
  Filled 2019-04-17: qty 2

## 2019-04-17 MED ORDER — TRAMADOL HCL 50 MG PO TABS: 50.0000 mg | ORAL_TABLET | Freq: Four times a day (QID) | ORAL | 0 refills | Status: AC | PRN

## 2019-04-17 MED ORDER — PROPOFOL 10 MG/ML IV BOLUS
INTRAVENOUS | Status: DC | PRN
  Administered 2019-04-17: 160 mg via INTRAVENOUS

## 2019-04-17 MED ORDER — DEXAMETHASONE SODIUM PHOSPHATE 10 MG/ML IJ SOLN
INTRAMUSCULAR | Status: AC
  Filled 2019-04-17: qty 1

## 2019-04-17 MED ORDER — LIDOCAINE 2% (20 MG/ML) 5 ML SYRINGE
INTRAMUSCULAR | Status: DC | PRN
  Administered 2019-04-17: 40 mg via INTRAVENOUS

## 2019-04-17 MED ORDER — PROMETHAZINE HCL 25 MG/ML IJ SOLN
6.2500 mg | INTRAMUSCULAR | Status: AC | PRN
  Administered 2019-04-17 (×2): 6.25 mg via INTRAVENOUS

## 2019-04-17 MED ORDER — CELECOXIB 200 MG PO CAPS: 200.0000 mg | ORAL_CAPSULE | Freq: Once | ORAL | Status: AC

## 2019-04-17 MED ORDER — OXYCODONE HCL 5 MG/5ML PO SOLN: 5.0000 mg | Freq: Once | ORAL | Status: AC | PRN

## 2019-04-17 SURGICAL SUPPLY — 63 items
BAG SPEC THK2 15X12 ZIP CLS (MISCELLANEOUS) ×1
BAG ZIPLOCK 12X15 (MISCELLANEOUS) ×2 IMPLANT
BIT DRILL 2.3 QUICK RELEASE (BIT) IMPLANT
BIT DRILL 2.8X5 QR DISP (BIT) ×1 IMPLANT
BOOTIES KNEE HIGH SLOAN (MISCELLANEOUS) ×4 IMPLANT
COVER SURGICAL LIGHT HANDLE (MISCELLANEOUS) ×2 IMPLANT
COVER WAND RF STERILE (DRAPES) IMPLANT
DECANTER SPIKE VIAL GLASS SM (MISCELLANEOUS) ×2 IMPLANT
DRAPE C-ARM 42X120 X-RAY (DRAPES) ×1 IMPLANT
DRAPE ORTHO SPLIT 77X108 STRL (DRAPES) ×4
DRAPE POUCH INSTRU U-SHP 10X18 (DRAPES) ×2 IMPLANT
DRAPE SURG 17X11 SM STRL (DRAPES) ×2 IMPLANT
DRAPE SURG ORHT 6 SPLT 77X108 (DRAPES) ×2 IMPLANT
DRAPE U-SHAPE 47X51 STRL (DRAPES) ×2 IMPLANT
DRILL 2.3 QUICK RELEASE (BIT) ×2
DRSG EMULSION OIL 3X3 NADH (GAUZE/BANDAGES/DRESSINGS) ×2 IMPLANT
DRSG PAD ABDOMINAL 8X10 ST (GAUZE/BANDAGES/DRESSINGS) ×2 IMPLANT
DURAPREP 26ML APPLICATOR (WOUND CARE) ×2 IMPLANT
ELECT REM PT RETURN 15FT ADLT (MISCELLANEOUS) ×2 IMPLANT
GAUZE SPONGE 4X4 12PLY STRL (GAUZE/BANDAGES/DRESSINGS) ×2 IMPLANT
GLOVE BIOGEL PI ORTHO PRO 7.5 (GLOVE) ×1
GLOVE BIOGEL PI ORTHO PRO SZ8 (GLOVE) ×1
GLOVE ORTHO TXT STRL SZ7.5 (GLOVE) ×2 IMPLANT
GLOVE PI ORTHO PRO STRL 7.5 (GLOVE) ×1 IMPLANT
GLOVE PI ORTHO PRO STRL SZ8 (GLOVE) ×1 IMPLANT
GLOVE SURG ORTHO 8.5 STRL (GLOVE) ×2 IMPLANT
GOWN STRL REUS W/TWL XL LVL3 (GOWN DISPOSABLE) ×4 IMPLANT
KIT BASIN OR (CUSTOM PROCEDURE TRAY) ×2 IMPLANT
KIT BIO-TENODESIS 3X8 DISP (MISCELLANEOUS) ×2
KIT INSRT BABSR STRL DISP BTN (MISCELLANEOUS) IMPLANT
KIT REPAIR AC JOINT TIGHT (Orthopedic Implant) ×1 IMPLANT
KIT TURNOVER KIT A (KITS) IMPLANT
MANIFOLD NEPTUNE II (INSTRUMENTS) ×2 IMPLANT
NDL SAFETY ECLIPSE 18X1.5 (NEEDLE) ×1 IMPLANT
NEEDLE HYPO 18GX1.5 SHARP (NEEDLE) ×2
NS IRRIG 1000ML POUR BTL (IV SOLUTION) ×2 IMPLANT
PACK SHOULDER (CUSTOM PROCEDURE TRAY) ×2 IMPLANT
PENCIL SMOKE EVACUATOR (MISCELLANEOUS) IMPLANT
PLATE J CLAVICLE LT 64 8H (Plate) IMPLANT
PLATE J CLAVICLE LT 8 HOLE (Plate) ×2 IMPLANT
PROTECTOR NERVE ULNAR (MISCELLANEOUS) ×2 IMPLANT
SCREW BONE LOCK 3.0X10MM HEXA (Screw) IMPLANT
SCREW BONE LOCK 3.0X12MM HEXA (Screw) IMPLANT
SCREW HEX LOCK 3.0X10MM (Screw) ×2 IMPLANT
SCREW HEX LOCK 3.0X12MM (Screw) ×4 IMPLANT
SCREW LOCK 12X3.5X HEXALOBE (Screw) IMPLANT
SCREW LOCKING 3.5X10MM (Screw) ×1 IMPLANT
SCREW LOCKING 3.5X12 (Screw) ×2 IMPLANT
SCREW NON LOCK 3.5X10MM (Screw) ×1 IMPLANT
SCREW NONLOCK HEX 3.5X12 (Screw) ×3 IMPLANT
SLING ARM IMMOBILIZER LRG (SOFTGOODS) ×2 IMPLANT
SPONGE LAP 4X18 RFD (DISPOSABLE) IMPLANT
STRIP CLOSURE SKIN 1/2X4 (GAUZE/BANDAGES/DRESSINGS) ×2 IMPLANT
SUCTION FRAZIER HANDLE 12FR (TUBING) ×2
SUCTION TUBE FRAZIER 12FR DISP (TUBING) IMPLANT
SUT FIBERWIRE #2 38 T-5 BLUE (SUTURE)
SUT MNCRL AB 3-0 PS2 18 (SUTURE) ×2 IMPLANT
SUT VIC AB 0 CT1 36 (SUTURE) ×2 IMPLANT
SUT VIC AB 2-0 CT1 27 (SUTURE) ×2
SUT VIC AB 2-0 CT1 TAPERPNT 27 (SUTURE) ×1 IMPLANT
SUTURE FIBERWR #2 38 T-5 BLUE (SUTURE) IMPLANT
TOWEL OR 17X26 10 PK STRL BLUE (TOWEL DISPOSABLE) ×2 IMPLANT
arthrex bio-tenodesis disp kit ×1 IMPLANT

## 2019-04-17 NOTE — Interval H&P Note (Signed)
History and Physical Interval Note:  04/17/2019 3:12 PM  Mandy Hanson  has presented today for surgery, with the diagnosis of Left clavicle fracture.  The various methods of treatment have been discussed with the patient and family. After consideration of risks, benefits and other options for treatment, the patient has consented to  Procedure(s): OPEN REDUCTION INTERNAL FIXATION (ORIF) CLAVICULAR FRACTURE (Left) as a surgical intervention.  The patient's history has been reviewed, patient examined, no change in status, stable for surgery.  I have reviewed the patient's chart and labs.  Questions were answered to the patient's satisfaction.     [name]

## 2019-04-17 NOTE — Brief Op Note (Signed)
04/17/2019  5:31 PM  PATIENT:  Mandy Hanson  69 y.o. female  PRE-OPERATIVE DIAGNOSIS:  Left comminuted and displaced distal clavicle fracture  POST-OPERATIVE DIAGNOSIS:  Same as pre-op  PROCEDURE:  Procedure(s): OPEN REDUCTION INTERNAL FIXATION (ORIF) CLAVICULAR FRACTURE (Left) Accumed distal clavicle plate supplemented with C-C Tightrope (Arthrex)  SURGEON:  Surgeon(s) and Role:    Beverely Low, MD - Primary  PHYSICIAN ASSISTANT:   ASSISTANTS: Thea Gist, PA-C   ANESTHESIA:   local and general  EBL:  50 mL   BLOOD ADMINISTERED:none  DRAINS: none   LOCAL MEDICATIONS USED:  MARCAINE     SPECIMEN:  No Specimen  DISPOSITION OF SPECIMEN:  N/A  COUNTS:  YES  TOURNIQUET:  * No tourniquets in log *  DICTATION: .Other Dictation: Dictation Number [id number]  PLAN OF CARE: Discharge to home after PACU  PATIENT DISPOSITION:  PACU - hemodynamically stable.   Delay start of Pharmacological VTE agent (>24hrs) due to surgical blood loss or risk of bleeding: not applicable

## 2019-04-17 NOTE — Discharge Instructions (Signed)
Ice to the shoulder and chest and clavicle area constantly.  Keep the incision covered and clean and dry for one week, then ok to get it wet in the shower.  Keep your arm in the sling while up and around supported well. Ok to remove when seated with a pillow under the arm supporting it.  DO NOT reach , push, or pull with the left arm.  Follow up with Dr Ranell Patrick in two weeks in the office, call [phone] for appt

## 2019-04-17 NOTE — Transfer of Care (Signed)
Immediate Anesthesia Transfer of Care Note  Patient: Mandy Hanson  Procedure(s) Performed: Procedure(s): OPEN REDUCTION INTERNAL FIXATION (ORIF) CLAVICULAR FRACTURE (Left)  Patient Location: PACU  Anesthesia Type:General  Level of Consciousness:  sedated, patient cooperative and responds to stimulation  Airway & Oxygen Therapy:Patient Spontanous Breathing and Patient connected to face mask oxgen  Post-op Assessment:  Report given to PACU RN and Post -op Vital signs reviewed and stable  Post vital signs:  Reviewed and stable  Last Vitals:  Vitals:   04/17/19 1321  BP: (!) 149/99  Pulse: 93  Resp: 18  Temp: 36.8 C  SpO2: 100%    Complications: No apparent anesthesia complications

## 2019-04-17 NOTE — Anesthesia Procedure Notes (Signed)
Procedure Name: Intubation Date/Time: 04/17/2019 3:40 PM Performed by: Niel Hummer, CRNA Pre-anesthesia Checklist: Patient identified, Emergency Drugs available, Suction available and Patient being monitored Patient Re-evaluated:Patient Re-evaluated prior to induction Oxygen Delivery Method: Circle system utilized Preoxygenation: Pre-oxygenation with 100% oxygen Induction Type: IV induction Ventilation: Mask ventilation without difficulty Laryngoscope Size: Mac and 4 Grade View: Grade II Tube type: Oral Tube size: 7.0 mm Number of attempts: 1 Airway Equipment and Method: Stylet Placement Confirmation: ETT inserted through vocal cords under direct vision,  positive ETCO2 and breath sounds checked- equal and bilateral Secured at: 22 cm Tube secured with: Tape Dental Injury: Teeth and Oropharynx as per pre-operative assessment

## 2019-04-18 NOTE — Anesthesia Postprocedure Evaluation (Signed)
Anesthesia Post Note  Patient: Mandy Hanson  Procedure(s) Performed: OPEN REDUCTION INTERNAL FIXATION (ORIF) CLAVICULAR FRACTURE (Left )     Patient location during evaluation: PACU Anesthesia Type: General Level of consciousness: awake and alert and oriented Pain management: pain level controlled Vital Signs Assessment: post-procedure vital signs reviewed and stable Respiratory status: spontaneous breathing, nonlabored ventilation and respiratory function stable Cardiovascular status: blood pressure returned to baseline Postop Assessment: no apparent nausea or vomiting Anesthetic complications: no            Kaylyn Layer

## 2019-04-18 NOTE — Op Note (Signed)
NAME: Mandy Hanson, Mandy Hanson MEDICAL RECORD [id number] ACCOUNT [id number] DATE OF BIRTH:Apr 16, 1950 FACILITY: [facility] LOCATION: [facility] PHYSICIAN:STEVEN Russ Halo, MD  OPERATIVE REPORT  DATE OF PROCEDURE:  04/17/2019  PREOPERATIVE DIAGNOSIS:  Left comminuted and displaced distal clavicle fracture.  POSTOPERATIVE DIAGNOSIS:  Left comminuted and displaced distal clavicle fracture.  PROCEDURE PERFORMED:  Open reduction internal fixation of displaced distal clavicle fracture using Acumed distal clavicle plate with Arthrex TightRope coracoclavicular supplementary fixation.  ATTENDING SURGEON:  Malon Kindle, MD  ASSISTANT:  Modesto Charon, [place], who was scrubbed during the entire procedure and necessary for satisfactory completion of surgery.  ANESTHESIA:  General anesthesia was used plus local anesthesia with Marcaine.  ESTIMATED BLOOD LOSS:  Minimal.  FLUID REPLACEMENT:  1000 mL crystalloid.  INSTRUMENT COUNTS:  Correct.  COMPLICATIONS:  No complications.  ANTIBIOTICS:  Perioperative antibiotics were given.  INDICATIONS:  The patient is a 69 year old female with a displaced distal clavicle fracture after a fall.  She presents about 10 days out from her injury with residual bruising and gross deformity, consistent with an unstable distal clavicle fracture.   Her distal fragment is very small and oblique and remains attached to the coracoclavicular ligaments.  We discussed with the patient these are difficult fractures to manage, recommending ORIF with a plate supplemented with CC fixation with a TightRope.   She agreed to this plan.  Risks and benefits of surgery discussed in detail with the patient.  Informed consent obtained.  DESCRIPTION OF PROCEDURE:  After an adequate level of anesthesia was achieved, the patient was positioned in modified beach chair position.  Left shoulder correctly identified and sterilely prepped and draped in the usual manner.  Time-out called,  verifying  correct patient, correct side.  A longitudinal incision was created over the distal clavicle using a 10 blade scalpel.  Dissection down through subcutaneous tissues using Bovie and Metzenbaum scissors.  We went straight down to the fracture  site where the periosteum was disrupted.  We did subperiosteal disruption of the medial fragment and identified the lateral fragment and the [facility] joint.  There was really almost no dorsal bone at the [facility] joint, but there was an oblique component of  undersurface bone of the clavicle attached to the CC ligaments.  We went ahead and identified the appropriate plate, which will give [place] 4 bicortical screw holes medially and then an additional hole for the TightRope and then 3 clusters screws for 3.0  screws laterally.  We positioned that using C-arm, which was draped into the field.  We then placed a total of 4 bicortical screws medially.  We then drilled a 4 mm hole for the Arthrex TightRope.  We then did a separate incision over the coracoid  process and went down through subcutaneous tissues with a scissors, identified the coracoid process by blunt dissection, freeing up soft tissues from around that.  We then removed the periosteum off the dorsal coracoid which I could easily see.  We then  split the conjoined tendon, placed a _____ elevator underneath the coracoid to protect the underlying structures and at this point, we had excellent visualization of her coracoid.  We then drilled a 4 mm down through the clavicle and then down through  the coracoid process centered on the coracoid.  We then used a FiberStick to pass a FiberWire suture and used that to shuttle the TightRope device down through the coracoid process.  I was able to palpate it as I flipped it and then we cinched the  TightRope device down with a button over the top of the plate and that gave [place] an anatomic reduction of the fracture site.  We guided that down so it was anatomic.  Once it was reduced fully, we  tied half hitches and cut the suture and then removed our  shuttle sutures.  We then placed three 3.0 screws. which were through the plate and locking screws and going into the distal portion of that distal fragment which was the undersurface.  We felt like we had decent purchase there.  Any purchase would be  good, but the strength of this is going to be the supplemental CC fixation to keep that plate and the clavicle fracture aligned appropriately.  We are going to go very slow on postoperative rehabilitation, but we irrigated thoroughly and then went ahead  and repaired the deep fascial and muscular layers with 0 Vicryl suture, followed by 2-0 Vicryl for subcutaneous closure and 4-0 Monocryl for skin.  A sterile compressive bandage applied and a shoulder sling.  The patient was transported to recovery room  in stable condition.  VN/NUANCE  D:04/17/2019 T:04/17/2019 JOB:[id number]

## 2019-04-19 ENCOUNTER — Encounter: Payer: Self-pay | Admitting: *Deleted

## 2019-07-02 ENCOUNTER — Other Ambulatory Visit: Payer: Self-pay

## 2019-07-02 ENCOUNTER — Encounter (INDEPENDENT_AMBULATORY_CARE_PROVIDER_SITE_OTHER): Payer: Medicare Other | Admitting: Ophthalmology

## 2019-07-02 DIAGNOSIS — H33302 Unspecified retinal break, left eye: Secondary | ICD-10-CM | POA: Diagnosis not present

## 2019-07-02 DIAGNOSIS — H4423 Degenerative myopia, bilateral: Secondary | ICD-10-CM

## 2019-07-02 DIAGNOSIS — H43813 Vitreous degeneration, bilateral: Secondary | ICD-10-CM

## 2019-07-04 ENCOUNTER — Other Ambulatory Visit: Payer: Self-pay | Admitting: Family Medicine

## 2019-07-04 DIAGNOSIS — Z1231 Encounter for screening mammogram for malignant neoplasm of breast: Secondary | ICD-10-CM

## 2019-09-12 ENCOUNTER — Ambulatory Visit
Admission: RE | Admit: 2019-09-12 | Discharge: 2019-09-12 | Disposition: A | Discharge status: Home / Self Care | Payer: Medicare Other | Source: Ambulatory Visit | Attending: Family Medicine | Admitting: Family Medicine

## 2019-09-12 ENCOUNTER — Other Ambulatory Visit: Payer: Self-pay

## 2019-09-12 DIAGNOSIS — Z1231 Encounter for screening mammogram for malignant neoplasm of breast: Secondary | ICD-10-CM

## 2020-07-09 ENCOUNTER — Other Ambulatory Visit: Payer: Self-pay

## 2020-07-09 ENCOUNTER — Encounter (INDEPENDENT_AMBULATORY_CARE_PROVIDER_SITE_OTHER): Payer: Medicare Other | Admitting: Ophthalmology

## 2020-07-09 DIAGNOSIS — H33302 Unspecified retinal break, left eye: Secondary | ICD-10-CM

## 2020-07-09 DIAGNOSIS — H4423 Degenerative myopia, bilateral: Secondary | ICD-10-CM

## 2020-07-09 DIAGNOSIS — H43813 Vitreous degeneration, bilateral: Secondary | ICD-10-CM

## 2020-08-24 ENCOUNTER — Other Ambulatory Visit: Payer: Self-pay | Admitting: Family Medicine

## 2020-08-24 DIAGNOSIS — Z1231 Encounter for screening mammogram for malignant neoplasm of breast: Secondary | ICD-10-CM

## 2020-08-25 ENCOUNTER — Other Ambulatory Visit: Payer: Self-pay | Admitting: Family Medicine

## 2020-08-25 DIAGNOSIS — E2839 Other primary ovarian failure: Secondary | ICD-10-CM

## 2020-10-06 ENCOUNTER — Ambulatory Visit
Admission: RE | Admit: 2020-10-06 | Discharge: 2020-10-06 | Disposition: A | Discharge status: Home / Self Care | Payer: Medicare Other | Source: Ambulatory Visit | Attending: Family Medicine | Admitting: Family Medicine

## 2020-10-06 ENCOUNTER — Other Ambulatory Visit: Payer: Self-pay

## 2020-10-06 DIAGNOSIS — Z1231 Encounter for screening mammogram for malignant neoplasm of breast: Secondary | ICD-10-CM

## 2021-02-24 ENCOUNTER — Ambulatory Visit
Admission: RE | Admit: 2021-02-24 | Discharge: 2021-02-24 | Disposition: A | Discharge status: Home / Self Care | Payer: Medicare Other | Source: Ambulatory Visit | Attending: Family Medicine | Admitting: Family Medicine

## 2021-02-24 DIAGNOSIS — E2839 Other primary ovarian failure: Secondary | ICD-10-CM

## 2021-07-09 ENCOUNTER — Encounter (INDEPENDENT_AMBULATORY_CARE_PROVIDER_SITE_OTHER): Payer: Medicare Other | Admitting: Ophthalmology

## 2021-07-09 DIAGNOSIS — H4423 Degenerative myopia, bilateral: Secondary | ICD-10-CM | POA: Diagnosis not present

## 2021-07-09 DIAGNOSIS — H43813 Vitreous degeneration, bilateral: Secondary | ICD-10-CM | POA: Diagnosis not present

## 2021-07-09 DIAGNOSIS — H33302 Unspecified retinal break, left eye: Secondary | ICD-10-CM

## 2021-07-29 ENCOUNTER — Other Ambulatory Visit: Payer: Self-pay | Admitting: Physician Assistant

## 2021-07-29 ENCOUNTER — Ambulatory Visit
Admission: RE | Admit: 2021-07-29 | Discharge: 2021-07-29 | Disposition: A | Discharge status: Home / Self Care | Payer: Medicare Other | Source: Ambulatory Visit | Attending: Physician Assistant | Admitting: Physician Assistant

## 2021-07-29 DIAGNOSIS — R0789 Other chest pain: Secondary | ICD-10-CM

## 2021-09-20 ENCOUNTER — Other Ambulatory Visit: Payer: Self-pay | Admitting: Family Medicine

## 2021-09-20 DIAGNOSIS — Z1231 Encounter for screening mammogram for malignant neoplasm of breast: Secondary | ICD-10-CM

## 2021-10-12 ENCOUNTER — Ambulatory Visit: Payer: Medicare Other

## 2021-11-05 ENCOUNTER — Ambulatory Visit
Admission: RE | Admit: 2021-11-05 | Discharge: 2021-11-05 | Disposition: A | Discharge status: Home / Self Care | Payer: Medicare Other | Source: Ambulatory Visit | Attending: Family Medicine | Admitting: Family Medicine

## 2021-11-05 DIAGNOSIS — Z1231 Encounter for screening mammogram for malignant neoplasm of breast: Secondary | ICD-10-CM

## 2022-07-12 ENCOUNTER — Encounter (INDEPENDENT_AMBULATORY_CARE_PROVIDER_SITE_OTHER): Payer: Medicare Other | Admitting: Ophthalmology

## 2022-07-28 ENCOUNTER — Encounter (INDEPENDENT_AMBULATORY_CARE_PROVIDER_SITE_OTHER): Payer: Medicare Other | Admitting: Ophthalmology

## 2022-07-28 DIAGNOSIS — H43813 Vitreous degeneration, bilateral: Secondary | ICD-10-CM

## 2022-07-28 DIAGNOSIS — H33302 Unspecified retinal break, left eye: Secondary | ICD-10-CM | POA: Diagnosis not present

## 2022-07-28 DIAGNOSIS — H4423 Degenerative myopia, bilateral: Secondary | ICD-10-CM

## 2022-09-01 ENCOUNTER — Encounter (HOSPITAL_COMMUNITY): Payer: Self-pay | Admitting: Internal Medicine

## 2022-09-01 ENCOUNTER — Inpatient Hospital Stay (HOSPITAL_COMMUNITY)
Admission: EM | Admit: 2022-09-01 | Discharge: 2022-09-06 | DRG: 481 | Disposition: A | Discharge status: Home Health | Payer: Medicare Other | Attending: Internal Medicine | Admitting: Internal Medicine

## 2022-09-01 ENCOUNTER — Emergency Department (HOSPITAL_COMMUNITY): Payer: Medicare Other

## 2022-09-01 ENCOUNTER — Encounter (HOSPITAL_COMMUNITY): Payer: Self-pay

## 2022-09-01 ENCOUNTER — Other Ambulatory Visit: Payer: Self-pay

## 2022-09-01 ENCOUNTER — Inpatient Hospital Stay (HOSPITAL_COMMUNITY): Payer: Medicare Other

## 2022-09-01 DIAGNOSIS — Z7951 Long term (current) use of inhaled steroids: Secondary | ICD-10-CM

## 2022-09-01 DIAGNOSIS — Z96653 Presence of artificial knee joint, bilateral: Secondary | ICD-10-CM | POA: Diagnosis present

## 2022-09-01 DIAGNOSIS — S72452A Displaced supracondylar fracture without intracondylar extension of lower end of left femur, initial encounter for closed fracture: Principal | ICD-10-CM | POA: Diagnosis present

## 2022-09-01 DIAGNOSIS — Y9301 Activity, walking, marching and hiking: Secondary | ICD-10-CM | POA: Diagnosis present

## 2022-09-01 DIAGNOSIS — J45909 Unspecified asthma, uncomplicated: Secondary | ICD-10-CM | POA: Diagnosis present

## 2022-09-01 DIAGNOSIS — W1830XA Fall on same level, unspecified, initial encounter: Secondary | ICD-10-CM | POA: Diagnosis present

## 2022-09-01 DIAGNOSIS — S728X2A Other fracture of left femur, initial encounter for closed fracture: Secondary | ICD-10-CM | POA: Diagnosis present

## 2022-09-01 DIAGNOSIS — Z96651 Presence of right artificial knee joint: Secondary | ICD-10-CM | POA: Diagnosis present

## 2022-09-01 DIAGNOSIS — Z79899 Other long term (current) drug therapy: Secondary | ICD-10-CM | POA: Diagnosis not present

## 2022-09-01 DIAGNOSIS — K581 Irritable bowel syndrome with constipation: Secondary | ICD-10-CM | POA: Diagnosis present

## 2022-09-01 DIAGNOSIS — D62 Acute posthemorrhagic anemia: Secondary | ICD-10-CM | POA: Diagnosis not present

## 2022-09-01 DIAGNOSIS — E871 Hypo-osmolality and hyponatremia: Secondary | ICD-10-CM | POA: Diagnosis present

## 2022-09-01 DIAGNOSIS — Z91048 Other nonmedicinal substance allergy status: Secondary | ICD-10-CM | POA: Diagnosis not present

## 2022-09-01 DIAGNOSIS — S7292XA Unspecified fracture of left femur, initial encounter for closed fracture: Principal | ICD-10-CM

## 2022-09-01 DIAGNOSIS — K219 Gastro-esophageal reflux disease without esophagitis: Secondary | ICD-10-CM | POA: Diagnosis present

## 2022-09-01 DIAGNOSIS — M9712XA Periprosthetic fracture around internal prosthetic left knee joint, initial encounter: Secondary | ICD-10-CM | POA: Diagnosis present

## 2022-09-01 DIAGNOSIS — S72401A Unspecified fracture of lower end of right femur, initial encounter for closed fracture: Secondary | ICD-10-CM | POA: Diagnosis not present

## 2022-09-01 LAB — CBC WITH DIFFERENTIAL/PLATELET
Abs Immature Granulocytes: 0.02 10*3/uL (ref 0.00–0.07)
Basophils Absolute: 0 10*3/uL (ref 0.0–0.1)
Basophils Relative: 0 %
Eosinophils Absolute: 0 10*3/uL (ref 0.0–0.5)
Eosinophils Relative: 0 %
HCT: 35.4 % — ABNORMAL LOW (ref 36.0–46.0)
Hemoglobin: 12.1 g/dL (ref 12.0–15.0)
Immature Granulocytes: 0 %
Lymphocytes Relative: 6 %
Lymphs Abs: 0.4 10*3/uL — ABNORMAL LOW (ref 0.7–4.0)
MCH: 34 pg (ref 26.0–34.0)
MCHC: 34.2 g/dL (ref 30.0–36.0)
MCV: 99.4 fL (ref 80.0–100.0)
Monocytes Absolute: 0.5 10*3/uL (ref 0.1–1.0)
Monocytes Relative: 8 %
Neutro Abs: 5 10*3/uL (ref 1.7–7.7)
Neutrophils Relative %: 86 %
Platelets: 228 10*3/uL (ref 150–400)
RBC: 3.56 MIL/uL — ABNORMAL LOW (ref 3.87–5.11)
RDW: 12.5 % (ref 11.5–15.5)
WBC: 5.9 10*3/uL (ref 4.0–10.5)
nRBC: 0 % (ref 0.0–0.2)

## 2022-09-01 LAB — BASIC METABOLIC PANEL
Anion gap: 10 (ref 5–15)
BUN: 5 mg/dL — ABNORMAL LOW (ref 8–23)
CO2: 20 mmol/L — ABNORMAL LOW (ref 22–32)
Calcium: 7.7 mg/dL — ABNORMAL LOW (ref 8.9–10.3)
Chloride: 97 mmol/L — ABNORMAL LOW (ref 98–111)
Creatinine, Ser: 0.66 mg/dL (ref 0.44–1.00)
GFR, Estimated: >60 mL/min (ref >60)
Glucose, Bld: 110 mg/dL — ABNORMAL HIGH (ref 70–99)
Potassium: 3.7 mmol/L (ref 3.5–5.1)
Sodium: 127 mmol/L — ABNORMAL LOW (ref 135–145)

## 2022-09-01 LAB — TYPE AND SCREEN
ABO/RH(D): O POS
Antibody Screen: NEGATIVE

## 2022-09-01 MED ORDER — NUTRISOURCE FIBER PO PACK: 1.0000 | PACK | Freq: Every day | ORAL | Status: DC | PRN

## 2022-09-01 MED ORDER — HYDROCODONE-ACETAMINOPHEN 5-325 MG PO TABS
1.0000 | ORAL_TABLET | ORAL | Status: DC | PRN
  Administered 2022-09-01 – 2022-09-02 (×3): 2 via ORAL
  Filled 2022-09-01 (×4): qty 2

## 2022-09-01 MED ORDER — HYDROMORPHONE HCL 1 MG/ML IJ SOLN
0.5000 mg | Freq: Once | INTRAMUSCULAR | Status: AC
  Administered 2022-09-01: 0.5 mg via INTRAVENOUS
  Filled 2022-09-01: qty 1

## 2022-09-01 MED ORDER — CEFAZOLIN SODIUM-DEXTROSE 2-4 GM/100ML-% IV SOLN
2.0000 g | INTRAVENOUS | Status: DC
  Filled 2022-09-01: qty 100

## 2022-09-01 MED ORDER — CHLORHEXIDINE GLUCONATE 4 % EX SOLN: 60.0000 mL | Freq: Once | CUTANEOUS | Status: DC

## 2022-09-01 MED ORDER — FLUTICASONE PROPIONATE 50 MCG/ACT NA SUSP
2.0000 | Freq: Two times a day (BID) | NASAL | Status: DC
  Administered 2022-09-01 – 2022-09-06 (×9): 2 via NASAL
  Filled 2022-09-01: qty 16

## 2022-09-01 MED ORDER — MOMETASONE FURO-FORMOTEROL FUM 200-5 MCG/ACT IN AERO
2.0000 | INHALATION_SPRAY | Freq: Two times a day (BID) | RESPIRATORY_TRACT | Status: DC
  Administered 2022-09-01 – 2022-09-06 (×7): 2 via RESPIRATORY_TRACT
  Filled 2022-09-01: qty 8.8

## 2022-09-01 MED ORDER — AZELASTINE HCL 0.1 % NA SOLN
1.0000 | Freq: Two times a day (BID) | NASAL | Status: DC
  Administered 2022-09-02 – 2022-09-06 (×8): 1 via NASAL
  Filled 2022-09-01: qty 30

## 2022-09-01 MED ORDER — ONDANSETRON HCL 4 MG/2ML IJ SOLN
4.0000 mg | Freq: Once | INTRAMUSCULAR | Status: AC
  Administered 2022-09-01: 4 mg via INTRAVENOUS
  Filled 2022-09-01: qty 2

## 2022-09-01 MED ORDER — HYDROCODONE-ACETAMINOPHEN 5-325 MG PO TABS
1.0000 | ORAL_TABLET | Freq: Four times a day (QID) | ORAL | Status: DC | PRN
  Administered 2022-09-01: 2 via ORAL
  Filled 2022-09-01: qty 2

## 2022-09-01 MED ORDER — HYDROMORPHONE HCL 1 MG/ML IJ SOLN
0.5000 mg | INTRAMUSCULAR | Status: DC | PRN
  Administered 2022-09-01 – 2022-09-02 (×2): 0.5 mg via INTRAVENOUS
  Filled 2022-09-01 (×2): qty 0.5

## 2022-09-01 MED ORDER — METHYLCELLULOSE (LAXATIVE) PO POWD: 2.0000 | Freq: Every day | ORAL | Status: DC | PRN

## 2022-09-01 MED ORDER — ALBUTEROL SULFATE (2.5 MG/3ML) 0.083% IN NEBU
2.5000 mg | INHALATION_SOLUTION | Freq: Four times a day (QID) | RESPIRATORY_TRACT | Status: DC | PRN
  Administered 2022-09-01 – 2022-09-02 (×2): 2.5 mg via RESPIRATORY_TRACT
  Filled 2022-09-01 (×2): qty 3

## 2022-09-01 MED ORDER — MONTELUKAST SODIUM 10 MG PO TABS
10.0000 mg | ORAL_TABLET | Freq: Every day | ORAL | Status: DC
  Administered 2022-09-01 – 2022-09-05 (×5): 10 mg via ORAL
  Filled 2022-09-01 (×5): qty 1

## 2022-09-01 MED ORDER — PANTOPRAZOLE SODIUM 40 MG PO TBEC
40.0000 mg | DELAYED_RELEASE_TABLET | Freq: Every day | ORAL | Status: DC
  Administered 2022-09-01 – 2022-09-06 (×6): 40 mg via ORAL
  Filled 2022-09-01 (×6): qty 1

## 2022-09-01 MED ORDER — LUBIPROSTONE 24 MCG PO CAPS
24.0000 ug | ORAL_CAPSULE | Freq: Two times a day (BID) | ORAL | Status: DC
  Administered 2022-09-02 – 2022-09-06 (×8): 24 ug via ORAL
  Filled 2022-09-01 (×10): qty 1

## 2022-09-01 MED ORDER — METHOCARBAMOL 1000 MG/10ML IJ SOLN: 500.0000 mg | Freq: Four times a day (QID) | INTRAVENOUS | Status: DC | PRN

## 2022-09-01 MED ORDER — SENNOSIDES-DOCUSATE SODIUM 8.6-50 MG PO TABS
1.0000 | ORAL_TABLET | Freq: Every evening | ORAL | Status: DC | PRN
  Administered 2022-09-02 – 2022-09-04 (×3): 1 via ORAL
  Filled 2022-09-01 (×3): qty 1

## 2022-09-01 MED ORDER — METHOCARBAMOL 500 MG PO TABS
500.0000 mg | ORAL_TABLET | Freq: Four times a day (QID) | ORAL | Status: DC | PRN
  Administered 2022-09-01 – 2022-09-06 (×11): 500 mg via ORAL
  Filled 2022-09-01 (×11): qty 1

## 2022-09-01 MED ORDER — POVIDONE-IODINE 10 % EX SWAB
2.0000 | Freq: Once | CUTANEOUS | Status: AC
  Administered 2022-09-02: 2 via TOPICAL

## 2022-09-01 NOTE — ED Notes (Addendum)
Tried to apply knee immobilizer, pt stated it was uncomfortable, possibly due to sizing. Ortho teach paged.

## 2022-09-01 NOTE — H&P (View-Only) (Signed)
 Reason for Consult:Left distal femur fx Referring Physician: Kristine Royal Time called: 4540 Time at bedside: 9811   Mandy Hanson is an 72 y.o. female.  HPI: [place] in [place] small puddle of water. She had immediate left knee pain and could not get up. She was brought to the ED where x-rays showed a left periprosthetic distal femur fx and orthopedic surgery was consulted. She lives at home with her husband and is retired.  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   Recurrent upper respiratory infection (URI)     Past Surgical History:  Procedure Laterality Date   ABDOMINAL HYSTERECTOMY     ADENOIDECTOMY     BREAST BIOPSY Right 09/24/2014   BREAST EXCISIONAL BIOPSY Right 11/20/2014   BREAST LUMPECTOMY WITH RADIOACTIVE SEED LOCALIZATION Right 11/20/2014   Procedure: RIGHT BREAST LUMPECTOMY WITH RADIOACTIVE SEED LOCALIZATION;  Surgeon: Chevis Pretty III, MD;  Location: [facility];  Service: General;  Laterality: Right;   ESOPHAGOGASTRODUODENOSCOPY  01/25/2012   Procedure: ESOPHAGOGASTRODUODENOSCOPY (EGD);  Surgeon: Barrie Folk, MD;  Location: [facility];  Service: Endoscopy;  Laterality: N/A;   EYE SURGERY Bilateral 2016   FOOT SURGERY  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   JOINT REPLACEMENT Bilateral 2020,2021   Knees   ORIF CLAVICULAR FRACTURE Left 04/17/2019   Procedure: OPEN REDUCTION INTERNAL FIXATION (ORIF) CLAVICULAR FRACTURE;  Surgeon: Beverely Low, MD;  [facility];  Service: Orthopedics;  Laterality: Left;   TONSILLECTOMY     TOTAL KNEE ARTHROPLASTY Left 02/19/2018   Procedure: TOTAL KNEE ARTHROPLASTY;  Surgeon: Ollen Gross, MD;  [facility];  Service: Orthopedics;  Laterality: Left;    TOTAL KNEE ARTHROPLASTY Right 01/14/2019   Procedure: TOTAL KNEE ARTHROPLASTY;  Surgeon: Ollen Gross, MD;   [facility];  Service: Orthopedics;  Laterality: Right;     No family history on file.  Social History:  reports that she has never smoked. She has never used smokeless tobacco. She reports current alcohol use. She reports that she does not use drugs.  Allergies:  Allergies  Allergen Reactions   Tape Itching    Normal surgical tape does not cause a problem    Medications: I have reviewed the patient's current medications.  Results for orders placed or performed during the hospital encounter of 09/01/22 (from the past 48 hour(s))  CBC with Differential     Status: Abnormal   Collection Time: 09/01/22  8:28 AM  Result Value Ref Range   WBC 5.9 4.0 - 10.5 K/uL   RBC 3.56 (L) 3.87 - 5.11 MIL/uL   Hemoglobin 12.1 12.0 - 15.0 g/dL   HCT 91.4 (L) 78.2 - 95.6 %   MCV 99.4 80.0 - 100.0 fL   MCH 34.0 26.0 - 34.0 pg   MCHC 34.2 30.0 - 36.0 g/dL   RDW 21.3 08.6 - 57.8 %   Platelets 228 150 - 400 K/uL   nRBC 0.0 0.0 - 0.2 %   Neutrophils Relative % 86 %   Neutro Abs 5.0 1.7 - 7.7 K/uL   Lymphocytes Relative 6 %   Lymphs Abs 0.4 (L) 0.7 - 4.0 K/uL   Monocytes Relative 8 %   Monocytes Absolute 0.5 0.1 - 1.0 K/uL   Eosinophils Relative 0 %   Eosinophils Absolute 0.0 0.0 - 0.5 K/uL  Basophils Relative 0 %   Basophils Absolute 0.0 0.0 - 0.1 K/uL   Immature Granulocytes 0 %   Abs Immature Granulocytes 0.02 0.00 - 0.07 K/uL    Comment: Performed at [facility], [address] [place], [place] [address]  Basic metabolic panel     Status: Abnormal   Collection Time: 09/01/22  8:28 AM  Result Value Ref Range   Sodium 127 (L) 135 - 145 mmol/L   Potassium 3.7 3.5 - 5.1 mmol/L   Chloride 97 (L) 98 - 111 mmol/L   CO2 20 (L) 22 - 32 mmol/L   Glucose, Bld 110 (H) 70 - 99 mg/dL    Comment: Glucose reference range applies only to samples taken after fasting for at least 8 hours.   BUN 5 (L) 8 - 23 mg/dL   Creatinine, Ser 2.44 0.44 - 1.00 mg/dL   Calcium 7.7 (L) 8.9 - 10.3  mg/dL   GFR, Estimated >01 >02 mL/min    Comment: (NOTE) Calculated using the CKD-EPI Creatinine Equation (2021)    Anion gap 10 5 - 15    Comment: Performed at [facility], [address] [place], [place] [address]  Type and screen [facility]     Status: None   Collection Time: 09/01/22  8:38 AM  Result Value Ref Range   ABO/RH(D) O POS    Antibody Screen NEG    Sample Expiration      09/04/2022,2359 Performed at [facility], [address] [place], [place] [address]     Chest Portable 1 View  Result Date: 09/01/2022 CLINICAL DATA:  Ongoing cough after diagnosis of bronchitis 1 month ago EXAM: PORTABLE CHEST 1 VIEW COMPARISON:  Chest radiograph dated 07/29/2021 FINDINGS: Normal lung volumes. No focal consolidations. No pleural effusion or pneumothorax. The heart size and mediastinal contours are within normal limits. Postsurgical changes of left clavicular fracture fixation. Hardware appears intact. IMPRESSION: No active disease. Electronically Signed   By: Agustin Cree M.D.   On: 09/01/2022 09:12   DG Femur Min 2 Views Left  Result Date: 09/01/2022 CLINICAL DATA:  Fall on a slippery dac.  Left leg pain. EXAM: LEFT FEMUR 2 VIEWS COMPARISON:  None Available. FINDINGS: There is comminuted and displaced fracture of the distal left femur just above the knee arthroplasty. The distal fracture fragment is displaced approximately 2.3 cm posteriorly and 1.4 cm medially. There is associated moderate suprapatellar knee joint effusion. No other acute fracture or dislocation. No aggressive osseous lesion. Mild degenerative changes of left hip joint without significant joint space narrowing. There is acetabular osteophytosis. Left knee arthroplasty noted. There is near anatomic alignment. No periprosthetic lucency. No focal soft tissue swelling. No radiopaque foreign bodies. IMPRESSION: 1. Comminuted and displaced fracture of the distal left femur just above the knee  arthroplasty. Electronically Signed   By: Jules Schick M.D.   On: 09/01/2022 08:53    Review of Systems  HENT:  Negative for ear discharge, ear pain, hearing loss and tinnitus.   Eyes:  Negative for photophobia and pain.  Respiratory:  Negative for cough and shortness of breath.   Cardiovascular:  Negative for chest pain.  Gastrointestinal:  Negative for abdominal pain, nausea and vomiting.  Genitourinary:  Negative for dysuria, flank pain, frequency and urgency.  Musculoskeletal:  Positive for arthralgias (Left knee). Negative for back pain, myalgias and neck pain.  Neurological:  Negative for dizziness and headaches.  Hematological:  Does not bruise/bleed  easily.  Psychiatric/Behavioral:  The patient is not nervous/anxious.    Blood pressure 137/80, pulse 95, temperature 98.4 F (36.9 C), temperature source Oral, resp. rate 18, SpO2 100%. Physical Exam Constitutional:      General: She is not in acute distress.    Appearance: She is well-developed. She is not diaphoretic.  HENT:     Head: Normocephalic and atraumatic.  Eyes:     General: No scleral icterus.       Right eye: No discharge.        Left eye: No discharge.     Conjunctiva/sclera: Conjunctivae normal.  Cardiovascular:     Rate and Rhythm: Normal rate and regular rhythm.  Pulmonary:     Effort: Pulmonary effort is normal. No respiratory distress.  Musculoskeletal:     Cervical back: Normal range of motion.     Comments: LLE No traumatic wounds, ecchymosis, or rash  In [place] but removed, mod TTP knee  No ankle effusion  Sens DPN, SPN, TN intact  Motor EHL, ext, flex, evers 5/5  DP 2+, PT 2+, No significant edema  Skin:    General: Skin is warm and dry.  Neurological:     Mental Status: She is alert.  Psychiatric:        Mood and Affect: Mood normal.        Behavior: Behavior normal.    Assessment/Plan: Left distal femur fx -- Plan ORIF today with Dr. Carola Frost. Please keep NPO.    Freeman Caldron,  PA-C Orthopedic Surgery [phone] 09/01/2022, 9:42 AM

## 2022-09-01 NOTE — ED Triage Notes (Signed)
[facility], fx L femur. + pedal pulses, LLE in traction applied by [facility].  3mg  dilaudid given fentanyl

## 2022-09-01 NOTE — ED Notes (Signed)
Pt taken to XRay 

## 2022-09-01 NOTE — H&P (Addendum)
History and Physical    Mandy Hanson ZOX:[id number] DOB: 12-22-1950 DOA: 09/01/2022  PCP: Merri Brunette, MD  Patient coming from: [facility]  I have personally briefly reviewed patient's old medical records in [facility]  Chief Complaint: left fx fracture s/p mechanical fall  HPI: Mandy Hanson is a 72 y.o. female with medical history significant of Asthma, GERD, OA  who presents in transfer from [facility]  with diagnosis of  periprosthetic left distal femur fracture s/p  mechanical [place].  Patient current states  no sob / no chest pain / no n/v/d/ or abdominal pain but does have pain in left lower extremity s/p xrays.    ED Course:  Vitals: Afeb, BP 137/80, hr 95 rr 18  sat 100%   Xray left femur 1. Comminuted and displaced fracture of the distal left femur just above the knee arthroplasty.      TX dilauid , zofran   Review of Systems: As per HPI otherwise 10 point review of systems negative.   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   Recurrent upper respiratory infection (URI)     Past Surgical History:  Procedure Laterality Date   ABDOMINAL HYSTERECTOMY     ADENOIDECTOMY     BREAST BIOPSY Right 09/24/2014   BREAST EXCISIONAL BIOPSY Right 11/20/2014   BREAST LUMPECTOMY WITH RADIOACTIVE SEED LOCALIZATION Right 11/20/2014   Procedure: RIGHT BREAST LUMPECTOMY WITH RADIOACTIVE SEED LOCALIZATION;  Surgeon: Chevis Pretty III, MD;  Location: [facility];  Service: General;  Laterality: Right;   ESOPHAGOGASTRODUODENOSCOPY  01/25/2012   Procedure: ESOPHAGOGASTRODUODENOSCOPY (EGD);  Surgeon: Barrie Folk, MD;  Location: [facility];  Service: Endoscopy;  Laterality: N/A;   EYE SURGERY Bilateral 2016   FOOT SURGERY  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   JOINT REPLACEMENT Bilateral 2020,2021    Knees   ORIF CLAVICULAR FRACTURE Left 04/17/2019   Procedure: OPEN REDUCTION INTERNAL FIXATION (ORIF) CLAVICULAR FRACTURE;  Surgeon: Beverely Low, MD;  [facility];  Service: Orthopedics;  Laterality: Left;   TONSILLECTOMY     TOTAL KNEE ARTHROPLASTY Left 02/19/2018   Procedure: TOTAL KNEE ARTHROPLASTY;  Surgeon: Ollen Gross, MD;  [facility];  Service: Orthopedics;  Laterality: Left;    TOTAL KNEE ARTHROPLASTY Right 01/14/2019   Procedure: TOTAL KNEE ARTHROPLASTY;  Surgeon: Ollen Gross, MD;  [facility];  Service: Orthopedics;  Laterality: Right;      reports that she has never smoked. She has never used smokeless tobacco. She reports current alcohol use. She reports that she does not use drugs.  Allergies  Allergen Reactions   Tape Itching    Normal surgical tape does not cause a problem    No family history on file.  Prior to Admission medications   Medication Sig Start Date End Date Taking? Authorizing Provider  albuterol (PROVENTIL HFA;VENTOLIN HFA) 108 (90 Base) MCG/ACT inhaler Inhale 2 puffs into the lungs every 6 (six) hours as needed for wheezing or shortness of breath. 02/05/18  Yes Alfonse Spruce, MD  azelastine (ASTELIN) 137 MCG/SPRAY nasal spray Place 1 spray into both nostrils 2 (two) times daily.   Yes [provider]  esomeprazole (NEXIUM) 40 MG capsule Take 40 mg by mouth in the morning and at bedtime.   Yes [provider]  fluticasone Aleda Grana)  50 MCG/ACT nasal spray Place 2 sprays into both nostrils 2 (two) times daily.   Yes [provider]  fluticasone-salmeterol (ADVAIR HFA) 115-21 MCG/ACT inhaler Inhale 2 puffs into the lungs 2 (two) times daily. 08/16/22  Yes [provider]  lubiprostone (AMITIZA) 24 MCG capsule Take 24 mcg by mouth 2 (two) times daily with a meal.   Yes [provider]  Methylcellulose, Laxative, (CITRUCEL PO) Take 2 tablets by mouth daily as needed (digestion).     Yes [provider]  montelukast (SINGULAIR) 10 MG tablet Take 10 mg by mouth at bedtime.    Yes [provider]  TRULANCE 3 MG TABS Take 1 tablet by mouth daily. Patient not taking: Reported on 09/01/2022    [provider]    Physical Exam: Vitals:   09/01/22 0743  BP: 137/80  Pulse: 95  Resp: 18  Temp: 98.4 F (36.9 C)  TempSrc: Oral  SpO2: 100%    Constitutional: NAD, calm,  mod discomfort due to pain Vitals:   09/01/22 0743  BP: 137/80  Pulse: 95  Resp: 18  Temp: 98.4 F (36.9 C)  TempSrc: Oral  SpO2: 100%   Eyes: PERRL, lids and conjunctivae normal ENMT: Mucous membranes are dry. Posterior pharynx clear of any exudate or lesions.Normal dentition.  Neck: normal, supple, no masses, no thyromegaly Respiratory: clear to auscultation bilaterally, no wheezing, no crackles. Normal respiratory effort. No accessory muscle use.  Cardiovascular: Regular rate and rhythm, no murmurs / rubs / gallops. No extremity edema. 2+ pedal pulses.  Abdomen: no tenderness, no masses palpated. No hepatosplenomegaly. Bowel sounds positive.  Musculoskeletal: no clubbing / cyanosis. Left leg fracture in traction. Normal muscle tone.  Skin: no rashes, lesions, ulcers. No induration Neurologic: CN 2-12 grossly intact. Sensation intact, Strength 5/5 in all 4.  Psychiatric: Normal judgment and insight. Alert and oriented x 3. Normal mood.    Labs on Admission: I have personally reviewed following labs and imaging studies  CBC: No results for input(s): "WBC", "NEUTROABS", "HGB", "HCT", "MCV", "PLT" in the last 168 hours. Basic Metabolic Panel: No results for input(s): "NA", "K", "CL", "CO2", "GLUCOSE", "BUN", "CREATININE", "CALCIUM", "MG", "PHOS" in the last 168 hours. GFR: CrCl cannot be calculated (Patient's most recent lab result is older than the maximum 21 days allowed.). Liver Function Tests: No results for input(s): "AST", "ALT", "ALKPHOS", "BILITOT", "PROT",  "ALBUMIN" in the last 168 hours. No results for input(s): "LIPASE", "AMYLASE" in the last 168 hours. No results for input(s): "AMMONIA" in the last 168 hours. Coagulation Profile: No results for input(s): "INR", "PROTIME" in the last 168 hours. Cardiac Enzymes: No results for input(s): "CKTOTAL", "CKMB", "CKMBINDEX", "TROPONINI" in the last 168 hours. BNP (last 3 results) No results for input(s): "PROBNP" in the last 8760 hours. HbA1C: No results for input(s): "HGBA1C" in the last 72 hours. CBG: No results for input(s): "GLUCAP" in the last 168 hours. Lipid Profile: No results for input(s): "CHOL", "HDL", "LDLCALC", "TRIG", "CHOLHDL", "LDLDIRECT" in the last 72 hours. Thyroid Function Tests: No results for input(s): "TSH", "T4TOTAL", "FREET4", "T3FREE", "THYROIDAB" in the last 72 hours. Anemia Panel: No results for input(s): "VITAMINB12", "FOLATE", "FERRITIN", "TIBC", "IRON", "RETICCTPCT" in the last 72 hours. Urine analysis:    Component Value Date/Time   COLORURINE YELLOW 01/14/2012 1313   APPEARANCEUR TURBID (A) 01/14/2012 1313   LABSPEC 1.022 01/14/2012 1313   PHURINE 8.0 01/14/2012 1313   GLUCOSEU NEGATIVE 01/14/2012 1313   HGBUR SMALL (A) 01/14/2012 1313   BILIRUBINUR  NEGATIVE 01/14/2012 1313   KETONESUR >80 (A) 01/14/2012 1313   PROTEINUR 30 (A) 01/14/2012 1313   UROBILINOGEN 0.2 01/14/2012 1313   NITRITE NEGATIVE 01/14/2012 1313   LEUKOCYTESUR NEGATIVE 01/14/2012 1313    Radiological Exams on Admission: No results found.  EKG: Independently reviewed. EKG: nsr  no hyper acute st-twave changes  Assessment/Plan Comminuted and displaced fracture of the distal left femur just above the knee arthroplasty. -admit to tele  -place on hip fracture protocol  -ortho consult [facility] with plans for ORIF in am  -supportive with pain medications  -patient is cleared for surgery and is a low risk for low-intermediate surgery  3.9% 30 day risk of death, MI or cardiac arrest  based on Cardiac risk index for pre-operative risk   Asthma -no exacerbation noted at this time -resume advair -prn nebs  GERD -PPI    DVT prophylaxis: heparin Code Status:  full/ as discussed per patient wishes in event of cardiac arrest  Family Communication: none at bedside Disposition Plan: patient  expected to be admitted greater than 2 midnights  Consults called: Ortho [facility] Admission status: med tele   Lurline Del MD [facility]   If 7PM-7AM, please contact night-coverage [email or web address] Password [facility]  09/01/2022, 8:17 AM

## 2022-09-01 NOTE — ED Notes (Signed)
ED TO INPATIENT HANDOFF REPORT  ED Nurse Name and Phone #: Sherron Ales, paramedic [phone]  S Name/Age/Gender Mandy Hanson 72 y.o. female Room/Bed: 031C/031C  Code Status   Code Status: Full Code  Home/SNF/Other Home Patient oriented to: self, place, time, and situation Is this baseline? Yes   Triage Complete: Triage complete  Chief Complaint Other fracture of left femur, initial encounter for closed fracture [facility]) [S72.8X2A]  Triage Note [facility]. [place]. [place], fx L femur. + pedal pulses, LLE in traction applied by [facility].  3mg  dilaudid given fentanyl      Allergies Allergies  Allergen Reactions   Tape Itching    Normal surgical tape does not cause a problem    Level of Care/Admitting Diagnosis ED Disposition     ED Disposition  Admit   Condition  --   Comment  Hospital Area: [facility] [[id number]]  Level of Care: Telemetry Medical [104]  May admit patient to [facility] or [facility] if equivalent level of care is available:: No  Covid Evaluation: Asymptomatic - no recent exposure (last 10 days) testing not required  Diagnosis: Other fracture of left femur, initial encounter for closed fracture [facility]) [[id number]]  Admitting Physician: Lurline Del [[id number]]  Attending Physician: Lurline Del [[id number]]  Certification:: I certify this patient will need inpatient services for at least 2 midnights  Expected Medical Readiness: 09/05/2022          B Medical/Surgery History 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   Recurrent upper respiratory infection (URI)    Past Surgical History:  Procedure Laterality Date   ABDOMINAL HYSTERECTOMY     ADENOIDECTOMY     BREAST BIOPSY Right 09/24/2014   BREAST EXCISIONAL  BIOPSY Right 11/20/2014   BREAST LUMPECTOMY WITH RADIOACTIVE SEED LOCALIZATION Right 11/20/2014   Procedure: RIGHT BREAST LUMPECTOMY WITH RADIOACTIVE SEED LOCALIZATION;  Surgeon: Chevis Pretty III, MD;  Location: [facility];  Service: General;  Laterality: Right;   ESOPHAGOGASTRODUODENOSCOPY  01/25/2012   Procedure: ESOPHAGOGASTRODUODENOSCOPY (EGD);  Surgeon: Barrie Folk, MD;  Location: [facility];  Service: Endoscopy;  Laterality: N/A;   EYE SURGERY Bilateral 2016   FOOT SURGERY  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   JOINT REPLACEMENT Bilateral 2020,2021   Knees   ORIF CLAVICULAR FRACTURE Left 04/17/2019   Procedure: OPEN REDUCTION INTERNAL FIXATION (ORIF) CLAVICULAR FRACTURE;  Surgeon: Beverely Low, MD;  [facility];  Service: Orthopedics;  Laterality: Left;   TONSILLECTOMY     TOTAL KNEE ARTHROPLASTY Left 02/19/2018   Procedure: TOTAL KNEE ARTHROPLASTY;  Surgeon: Ollen Gross, MD;  [facility];  Service: Orthopedics;  Laterality: Left;    TOTAL KNEE ARTHROPLASTY Right 01/14/2019   Procedure: TOTAL KNEE ARTHROPLASTY;  Surgeon: Ollen Gross, MD;  [facility];  Service: Orthopedics;  Laterality: Right;      A IV Location/Drains/Wounds Patient Lines/Drains/Airways Status     Active Line/Drains/Airways     Name Placement date Placement time Site Days   Peripheral IV 09/01/22 Right Antecubital 09/01/22  0830  Antecubital  less than 1   Incision (Closed) 04/17/19 Shoulder Left 04/17/19  1724  -- 1233            Intake/Output Last 24 hours No  intake or output data in the 24 hours ending 09/01/22 0858  Labs/Imaging Results for orders placed or performed during the hospital encounter of 09/01/22 (from the past 48 hour(s))  CBC with Differential     Status: Abnormal   Collection Time: 09/01/22  8:28 AM  Result Value Ref Range   WBC 5.9 4.0 - 10.5 K/uL   RBC 3.56 (L) 3.87 - 5.11 MIL/uL   Hemoglobin 12.1 12.0 - 15.0 g/dL   HCT 28.4 (L) 13.2 - 44.0 %    MCV 99.4 80.0 - 100.0 fL   MCH 34.0 26.0 - 34.0 pg   MCHC 34.2 30.0 - 36.0 g/dL   RDW 10.2 72.5 - 36.6 %   Platelets 228 150 - 400 K/uL   nRBC 0.0 0.0 - 0.2 %   Neutrophils Relative % 86 %   Neutro Abs 5.0 1.7 - 7.7 K/uL   Lymphocytes Relative 6 %   Lymphs Abs 0.4 (L) 0.7 - 4.0 K/uL   Monocytes Relative 8 %   Monocytes Absolute 0.5 0.1 - 1.0 K/uL   Eosinophils Relative 0 %   Eosinophils Absolute 0.0 0.0 - 0.5 K/uL   Basophils Relative 0 %   Basophils Absolute 0.0 0.0 - 0.1 K/uL   Immature Granulocytes 0 %   Abs Immature Granulocytes 0.02 0.00 - 0.07 K/uL    Comment: Performed at [facility], [address] [place], [place] [address]  Type and screen [facility]     Status: None (Preliminary result)   Collection Time: 09/01/22  8:38 AM  Result Value Ref Range   ABO/RH(D) PENDING    Antibody Screen PENDING    Sample Expiration      09/04/2022,2359 Performed at [facility], [address] [place], [place] [address]    DG Femur Min 2 Views Left  Result Date: 09/01/2022 CLINICAL DATA:  Fall on a slippery dac.  Left leg pain. EXAM: LEFT FEMUR 2 VIEWS COMPARISON:  None Available. FINDINGS: There is comminuted and displaced fracture of the distal left femur just above the knee arthroplasty. The distal fracture fragment is displaced approximately 2.3 cm posteriorly and 1.4 cm medially. There is associated moderate suprapatellar knee joint effusion. No other acute fracture or dislocation. No aggressive osseous lesion. Mild degenerative changes of left hip joint without significant joint space narrowing. There is acetabular osteophytosis. Left knee arthroplasty noted. There is near anatomic alignment. No periprosthetic lucency. No focal soft tissue swelling. No radiopaque foreign bodies. IMPRESSION: 1. Comminuted and displaced fracture of the distal left femur just above the knee arthroplasty. Electronically Signed   By: Jules Schick M.D.   On: 09/01/2022  08:53    Pending Labs Unresulted Labs (From admission, onward)     Start     Ordered   09/01/22 0839  Urinalysis, Routine w reflex microscopic -Urine, Clean Catch  Once,   R       Question:  Specimen Source  Answer:  Urine, Clean Catch   09/01/22 0839   09/01/22 0709  Basic metabolic panel  Once,   STAT        09/01/22 0708            Vitals/Pain Today's Vitals   09/01/22 0739 09/01/22 0743  BP:  137/80  Pulse:  95  Resp:  18  Temp:  98.4 F (36.9 C)  TempSrc:  Oral  SpO2:  100%  PainSc: 7      Isolation Precautions No active isolations  Medications Medications  HYDROcodone-acetaminophen (NORCO/VICODIN) 5-325 MG per tablet 1-2 tablet (has no administration in time range)  HYDROmorphone (DILAUDID) injection 0.5 mg (has no administration in time range)  methocarbamol (ROBAXIN) tablet 500 mg (has no administration in time range)    Or  methocarbamol (ROBAXIN) 500 mg in dextrose 5 % 50 mL IVPB (has no administration in time range)  senna-docusate (Senokot-S) tablet 1 tablet (has no administration in time range)  HYDROmorphone (DILAUDID) injection 0.5 mg (0.5 mg Intravenous Given 09/01/22 0831)  ondansetron (ZOFRAN) injection 4 mg (4 mg Intravenous Given 09/01/22 4782)    Mobility walks     Focused Assessments    R Recommendations: See Admitting Provider Note  Report given to:   Additional Notes:

## 2022-09-01 NOTE — Progress Notes (Signed)
CCC Pre-op Review  Pre-op checklist: asked Rn to complete  NPO: yes >24 hours  Labs: has t/s, Na 127, hgb 12.1  Consent: orders released   H&P: 8/29  Vitals: stable  O2 requirements: none  MAR/PTA review: hydrocodone at 0942, dilaudid 0830, zofran at 0832  IV: not sure  Floor nurse name:  Basil Dess  Additional info:  transferred [place] [facility]    Ancef 2g pre-op Needs nasal betadine  11:43 AM Floor Rn told me that the patients surgery will be moved to tomorrow.

## 2022-09-01 NOTE — ED Notes (Signed)
Traction removed per ortho

## 2022-09-01 NOTE — ED Notes (Signed)
[facility] vitals were 129/59, 18 RR, Pulse 100, 96% spo2, Temp 98.4

## 2022-09-01 NOTE — ED Notes (Signed)
[facility] called back and stated they would come to pt room soon to assist putting knee splint on

## 2022-09-01 NOTE — Progress Notes (Signed)
Orthopedic Tech Progress Note Patient Details:  Mandy Hanson 21-Mar-1950 [id number]  Knee immobilizer initially placed by ED paramedic once she had removed traction. Ortho techs were called to adjust it.  Ortho Devices Type of Ortho Device: Knee Immobilizer Ortho Device/Splint Location: LLE Ortho Device/Splint Interventions: Ordered, Application, Adjustment   Post Interventions Patient Tolerated: Fair Instructions Provided: Care of device, Adjustment of device  Mandy Hanson Carmine Savoy 09/01/2022, 10:35 AM

## 2022-09-01 NOTE — ED Provider Notes (Signed)
[place] EMERGENCY DEPARTMENT AT [facility] Provider Note   CSN: [id number] Arrival date & time: 09/01/22  4270     History  Chief Complaint  Patient presents with   Marletta Lor    Mandy Hanson is a 72 y.o. female.  72 year old female with prior medical history as detailed below presents from [facility] as transfer.  Patient was diagnosed with distal left femur periprosthetic fracture.  Per documentation, both hospitalist service and orthopedics are aware of transfer.  Patient received here in the [facility] ED secondary to no inpatient bed availability.  On arrival the patient seems to be comfortable.  She is requesting another dose of pain medicine after transport and movement on the stretcher.  The history is provided by the patient and medical records.       Home Medications Prior to Admission medications   Medication Sig Start Date End Date Taking? Authorizing Provider  albuterol (PROVENTIL HFA;VENTOLIN HFA) 108 (90 Base) MCG/ACT inhaler Inhale 2 puffs into the lungs every 6 (six) hours as needed for wheezing or shortness of breath. 02/05/18  Yes Alfonse Spruce, MD  azelastine (ASTELIN) 137 MCG/SPRAY nasal spray Place 1 spray into both nostrils 2 (two) times daily.   Yes [provider]  esomeprazole (NEXIUM) 40 MG capsule Take 40 mg by mouth in the morning and at bedtime.   Yes [provider]  fluticasone (FLONASE) 50 MCG/ACT nasal spray Place 2 sprays into both nostrils 2 (two) times daily.   Yes [provider]  fluticasone-salmeterol (ADVAIR HFA) 115-21 MCG/ACT inhaler Inhale 2 puffs into the lungs 2 (two) times daily. 08/16/22  Yes [provider]  lubiprostone (AMITIZA) 24 MCG capsule Take 24 mcg by mouth 2 (two) times daily with a meal.   Yes [provider]  Methylcellulose, Laxative, (CITRUCEL PO) Take 2 tablets by mouth daily as needed (digestion).    Yes [provider]  montelukast  (SINGULAIR) 10 MG tablet Take 10 mg by mouth at bedtime.    Yes [provider]  TRULANCE 3 MG TABS Take 1 tablet by mouth daily. Patient not taking: Reported on 09/01/2022    [provider]      Allergies    Tape    Review of Systems   Review of Systems  All other systems reviewed and are negative.   Physical Exam Updated Vital Signs BP 137/80 (BP Location: Left Arm)   Pulse 95   Temp 98.4 F (36.9 C) (Oral)   Resp 18   SpO2 100%  Physical Exam Vitals and nursing note reviewed.  Constitutional:      General: She is not in acute distress.    Appearance: Normal appearance. She is well-developed.  HENT:     Head: Normocephalic and atraumatic.  Eyes:     Conjunctiva/sclera: Conjunctivae normal.     Pupils: Pupils are equal, round, and reactive to light.  Cardiovascular:     Rate and Rhythm: Normal rate and regular rhythm.     Heart sounds: Normal heart sounds.  Pulmonary:     Effort: Pulmonary effort is normal. No respiratory distress.     Breath sounds: Normal breath sounds.  Abdominal:     General: There is no distension.     Palpations: Abdomen is soft.     Tenderness: There is no abdominal tenderness.  Musculoskeletal:        General: No deformity. Normal range of motion.     Cervical back: Normal range  of motion and neck supple.     Comments: Left lower extremity in EMS traction splint  Distal left lower extremity NVI.  Skin:    General: Skin is warm and dry.  Neurological:     General: No focal deficit present.     Mental Status: She is alert and oriented to person, place, and time.     ED Results / Procedures / Treatments   Labs (all labs ordered are listed, but only abnormal results are displayed) Labs Reviewed  CBC WITH DIFFERENTIAL/PLATELET  BASIC METABOLIC PANEL  TYPE AND SCREEN    EKG None  Radiology No results found.  Procedures Procedures    Medications Ordered in ED Medications  HYDROmorphone (DILAUDID)  injection 0.5 mg (has no administration in time range)    ED Course/ Medical Decision Making/ A&P                                 Medical Decision Making Amount and/or Complexity of Data Reviewed Labs: ordered. Radiology: ordered.  Risk Prescription drug management. Decision regarding hospitalization.    Medical Screen Complete  This patient presented to the ED with complaint of left femur fracture.  This complaint involves an extensive number of treatment options. The initial differential diagnosis includes, but is not limited to, femur fracture  This presentation is: Acute, Self-Limited, Previously Undiagnosed, Uncertain Prognosis, and Complicated  Patient transferred from [facility] ED for orthopedic evaluation.  Patient previously known to Dr. Lovey Newcomer ([facility]).   Patient with left distal femur periprosthetic fracture after mechanical fall that occurred yesterday.  Hospitalist service made aware of case.  [facility] service paged.  Additional history obtained:  External records from outside sources obtained and reviewed including prior ED visits and prior Inpatient records.   Problem List / ED Course:  Left Femur Fracture   Reevaluation:  After the interventions noted above, I reevaluated the patient and found that they have: improved  Disposition:  After consideration of the diagnostic results and the patients response to treatment, I feel that the patent would benefit from admission.          Final Clinical Impression(s) / ED Diagnoses Final diagnoses:  Closed fracture of left femur, unspecified fracture morphology, unspecified portion of femur, initial encounter [facility])    Rx / DC Orders ED Discharge Orders     None         Wynetta Fines, MD 09/01/22 1542

## 2022-09-01 NOTE — Consult Note (Signed)
Reason for Consult:Left distal femur fx Referring Physician: Kristine Royal Time called: 4540 Time at bedside: 9811   Mandy Hanson is an 72 y.o. female.  HPI: Mandy Hanson was on a dock and slipped in a small puddle of water. She had immediate left knee pain and could not get up. She was brought to the ED where x-rays showed a left periprosthetic distal femur fx and orthopedic surgery was consulted. She lives at home with her husband and is retired.  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   Recurrent upper respiratory infection (URI)     Past Surgical History:  Procedure Laterality Date   ABDOMINAL HYSTERECTOMY     ADENOIDECTOMY     BREAST BIOPSY Right 09/24/2014   BREAST EXCISIONAL BIOPSY Right 11/20/2014   BREAST LUMPECTOMY WITH RADIOACTIVE SEED LOCALIZATION Right 11/20/2014   Procedure: RIGHT BREAST LUMPECTOMY WITH RADIOACTIVE SEED LOCALIZATION;  Surgeon: Chevis Pretty III, MD;  Location: [facility];  Service: General;  Laterality: Right;   ESOPHAGOGASTRODUODENOSCOPY  01/25/2012   Procedure: ESOPHAGOGASTRODUODENOSCOPY (EGD);  Surgeon: Barrie Folk, MD;  Location: [facility];  Service: Endoscopy;  Laterality: N/A;   EYE SURGERY Bilateral 2016   FOOT SURGERY  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   JOINT REPLACEMENT Bilateral 2020,2021   Knees   ORIF CLAVICULAR FRACTURE Left 04/17/2019   Procedure: OPEN REDUCTION INTERNAL FIXATION (ORIF) CLAVICULAR FRACTURE;  Surgeon: Beverely Low, MD;  Location: [facility];  Service: Orthopedics;  Laterality: Left;   TONSILLECTOMY     TOTAL KNEE ARTHROPLASTY Left 02/19/2018   Procedure: TOTAL KNEE ARTHROPLASTY;  Surgeon: Ollen Gross, MD;  Location: [facility];  Service: Orthopedics;  Laterality: Left;    TOTAL KNEE ARTHROPLASTY Right 01/14/2019   Procedure: TOTAL KNEE ARTHROPLASTY;  Surgeon: Ollen Gross, MD;   Location: [facility];  Service: Orthopedics;  Laterality: Right;     No family history on file.  Social History:  reports that she has never smoked. She has never used smokeless tobacco. She reports current alcohol use. She reports that she does not use drugs.  Allergies:  Allergies  Allergen Reactions   Tape Itching    Normal surgical tape does not cause a problem    Medications: I have reviewed the patient's current medications.  Results for orders placed or performed during the hospital encounter of 09/01/22 (from the past 48 hour(s))  CBC with Differential     Status: Abnormal   Collection Time: 09/01/22  8:28 AM  Result Value Ref Range   WBC 5.9 4.0 - 10.5 K/uL   RBC 3.56 (L) 3.87 - 5.11 MIL/uL   Hemoglobin 12.1 12.0 - 15.0 g/dL   HCT 91.4 (L) 78.2 - 95.6 %   MCV 99.4 80.0 - 100.0 fL   MCH 34.0 26.0 - 34.0 pg   MCHC 34.2 30.0 - 36.0 g/dL   RDW 21.3 08.6 - 57.8 %   Platelets 228 150 - 400 K/uL   nRBC 0.0 0.0 - 0.2 %   Neutrophils Relative % 86 %   Neutro Abs 5.0 1.7 - 7.7 K/uL   Lymphocytes Relative 6 %   Lymphs Abs 0.4 (L) 0.7 - 4.0 K/uL   Monocytes Relative 8 %   Monocytes Absolute 0.5 0.1 - 1.0 K/uL   Eosinophils Relative 0 %   Eosinophils Absolute 0.0 0.0 - 0.5 K/uL  Basophils Relative 0 %   Basophils Absolute 0.0 0.0 - 0.1 K/uL   Immature Granulocytes 0 %   Abs Immature Granulocytes 0.02 0.00 - 0.07 K/uL    Comment: Performed at [facility], [address] [place], [place] [address]  Basic metabolic panel     Status: Abnormal   Collection Time: 09/01/22  8:28 AM  Result Value Ref Range   Sodium 127 (L) 135 - 145 mmol/L   Potassium 3.7 3.5 - 5.1 mmol/L   Chloride 97 (L) 98 - 111 mmol/L   CO2 20 (L) 22 - 32 mmol/L   Glucose, Bld 110 (H) 70 - 99 mg/dL    Comment: Glucose reference range applies only to samples taken after fasting for at least 8 hours.   BUN 5 (L) 8 - 23 mg/dL   Creatinine, Ser 2.44 0.44 - 1.00 mg/dL   Calcium 7.7 (L) 8.9 - 10.3  mg/dL   GFR, Estimated >01 >02 mL/min    Comment: (NOTE) Calculated using the CKD-EPI Creatinine Equation (2021)    Anion gap 10 5 - 15    Comment: Performed at [facility], [address] [place], [place] [address]  Type and screen [facility]     Status: None   Collection Time: 09/01/22  8:38 AM  Result Value Ref Range   ABO/RH(D) O POS    Antibody Screen NEG    Sample Expiration      09/04/2022,2359 Performed at [facility], [address] [place], [place] [address]     Chest Portable 1 View  Result Date: 09/01/2022 CLINICAL DATA:  Ongoing cough after diagnosis of bronchitis 1 month ago EXAM: PORTABLE CHEST 1 VIEW COMPARISON:  Chest radiograph dated 07/29/2021 FINDINGS: Normal lung volumes. No focal consolidations. No pleural effusion or pneumothorax. The heart size and mediastinal contours are within normal limits. Postsurgical changes of left clavicular fracture fixation. Hardware appears intact. IMPRESSION: No active disease. Electronically Signed   By: Agustin Cree M.D.   On: 09/01/2022 09:12   DG Femur Min 2 Views Left  Result Date: 09/01/2022 CLINICAL DATA:  Fall on a slippery dac.  Left leg pain. EXAM: LEFT FEMUR 2 VIEWS COMPARISON:  None Available. FINDINGS: There is comminuted and displaced fracture of the distal left femur just above the knee arthroplasty. The distal fracture fragment is displaced approximately 2.3 cm posteriorly and 1.4 cm medially. There is associated moderate suprapatellar knee joint effusion. No other acute fracture or dislocation. No aggressive osseous lesion. Mild degenerative changes of left hip joint without significant joint space narrowing. There is acetabular osteophytosis. Left knee arthroplasty noted. There is near anatomic alignment. No periprosthetic lucency. No focal soft tissue swelling. No radiopaque foreign bodies. IMPRESSION: 1. Comminuted and displaced fracture of the distal left femur just above the knee  arthroplasty. Electronically Signed   By: Jules Schick M.D.   On: 09/01/2022 08:53    Review of Systems  HENT:  Negative for ear discharge, ear pain, hearing loss and tinnitus.   Eyes:  Negative for photophobia and pain.  Respiratory:  Negative for cough and shortness of breath.   Cardiovascular:  Negative for chest pain.  Gastrointestinal:  Negative for abdominal pain, nausea and vomiting.  Genitourinary:  Negative for dysuria, flank pain, frequency and urgency.  Musculoskeletal:  Positive for arthralgias (Left knee). Negative for back pain, myalgias and neck pain.  Neurological:  Negative for dizziness and headaches.  Hematological:  Does not bruise/bleed  easily.  Psychiatric/Behavioral:  The patient is not nervous/anxious.    Blood pressure 137/80, pulse 95, temperature 98.4 F (36.9 C), temperature source Oral, resp. rate 18, SpO2 100%. Physical Exam Constitutional:      General: She is not in acute distress.    Appearance: She is well-developed. She is not diaphoretic.  HENT:     Head: Normocephalic and atraumatic.  Eyes:     General: No scleral icterus.       Right eye: No discharge.        Left eye: No discharge.     Conjunctiva/sclera: Conjunctivae normal.  Cardiovascular:     Rate and Rhythm: Normal rate and regular rhythm.  Pulmonary:     Effort: Pulmonary effort is normal. No respiratory distress.  Musculoskeletal:     Cervical back: Normal range of motion.     Comments: LLE No traumatic wounds, ecchymosis, or rash  In [place] but removed, mod TTP knee  No ankle effusion  Sens DPN, SPN, TN intact  Motor EHL, ext, flex, evers 5/5  DP 2+, PT 2+, No significant edema  Skin:    General: Skin is warm and dry.  Neurological:     Mental Status: She is alert.  Psychiatric:        Mood and Affect: Mood normal.        Behavior: Behavior normal.    Assessment/Plan: Left distal femur fx -- Plan ORIF today with Dr. Carola Frost. Please keep NPO.    Freeman Caldron,  PA-C Orthopedic Surgery [phone] 09/01/2022, 9:42 AM

## 2022-09-02 ENCOUNTER — Other Ambulatory Visit: Payer: Self-pay

## 2022-09-02 ENCOUNTER — Encounter (HOSPITAL_COMMUNITY)
Admission: EM | Disposition: A | Discharge status: Home Health | Payer: Self-pay | Source: Home / Self Care | Attending: Internal Medicine

## 2022-09-02 ENCOUNTER — Inpatient Hospital Stay (HOSPITAL_COMMUNITY): Payer: Medicare Other | Admitting: Anesthesiology

## 2022-09-02 ENCOUNTER — Inpatient Hospital Stay (HOSPITAL_COMMUNITY): Payer: Medicare Other

## 2022-09-02 ENCOUNTER — Encounter (HOSPITAL_COMMUNITY): Payer: Self-pay | Admitting: Internal Medicine

## 2022-09-02 DIAGNOSIS — S72401A Unspecified fracture of lower end of right femur, initial encounter for closed fracture: Secondary | ICD-10-CM

## 2022-09-02 DIAGNOSIS — S728X2A Other fracture of left femur, initial encounter for closed fracture: Secondary | ICD-10-CM | POA: Diagnosis not present

## 2022-09-02 HISTORY — PX: ORIF FEMUR FRACTURE: SHX2119

## 2022-09-02 LAB — VITAMIN D 25 HYDROXY (VIT D DEFICIENCY, FRACTURES): Vit D, 25-Hydroxy: 30.51 ng/mL (ref 30–100)

## 2022-09-02 LAB — SURGICAL PCR SCREEN
MRSA, PCR: NEGATIVE
Staphylococcus aureus: NEGATIVE

## 2022-09-02 SURGERY — OPEN REDUCTION INTERNAL FIXATION (ORIF) DISTAL FEMUR FRACTURE
Anesthesia: Monitor Anesthesia Care | Laterality: Left

## 2022-09-02 MED ORDER — HYDROMORPHONE HCL 1 MG/ML IJ SOLN: 0.2500 mg | INTRAMUSCULAR | Status: DC | PRN

## 2022-09-02 MED ORDER — FENTANYL CITRATE (PF) 250 MCG/5ML IJ SOLN
INTRAMUSCULAR | Status: AC
  Filled 2022-09-02: qty 5

## 2022-09-02 MED ORDER — PHENYLEPHRINE 80 MCG/ML (10ML) SYRINGE FOR IV PUSH (FOR BLOOD PRESSURE SUPPORT)
PREFILLED_SYRINGE | INTRAVENOUS | Status: DC | PRN
  Administered 2022-09-02 (×2): 160 ug via INTRAVENOUS

## 2022-09-02 MED ORDER — MIDAZOLAM HCL 2 MG/2ML IJ SOLN: 1.0000 mg | Freq: Once | INTRAMUSCULAR | Status: AC

## 2022-09-02 MED ORDER — CEFAZOLIN SODIUM-DEXTROSE 2-4 GM/100ML-% IV SOLN
2.0000 g | Freq: Three times a day (TID) | INTRAVENOUS | Status: AC
  Administered 2022-09-02 – 2022-09-03 (×3): 2 g via INTRAVENOUS
  Filled 2022-09-02 (×3): qty 100

## 2022-09-02 MED ORDER — ENOXAPARIN SODIUM 40 MG/0.4ML IJ SOSY
40.0000 mg | PREFILLED_SYRINGE | INTRAMUSCULAR | Status: DC
  Administered 2022-09-03 – 2022-09-06 (×4): 40 mg via SUBCUTANEOUS
  Filled 2022-09-02 (×4): qty 0.4

## 2022-09-02 MED ORDER — TRANEXAMIC ACID-NACL 1000-0.7 MG/100ML-% IV SOLN
1000.0000 mg | Freq: Once | INTRAVENOUS | Status: AC
  Administered 2022-09-02: 1000 mg via INTRAVENOUS
  Filled 2022-09-02: qty 100

## 2022-09-02 MED ORDER — METOCLOPRAMIDE HCL 5 MG/ML IJ SOLN: 5.0000 mg | Freq: Three times a day (TID) | INTRAMUSCULAR | Status: DC | PRN

## 2022-09-02 MED ORDER — ONDANSETRON HCL 4 MG/2ML IJ SOLN: 4.0000 mg | Freq: Four times a day (QID) | INTRAMUSCULAR | Status: DC | PRN

## 2022-09-02 MED ORDER — MIDAZOLAM HCL 2 MG/2ML IJ SOLN
INTRAMUSCULAR | Status: AC
  Filled 2022-09-02: qty 2

## 2022-09-02 MED ORDER — FENTANYL CITRATE (PF) 100 MCG/2ML IJ SOLN: 50.0000 ug | Freq: Once | INTRAMUSCULAR | Status: AC

## 2022-09-02 MED ORDER — PROPOFOL 500 MG/50ML IV EMUL
INTRAVENOUS | Status: DC | PRN
  Administered 2022-09-02: 100 ug/kg/min via INTRAVENOUS

## 2022-09-02 MED ORDER — ROCURONIUM BROMIDE 10 MG/ML (PF) SYRINGE
PREFILLED_SYRINGE | INTRAVENOUS | Status: AC
  Filled 2022-09-02: qty 10

## 2022-09-02 MED ORDER — ENSURE ENLIVE PO LIQD
237.0000 mL | Freq: Two times a day (BID) | ORAL | Status: DC
  Administered 2022-09-02 – 2022-09-06 (×6): 237 mL via ORAL

## 2022-09-02 MED ORDER — PROPOFOL 10 MG/ML IV BOLUS
INTRAVENOUS | Status: DC | PRN
  Administered 2022-09-02: 30 mg via INTRAVENOUS

## 2022-09-02 MED ORDER — HYDROCODONE-ACETAMINOPHEN 7.5-325 MG PO TABS
1.0000 | ORAL_TABLET | ORAL | Status: DC | PRN
  Administered 2022-09-04: 2 via ORAL
  Filled 2022-09-02: qty 2

## 2022-09-02 MED ORDER — ACETAMINOPHEN 325 MG PO TABS
325.0000 mg | ORAL_TABLET | Freq: Four times a day (QID) | ORAL | Status: DC | PRN
  Administered 2022-09-03: 650 mg via ORAL
  Filled 2022-09-02: qty 2

## 2022-09-02 MED ORDER — 0.9 % SODIUM CHLORIDE (POUR BTL) OPTIME
TOPICAL | Status: DC | PRN
  Administered 2022-09-02: 1000 mL

## 2022-09-02 MED ORDER — METOCLOPRAMIDE HCL 5 MG PO TABS: 5.0000 mg | ORAL_TABLET | Freq: Three times a day (TID) | ORAL | Status: DC | PRN

## 2022-09-02 MED ORDER — DEXAMETHASONE SODIUM PHOSPHATE 10 MG/ML IJ SOLN
INTRAMUSCULAR | Status: DC | PRN
Start: 2022-09-02 — End: 2022-09-02
  Administered 2022-09-02: 10 mg

## 2022-09-02 MED ORDER — CEFAZOLIN SODIUM-DEXTROSE 2-3 GM-%(50ML) IV SOLR
INTRAVENOUS | Status: DC | PRN
Start: 2022-09-02 — End: 2022-09-02
  Administered 2022-09-02: 2 g via INTRAVENOUS

## 2022-09-02 MED ORDER — OXYCODONE HCL 5 MG/5ML PO SOLN: 5.0000 mg | Freq: Once | ORAL | Status: DC | PRN

## 2022-09-02 MED ORDER — ROPIVACAINE HCL 5 MG/ML IJ SOLN
INTRAMUSCULAR | Status: DC | PRN
Start: 2022-09-02 — End: 2022-09-02
  Administered 2022-09-02: 30 mL via EPIDURAL

## 2022-09-02 MED ORDER — CHLORHEXIDINE GLUCONATE 0.12 % MT SOLN: 15.0000 mL | Freq: Once | OROMUCOSAL | Status: AC

## 2022-09-02 MED ORDER — BUPIVACAINE IN DEXTROSE 0.75-8.25 % IT SOLN
INTRATHECAL | Status: DC | PRN
  Administered 2022-09-02: 1.8 mL via INTRATHECAL

## 2022-09-02 MED ORDER — AMISULPRIDE (ANTIEMETIC) 5 MG/2ML IV SOLN: 10.0000 mg | Freq: Once | INTRAVENOUS | Status: DC | PRN

## 2022-09-02 MED ORDER — POLYETHYLENE GLYCOL 3350 17 G PO PACK
17.0000 g | PACK | Freq: Every day | ORAL | Status: DC | PRN
  Administered 2022-09-02 – 2022-09-03 (×2): 17 g via ORAL
  Filled 2022-09-02 (×2): qty 1

## 2022-09-02 MED ORDER — MIDAZOLAM HCL 2 MG/2ML IJ SOLN
INTRAMUSCULAR | Status: AC
  Administered 2022-09-02: 1 mg via INTRAVENOUS
  Filled 2022-09-02: qty 2

## 2022-09-02 MED ORDER — ORAL CARE MOUTH RINSE: 15.0000 mL | Freq: Once | OROMUCOSAL | Status: AC

## 2022-09-02 MED ORDER — OXYCODONE HCL 5 MG PO TABS: 5.0000 mg | ORAL_TABLET | Freq: Once | ORAL | Status: DC | PRN

## 2022-09-02 MED ORDER — LACTATED RINGERS IV SOLN: INTRAVENOUS | Status: DC

## 2022-09-02 MED ORDER — DOCUSATE SODIUM 100 MG PO CAPS
100.0000 mg | ORAL_CAPSULE | Freq: Two times a day (BID) | ORAL | Status: DC
  Administered 2022-09-04 – 2022-09-05 (×2): 100 mg via ORAL
  Filled 2022-09-02 (×4): qty 1

## 2022-09-02 MED ORDER — ONDANSETRON HCL 4 MG PO TABS: 4.0000 mg | ORAL_TABLET | Freq: Four times a day (QID) | ORAL | Status: DC | PRN

## 2022-09-02 MED ORDER — FENTANYL CITRATE (PF) 100 MCG/2ML IJ SOLN
INTRAMUSCULAR | Status: AC
  Administered 2022-09-02: 50 ug via INTRAVENOUS
  Filled 2022-09-02: qty 2

## 2022-09-02 MED ORDER — HYDROCODONE-ACETAMINOPHEN 5-325 MG PO TABS
1.0000 | ORAL_TABLET | ORAL | Status: DC | PRN
  Administered 2022-09-02: 1 via ORAL
  Administered 2022-09-02 – 2022-09-03 (×3): 2 via ORAL
  Administered 2022-09-03 (×2): 1 via ORAL
  Administered 2022-09-04 – 2022-09-05 (×5): 2 via ORAL
  Filled 2022-09-02 (×11): qty 2

## 2022-09-02 MED ORDER — SODIUM CHLORIDE 0.9 % IV SOLN: INTRAVENOUS | Status: DC

## 2022-09-02 MED ORDER — VANCOMYCIN HCL 1000 MG IV SOLR
INTRAVENOUS | Status: AC
  Filled 2022-09-02: qty 20

## 2022-09-02 MED ORDER — ONDANSETRON HCL 4 MG/2ML IJ SOLN: 4.0000 mg | Freq: Once | INTRAMUSCULAR | Status: DC | PRN

## 2022-09-02 MED ORDER — PHENYLEPHRINE HCL-NACL 20-0.9 MG/250ML-% IV SOLN
INTRAVENOUS | Status: DC | PRN
  Administered 2022-09-02: 50 ug/min via INTRAVENOUS

## 2022-09-02 MED ORDER — KETOROLAC TROMETHAMINE 30 MG/ML IJ SOLN
INTRAMUSCULAR | Status: AC
  Filled 2022-09-02: qty 1

## 2022-09-02 MED ORDER — CHLORHEXIDINE GLUCONATE 0.12 % MT SOLN
OROMUCOSAL | Status: AC
  Administered 2022-09-02: 15 mL via OROMUCOSAL
  Filled 2022-09-02: qty 15

## 2022-09-02 MED ORDER — FENTANYL CITRATE (PF) 250 MCG/5ML IJ SOLN
INTRAMUSCULAR | Status: DC | PRN
  Administered 2022-09-02 (×2): 50 ug via INTRAVENOUS

## 2022-09-02 MED ORDER — MORPHINE SULFATE (PF) 2 MG/ML IV SOLN
0.5000 mg | INTRAVENOUS | Status: DC | PRN
  Administered 2022-09-02 – 2022-09-05 (×7): 1 mg via INTRAVENOUS
  Filled 2022-09-02 (×7): qty 1

## 2022-09-02 MED ORDER — IBUPROFEN 200 MG PO TABS
800.0000 mg | ORAL_TABLET | Freq: Three times a day (TID) | ORAL | Status: DC | PRN
  Administered 2022-09-02 – 2022-09-03 (×3): 800 mg via ORAL
  Filled 2022-09-02 (×3): qty 4

## 2022-09-02 MED ORDER — VANCOMYCIN HCL 1000 MG IV SOLR
INTRAVENOUS | Status: DC | PRN
  Administered 2022-09-02: 1000 mg via TOPICAL

## 2022-09-02 SURGICAL SUPPLY — 78 items
ADH SKN CLS APL DERMABOND .7 (GAUZE/BANDAGES/DRESSINGS) ×1
APL PRP STRL LF DISP 70% ISPRP (MISCELLANEOUS) ×1
BAG COUNTER SPONGE SURGICOUNT (BAG) ×2 IMPLANT
BAG SPNG CNTER NS LX DISP (BAG) ×1
BIT DRILL 4.3 (BIT) ×1
BIT DRILL 4.3X300MM (BIT) IMPLANT
BIT DRILL LONG 3.3 (BIT) IMPLANT
BIT DRILL QC 3.3X195 (BIT) IMPLANT
BLADE CLIPPER SURG (BLADE) IMPLANT
BNDG CMPR 5X4 KNIT ELC UNQ LF (GAUZE/BANDAGES/DRESSINGS) ×1
BNDG CMPR 5X6 CHSV STRCH STRL (GAUZE/BANDAGES/DRESSINGS) ×1
BNDG CMPR 6 X 5 YARDS HK CLSR (GAUZE/BANDAGES/DRESSINGS) ×1
BNDG CMPR MED 10X6 ELC LF (GAUZE/BANDAGES/DRESSINGS) ×1
BNDG COHESIVE 6X5 TAN ST LF (GAUZE/BANDAGES/DRESSINGS) ×2 IMPLANT
BNDG ELASTIC 4INX 5YD STR LF (GAUZE/BANDAGES/DRESSINGS) IMPLANT
BNDG ELASTIC 6INX 5YD STR LF (GAUZE/BANDAGES/DRESSINGS) IMPLANT
BNDG ELASTIC 6X10 VLCR STRL LF (GAUZE/BANDAGES/DRESSINGS) ×2 IMPLANT
BRUSH SCRUB EZ PLAIN DRY (MISCELLANEOUS) ×4 IMPLANT
CANISTER SUCT 3000ML PPV (MISCELLANEOUS) ×2 IMPLANT
CAP LOCK NCB (Cap) IMPLANT
CHLORAPREP W/TINT 26 (MISCELLANEOUS) ×2 IMPLANT
COVER SURGICAL LIGHT HANDLE (MISCELLANEOUS) ×2 IMPLANT
DERMABOND ADVANCED .7 DNX12 (GAUZE/BANDAGES/DRESSINGS) IMPLANT
DRAPE C-ARM 42X72 X-RAY (DRAPES) ×2 IMPLANT
DRAPE C-ARMOR (DRAPES) ×2 IMPLANT
DRAPE HALF SHEET 40X57 (DRAPES) ×4 IMPLANT
DRAPE ORTHO SPLIT 77X108 STRL (DRAPES) ×2
DRAPE SURG 17X23 STRL (DRAPES) ×2 IMPLANT
DRAPE SURG ORHT 6 SPLT 77X108 (DRAPES) ×4 IMPLANT
DRAPE U-SHAPE 47X51 STRL (DRAPES) ×2 IMPLANT
DRESSING MEPILEX FLEX 4X4 (GAUZE/BANDAGES/DRESSINGS) IMPLANT
DRSG ADAPTIC 3X8 NADH LF (GAUZE/BANDAGES/DRESSINGS) IMPLANT
DRSG MEPILEX FLEX 4X4 (GAUZE/BANDAGES/DRESSINGS) ×1
DRSG MEPILEX POST OP 4X12 (GAUZE/BANDAGES/DRESSINGS) IMPLANT
DRSG MEPILEX POST OP 4X8 (GAUZE/BANDAGES/DRESSINGS) IMPLANT
ELECT REM PT RETURN 9FT ADLT (ELECTROSURGICAL) ×1
ELECTRODE REM PT RTRN 9FT ADLT (ELECTROSURGICAL) ×2 IMPLANT
GAUZE PAD ABD 8X10 STRL (GAUZE/BANDAGES/DRESSINGS) ×6 IMPLANT
GAUZE SPONGE 4X4 12PLY STRL (GAUZE/BANDAGES/DRESSINGS) ×2 IMPLANT
GLOVE BIO SURGEON STRL SZ 6.5 (GLOVE) ×6 IMPLANT
GLOVE BIO SURGEON STRL SZ7.5 (GLOVE) ×8 IMPLANT
GLOVE BIOGEL PI IND STRL 6.5 (GLOVE) ×2 IMPLANT
GLOVE BIOGEL PI IND STRL 7.5 (GLOVE) ×2 IMPLANT
GOWN STRL REUS W/ TWL LRG LVL3 (GOWN DISPOSABLE) ×6 IMPLANT
GOWN STRL REUS W/TWL LRG LVL3 (GOWN DISPOSABLE) ×3
K-WIRE 2.0 (WIRE) ×1
K-WIRE FXSTD 280X2XNS SS (WIRE) ×1
KIT BASIN OR (CUSTOM PROCEDURE TRAY) ×2 IMPLANT
KIT TURNOVER KIT B (KITS) ×2 IMPLANT
KWIRE FXSTD 280X2XNS SS (WIRE) IMPLANT
NS IRRIG 1000ML POUR BTL (IV SOLUTION) ×2 IMPLANT
PACK TOTAL JOINT (CUSTOM PROCEDURE TRAY) ×2 IMPLANT
PAD ARMBOARD 7.5X6 YLW CONV (MISCELLANEOUS) ×4 IMPLANT
PAD CAST 4YDX4 CTTN HI CHSV (CAST SUPPLIES) ×2 IMPLANT
PADDING CAST COTTON 4X4 STRL (CAST SUPPLIES) ×1
PADDING CAST COTTON 6X4 STRL (CAST SUPPLIES) ×2 IMPLANT
PLATE DIST FEM 12H (Plate) IMPLANT
SCREW 5.0 80MM (Screw) IMPLANT
SCREW NCB 3.5X75X5X6.2XST (Screw) IMPLANT
SCREW NCB 4.0X36MM (Screw) IMPLANT
SCREW NCB 5.0X36MM (Screw) IMPLANT
SCREW NCB 5.0X75MM (Screw) ×1 IMPLANT
SCREW NCB 5.0X85MM (Screw) IMPLANT
SPONGE T-LAP 18X18 ~~LOC~~+RFID (SPONGE) IMPLANT
STAPLER VISISTAT 35W (STAPLE) ×2 IMPLANT
SUCTION TUBE FRAZIER 10FR DISP (SUCTIONS) ×2 IMPLANT
SUT ETHILON 3 0 PS 1 (SUTURE) ×4 IMPLANT
SUT MNCRL AB 3-0 PS2 27 (SUTURE) IMPLANT
SUT MON AB 2-0 CT1 36 (SUTURE) IMPLANT
SUT VIC AB 0 CT1 27 (SUTURE)
SUT VIC AB 0 CT1 27XBRD ANBCTR (SUTURE) IMPLANT
SUT VIC AB 1 CT1 27 (SUTURE)
SUT VIC AB 1 CT1 27XBRD ANBCTR (SUTURE) IMPLANT
SUT VIC AB 2-0 CT1 27 (SUTURE) ×2
SUT VIC AB 2-0 CT1 TAPERPNT 27 (SUTURE) ×4 IMPLANT
TOWEL GREEN STERILE (TOWEL DISPOSABLE) ×4 IMPLANT
TRAY FOLEY MTR SLVR 16FR STAT (SET/KITS/TRAYS/PACK) IMPLANT
WATER STERILE IRR 1000ML POUR (IV SOLUTION) ×4 IMPLANT

## 2022-09-02 NOTE — Progress Notes (Signed)
Orthopedic Tech Progress Note Patient Details:  Mandy Hanson 1950-08-24 [id number]  Patient ID: Garth Schlatter, female   DOB: 03-26-1950, 72 y.o.   MRN: [id number] Patient doesn't meet criteria for ohf. Patient must be under 70 to get ohf. Trinna Post 09/02/2022, 12:56 PM

## 2022-09-02 NOTE — Anesthesia Postprocedure Evaluation (Signed)
Anesthesia Post Note  Patient: NANCEE KARAHALIOS  Procedure(s) Performed: OPEN REDUCTION INTERNAL FIXATION (ORIF) DISTAL FEMUR FRACTURE (Left)     Patient location during evaluation: PACU Anesthesia Type: Regional, MAC and Spinal Level of consciousness: awake and alert and oriented Pain management: pain level controlled Vital Signs Assessment: post-procedure vital signs reviewed and stable Respiratory status: spontaneous breathing, nonlabored ventilation and respiratory function stable Cardiovascular status: blood pressure returned to baseline and stable Postop Assessment: no headache, no backache, spinal receding and patient able to bend at knees Anesthetic complications: no   No notable events documented.  Last Vitals:  Vitals:   09/02/22 1030 09/02/22 1045  BP: 124/67 119/66  Pulse: 75 68  Resp: 15 12  Temp:  36.4 C  SpO2: 96% 96%    Last Pain:  Vitals:   09/02/22 1045  TempSrc:   PainSc: 0-No pain                 Lannie Fields

## 2022-09-02 NOTE — Anesthesia Preprocedure Evaluation (Signed)
Anesthesia Evaluation  Patient identified by MRN, date of birth, ID band Patient awake    Reviewed: Allergy & Precautions, H&P , NPO status , Patient's Chart, lab work & pertinent test results  Airway Mallampati: II  TM Distance: >3 FB Neck ROM: Full    Dental  (+) Teeth Intact, Dental Advisory Given   Pulmonary asthma (well controlled)    Pulmonary exam normal breath sounds clear to auscultation       Cardiovascular negative cardio ROS Normal cardiovascular exam Rhythm:Regular Rate:Normal     Neuro/Psych negative neurological ROS  negative psych ROS   GI/Hepatic Neg liver ROS,GERD  Controlled,,  Endo/Other  negative endocrine ROS    Renal/GU negative Renal ROS  negative genitourinary   Musculoskeletal  (+) Arthritis , Osteoarthritis,  L distal femur fx   Abdominal   Peds negative pediatric ROS (+)  Hematology negative hematology ROS (+) Hb 12.1, plt 228   Anesthesia Other Findings   Reproductive/Obstetrics negative OB ROS                             Anesthesia Physical Anesthesia Plan  ASA: 2  Anesthesia Plan: MAC, Regional and Spinal   Post-op Pain Management: Tylenol PO (pre-op)* and Regional block*   Induction:   PONV Risk Score and Plan: 2 and Propofol infusion and TIVA  Airway Management Planned: Natural Airway and Simple Face Mask  Additional Equipment: None  Intra-op Plan:   Post-operative Plan:   Informed Consent: I have reviewed the patients History and Physical, chart, labs and discussed the procedure including the risks, benefits and alternatives for the proposed anesthesia with the patient or authorized representative who has indicated his/her understanding and acceptance.       Plan Discussed with: CRNA  Anesthesia Plan Comments:        Anesthesia Quick Evaluation

## 2022-09-02 NOTE — Anesthesia Procedure Notes (Signed)
Spinal  Patient location during procedure: OR Start time: 09/02/2022 9:06 AM End time: 09/02/2022 9:09 AM Reason for block: surgical anesthesia Staffing Performed: anesthesiologist  Anesthesiologist: Lannie Fields, DO Performed by: Lannie Fields, DO Authorized by: Lannie Fields, DO   Preanesthetic Checklist Completed: patient identified, IV checked, risks and benefits discussed, surgical consent, monitors and equipment checked, pre-op evaluation and timeout performed Spinal Block Patient position: sitting Prep: DuraPrep and site prepped and draped Patient monitoring: cardiac monitor, continuous pulse ox and blood pressure Approach: midline Location: L3-4 Injection technique: single-shot Needle Needle type: Pencan  Needle gauge: 24 G Needle length: 9 cm Assessment Sensory level: T6 Events: CSF return Additional Notes Functioning IV was confirmed and monitors were applied. Sterile prep and drape, including hand hygiene and sterile gloves were used. The patient was positioned and the spine was prepped. The skin was anesthetized with lidocaine.  Free flow of clear CSF was obtained prior to injecting local anesthetic into the CSF.  The spinal needle aspirated freely following injection.  The needle was carefully withdrawn.  The patient tolerated the procedure well.

## 2022-09-02 NOTE — Op Note (Signed)
Orthopaedic Surgery Operative Note (CSN: [id number] ) Date of Surgery: 09/02/2022  Admit Date: 09/01/2022   Diagnoses: Pre-Op Diagnoses: Left periprosthetic distal femur fracture  Post-Op Diagnosis: Same  Procedures: CPT 27511-Open reduction internal fixation of left supracondylar distal femur fracture  Surgeons : Primary: Roby Lofts, MD  Assistant: Ulyses Southward, PA-C  Location: OR 3   Anesthesia: Spinal with regional block   Antibiotics: Ancef 2g preop with 1 gm vancomycin powder placed topically   Tourniquet time: None    Estimated Blood Loss:75 mL  Complications: None   Specimens: None   Implants: Implant Name Type Inv. Item Serial No. Manufacturer Lot No. LRB No. Used Action  CAP LOCK NCB - [id number] Cap CAP LOCK NCB  [employer](ORTH,TRAU,BIO,SG)  Left 8 Implanted  PLATE DIST FEM 40H - [id number] Plate PLATE DIST FEM 75I  [employer](ORTH,TRAU,BIO,SG)  Left 1 Implanted  SCREW 5.0 - [id number] Screw SCREW 5.0  [employer](ORTH,TRAU,BIO,SG)  Left 3 Implanted  SCREW NCB 5.0X36MM - [id number] Screw SCREW NCB 5.0X36MM  [employer](ORTH,TRAU,BIO,SG)  Left 2 Implanted  SCREW NCB 5.0X85MM - [id number] Screw SCREW NCB 5.0X85MM  [employer](ORTH,TRAU,BIO,SG)  Left 2 Implanted  SCREW NCB 5.0X75MM - [id number] Screw SCREW NCB 5.0X75MM  Z[employer]ON(ORTH,TRAU,BIO,SG)  Left 1 Implanted  SCREW NCB 4.0X36MM - [id number] Screw SCREW NCB 4.0X36MM  Z[employer](ORTH,TRAU,BIO,SG)  Left 1 Implanted     Indications for Surgery: 72 year old female who sustained a periprosthetic left supracondylar distal femur fracture.  Due to the unstable nature of her injury I recommend proceeding with open reduction internal fixation.  Risk and benefits were discussed with the patient.  Risks include but not limited to bleeding, infection, malunion, nonunion, hardware failure, hardware irritation, nerve and blood vessel injury, DVT, knee stiffness, even the possibility anesthetic  complications.  She agreed to proceed with surgery and consent was obtained.  Operative Findings: Open duction internal fixation of left supracondylar distal femur fracture using [employer] NCB distal femoral locking plate.  Procedure: The patient was identified in the preoperative holding area. Consent was confirmed with the patient and their family and all questions were answered. The operative extremity was marked after confirmation with the patient. she was then brought back to the operating room by our anesthesia colleagues.  She was placed under spinal and regional anesthetic and carefully transferred over to radiolucent flattop table.  A bump was placed under her operative hip.  The left lower extremity was then prepped and draped in usual sterile fashion.  A timeout was performed to verify the patient, the procedure, and the extremity.  Preoperative antibiotics were dosed.  The hip and knee were flexed over a triangle and fluoroscopic imaging showed the unstable nature of her injury.  Traction was applied to align the fracture appropriately and then a lateral incision was made carried down through skin and subcutaneous tissue.  I incised through the IT band and mobilized the vastus lateralis to expose the fracture in the lateral aspect of the condyle.  Once I had reduction adequate I then connected the 12 hole [employer] NCB distal femoral locking plate to a targeting arm and slid this submuscularly along the lateral cortex of the femur.  I aligned the distal portion of the plate appropriately and placed a 2.0 mm K wire to hold it in position.  I then percutaneously placed a 3.3 mm drill bit through the targeting arm to align the proximal portion of the plate.  Once I had the plate in position  I then drilled and placed 5.0 millimeter screws distally to bring the plate flush to bone.  I then percutaneously placed 5.0 millimeter screws in the femoral shaft.  I exchanged the 3.3 mm drill bit  proximally for 4.0 millimeter screw.  I placed locking caps on the 5.0 millimeter screws and removed the targeting arm.  I then proceeded to place 4 more 5.0 millimeter screws in the distal segment.  Locking caps were placed in all of the distal screws.  Final fluoroscopic imaging was obtained.  The incisions were copiously irrigated.  A gram of vancomycin powder was placed into the incision.  A layered closure of 0 Vicryl, 2-0 Monocryl and 3-0 Monocryl with Dermabond was used to close the skin.  Sterile dressings were applied.  The patient was then awoken from anesthesia and taken to the PACU in stable condition.  Post Op Plan/Instructions: Patient will be weightbearing as tolerated to the left lower extremity.  She will receive postoperative Ancef.  She will receive Lovenox for DVT prophylaxis and discharged on an oral DOAC.  Will have her mobilize with physical and Occupational Therapy.  I was present and performed the entire surgery.  Ulyses Southward, PA-C did assist me throughout the case. An assistant was necessary given the difficulty in approach, maintenance of reduction and ability to instrument the fracture.   Truitt Merle, MD [facility]

## 2022-09-02 NOTE — Progress Notes (Signed)
Mandy Hanson  FAO:[id number] DOB: 1950-07-03 DOA: 09/01/2022 PCP: Merri Brunette, MD    Brief Narrative:  72 year old with a history of asthma, GERD, and OA who was transferred to [facility] from [facility] after she fell walking on a dock and ultimately suffered a left femoral periprosthetic fracture.  The fall appeared to clearly be mechanical in nature and the patient was without complaint other than the expected pain from this significant fracture.  Vital signs were stable and she was afebrile at presentation.  Goals of Care:   Code Status: Full Code   DVT prophylaxis: enoxaparin (LOVENOX) injection 40 mg Start: 09/03/22 0800 SCDs Start: 09/02/22 1111 SCDs Start: 09/01/22 0837  Interim Hx: Afebrile.  Vital signs stable.  Saturation 96% room air.  Resting comfortably back in her room status post Orthopedic Surgery.  In good spirits.  No complaints.  Pain improved.  Assessment & Plan:  Comminuted and displaced fracture of the distal left femur just above previous knee arthroplasty Care per orthopedic trauma team - to OR 8/30 for open reduction internal fixation of left supracondylar distal femur fracture -now cleared to be weightbearing as tolerated -Lovenox for DVT prophylaxis while hospitalized with plan to discharge on DOAC -PT/OT to evaluate  Asthma Appears to be well-controlled and certainly stable at present  GERD Continue PPI  Mild hyponatremia Appears to be chronic in nature with review of sodium over last 10 years revealing baseline range of 129-133 -monitor with simple volume resuscitation   Family Communication: Spoke with husband at bedside Disposition: Medically stable for disposition at discretion of orthopedic team   Objective: Blood pressure 120/61, pulse 97, temperature 98.4 F (36.9 C), temperature source Oral, resp. rate 18, height 5\' 10"  (1.778 m), weight 79.4 kg, SpO2 96%.  Intake/Output Summary (Last 24 hours) at 09/02/2022 1203 Last data  filed at 09/02/2022 1009 Gross per 24 hour  Intake 950 ml  Output 475 ml  Net 475 ml   Filed Weights   09/01/22 1309  Weight: 79.4 kg    Examination: General: No acute respiratory distress Lungs: Clear to auscultation bilaterally without wheezes or crackles Cardiovascular: Regular rate and rhythm without murmur gallop or rub normal S1 and S2 Abdomen: Nontender, nondistended, soft, bowel sounds positive, no rebound, no ascites, no appreciable mass Extremities: No significant cyanosis, clubbing, or edema bilateral lower extremities  CBC: Recent Labs  Lab 09/01/22 0828  WBC 5.9  NEUTROABS 5.0  HGB 12.1  HCT 35.4*  MCV 99.4  PLT 228   Basic Metabolic Panel: Recent Labs  Lab 09/01/22 0828  NA 127*  K 3.7  CL 97*  CO2 20*  GLUCOSE 110*  BUN 5*  CREATININE 0.66  CALCIUM 7.7*   GFR: Estimated Creatinine Clearance: 69.7 mL/min (by C-G formula based on SCr of 0.66 mg/dL).   Scheduled Meds:  azelastine  1 spray Each Nare BID   docusate sodium  100 mg Oral BID   [START ON 09/03/2022] enoxaparin (LOVENOX) injection  40 mg Subcutaneous Q24H   fluticasone  2 spray Each Nare BID   lubiprostone  24 mcg Oral BID WC   mometasone-formoterol  2 puff Inhalation BID   montelukast  10 mg Oral QHS   pantoprazole  40 mg Oral Daily   Continuous Infusions:  sodium chloride 50 mL/hr at 09/02/22 1129    ceFAZolin (ANCEF) IV     methocarbamol (ROBAXIN) IV     tranexamic acid       LOS: 1 day  Lonia Blood, MD [facility] Office  [phone] Pager - Text Page per Amion  If 7PM-7AM, please contact night-coverage per Amion 09/02/2022, 12:03 PM

## 2022-09-02 NOTE — Anesthesia Procedure Notes (Signed)
Anesthesia Regional Block: Adductor canal block   Pre-Anesthetic Checklist: , timeout performed,  Correct Patient, Correct Site, Correct Laterality,  Correct Procedure, Correct Position, site marked,  Risks and benefits discussed,  Surgical consent,  Pre-op evaluation,  At surgeon's request and post-op pain management  Laterality: Left  Prep: Maximum Sterile Barrier Precautions used, chloraprep       Needles:  Injection technique: Single-shot  Needle Type: Echogenic Stimulator Needle     Needle Length: 9cm  Needle Gauge: 22     Additional Needles:   Procedures:,,,, ultrasound used (permanent image in chart),,    Narrative:  Start time: 09/02/2022 8:40 AM End time: 09/02/2022 8:45 AM Injection made incrementally with aspirations every 5 mL.  Performed by: Personally  Anesthesiologist: Lannie Fields, DO  Additional Notes: Monitors applied. No increased pain on injection. No increased resistance to injection. Injection made in 5cc increments. Good needle visualization. Patient tolerated procedure well.

## 2022-09-02 NOTE — Progress Notes (Signed)
Initial Nutrition Assessment  DOCUMENTATION CODES:   Not applicable  INTERVENTION:  - Continue Regular diet   - Add Ensure Enlive po BID, each supplement provides 350 kcal and 20 grams of protein.  NUTRITION DIAGNOSIS:   Increased nutrient needs related to hip fracture as evidenced by estimated needs.  GOAL:   Patient will meet greater than or equal to 90% of their needs  MONITOR:   PO intake, Supplement acceptance  REASON FOR ASSESSMENT:   Consult Hip fracture protocol  ASSESSMENT:   72 y.o. female admits related to L femur fracture s/p mechanical fall. PMH includes: asthma, diverticulosis, esophagitis, GERD, osteoarthritis.  Meds reviewed: colace. Labs reviewed: Na low, BUN/Creatinine elevated.   RD unable to reach pt via phone. No intakes noted yet per record. Needs increased related to fracture. RD will add Ensure BID for now and continue to monitor PO intakes.   NUTRITION - FOCUSED PHYSICAL EXAM:  Remote assessment.  Diet Order:   Diet Order             Diet regular Room service appropriate? Yes; Fluid consistency: Thin  Diet effective now                   EDUCATION NEEDS:   Not appropriate for education at this time  Skin:  Skin Assessment: Skin Integrity Issues: Skin Integrity Issues:: Incisions Incisions: L leg  Last BM:  8/27  Height:   Ht Readings from Last 1 Encounters:  09/01/22 5\' 10"  (1.778 m)    Weight:   Wt Readings from Last 1 Encounters:  09/01/22 79.4 kg    Ideal Body Weight:     BMI:  Body mass index is 25.11 kg/m.  Estimated Nutritional Needs:   Kcal:  2300-2700 kcals  Protein:  115-135 gm  Fluid:  >/= 2.3 L  Bethann Humble, RD, LDN, CNSC.

## 2022-09-02 NOTE — Plan of Care (Signed)
  Problem: Pain Managment: Goal: General experience of comfort will improve Outcome: Progressing   Problem: Safety: Goal: Ability to remain free from injury will improve Outcome: Progressing   Problem: Skin Integrity: Goal: Risk for impaired skin integrity will decrease Outcome: Progressing   

## 2022-09-02 NOTE — Anesthesia Procedure Notes (Signed)
Procedure Name: MAC Date/Time: 09/02/2022 9:00 AM  Performed by: Marena Chancy, CRNAPre-anesthesia Checklist: Patient identified, Emergency Drugs available, Suction available, Patient being monitored and Timeout performed Patient Re-evaluated:Patient Re-evaluated prior to induction Oxygen Delivery Method: Simple face mask

## 2022-09-02 NOTE — Discharge Instructions (Signed)
[facility] Discharge Instructions   General Discharge Instructions  WEIGHT BEARING STATUS: Weightbearing as tolerated  RANGE OF MOTION/ACTIVITY: Okay for unrestricted knee range of motion  Wound Care: You may remove your surgical dressing on postop day #2, (Sunday, 09/04/2022). Incisions can be left open to air if there is no drainage. Once the incision is completely dry and without drainage, it may be left open to air out.  Showering may begin postop day #3, (Monday, 09/05/2022).  Clean incision gently with soap and water.  DVT/PE prophylaxis:  Eliquis 2.5 mg twice daily x 30 days  Diet: as you were eating previously.  Can use over the counter stool softeners and bowel preparations, such as Miralax, to help with bowel movements.  Narcotics can be constipating.  Be sure to drink plenty of fluids  PAIN MEDICATION USE AND EXPECTATIONS  You have likely been given narcotic medications to help control your pain.  After a traumatic event that results in an fracture (broken bone) with or without surgery, it is ok to use narcotic pain medications to help control one's pain.  We understand that everyone responds to pain differently and each individual patient will be evaluated on a regular basis for the continued need for narcotic medications. Ideally, narcotic medication use should last no more than 6-8 weeks (coinciding with fracture healing).   As a patient it is your responsibility as well to monitor narcotic medication use and report the amount and frequency you use these medications when you come to your office visit.   We would also advise that if you are using narcotic medications, you should take a dose prior to therapy to maximize you participation.  IF YOU ARE ON NARCOTIC MEDICATIONS IT IS NOT PERMISSIBLE TO OPERATE A MOTOR VEHICLE (MOTORCYCLE/CAR/TRUCK/MOPED) OR HEAVY MACHINERY DO NOT MIX NARCOTICS WITH OTHER CNS (CENTRAL NERVOUS SYSTEM) DEPRESSANTS SUCH AS ALCOHOL   STOP  SMOKING OR USING NICOTINE PRODUCTS!!!!  As discussed nicotine severely impairs your body's ability to heal surgical and traumatic wounds but also impairs bone healing.  Wounds and bone heal by forming microscopic blood vessels (angiogenesis) and nicotine is a vasoconstrictor (essentially, shrinks blood vessels).  Therefore, if vasoconstriction occurs to these microscopic blood vessels they essentially disappear and are unable to deliver necessary nutrients to the healing tissue.  This is one modifiable factor that you can do to dramatically increase your chances of healing your injury.    (This means no smoking, no nicotine gum, patches, etc)  DO NOT USE NONSTEROIDAL ANTI-INFLAMMATORY DRUGS (NSAID'S)  Using products such as Advil (ibuprofen), Aleve (naproxen), Motrin (ibuprofen) for additional pain control during fracture healing can delay and/or prevent the healing response.  If you would like to take over the counter (OTC) medication, Tylenol (acetaminophen) is ok.  However, some narcotic medications that are given for pain control contain acetaminophen as well. Therefore, you should not exceed more than 4000 mg of tylenol in a day if you do not have liver disease.  Also note that there are may OTC medicines, such as cold medicines and allergy medicines that my contain tylenol as well.  If you have any questions about medications and/or interactions please ask your doctor/PA or your pharmacist.      ICE AND ELEVATE INJURED/OPERATIVE EXTREMITY  Using ice and elevating the injured extremity above your heart can help with swelling and pain control.  Icing in a pulsatile fashion, such as 20 minutes on and 20 minutes off, can be followed.  Do not place ice directly on skin. Make sure there is a barrier between to skin and the ice pack.    Using frozen items such as frozen peas works well as the conform nicely to the are that needs to be iced.  USE AN ACE WRAP OR TED HOSE FOR SWELLING CONTROL  In  addition to icing and elevation, Ace wraps or TED hose are used to help limit and resolve swelling.  It is recommended to use Ace wraps or TED hose until you are informed to stop.    When using Ace Wraps start the wrapping distally (farthest away from the body) and wrap proximally (closer to the body)   Example: If you had surgery on your leg or thing and you do not have a splint on, start the ace wrap at the toes and work your way up to the thigh        If you had surgery on your upper extremity and do not have a splint on, start the ace wrap at your fingers and work your way up to the upper arm  CALL THE OFFICE WITH ANY QUESTIONS OR CONCERNS: [phone]   VISIT OUR WEBSITE FOR ADDITIONAL INFORMATION: [email or web address]    Discharge Wound Care Instructions  Do NOT apply any ointments, solutions or lotions to pin sites or surgical wounds.  These prevent needed drainage and even though solutions like hydrogen peroxide kill bacteria, they also damage cells lining the pin sites that help fight infection.  Applying lotions or ointments can keep the wounds moist and can cause them to breakdown and open up as well. This can increase the risk for infection. When in doubt call the office.   If any drainage is noted, use one layer of adaptic or Mepitel, then gauze, Kerlix, and an ace wrap. - These dressing supplies should be available at local medical supply stores [facility], [facility], etc) as well as Insurance claims handler ([facility], [facility], [place], etc)  Once the incision is completely dry and without drainage, it may be left open to air out.  Showering may begin 36-48 hours later.  Cleaning gently with soap and water.  Traumatic wounds should be dressed daily as well.    One layer of adaptic, gauze, Kerlix, then ace wrap.  The adaptic can be discontinued once the draining has ceased    If you have a wet to dry dressing: wet the gauze with saline the squeeze as much saline out so the gauze is  moist (not soaking wet), place moistened gauze over wound, then place a dry gauze over the moist one, followed by Kerlix wrap, then ace wrap.

## 2022-09-02 NOTE — Transfer of Care (Signed)
Immediate Anesthesia Transfer of Care Note  Patient: Mandy Hanson  Procedure(s) Performed: OPEN REDUCTION INTERNAL FIXATION (ORIF) DISTAL FEMUR FRACTURE (Left)  Patient Location: PACU  Anesthesia Type:Regional and Spinal  Level of Consciousness: awake, alert , and oriented  Airway & Oxygen Therapy: Patient Spontanous Breathing  Post-op Assessment: Report given to RN and Post -op Vital signs reviewed and stable  Post vital signs: Reviewed and stable  Last Vitals:  Vitals Value Taken Time  BP 103/52 09/02/22 1021  Temp    Pulse 83 09/02/22 1023  Resp 13 09/02/22 1023  SpO2 94 % 09/02/22 1023  Vitals shown include unfiled device data.  Last Pain:  Vitals:   09/02/22 0840  TempSrc:   PainSc: 3          Complications: No notable events documented.

## 2022-09-02 NOTE — Interval H&P Note (Signed)
History and Physical Interval Note:  09/02/2022 8:27 AM  Mandy Hanson  has presented today for surgery, with the diagnosis of left femur fracture.  The various methods of treatment have been discussed with the patient and family. After consideration of risks, benefits and other options for treatment, the patient has consented to  Procedure(s): OPEN REDUCTION INTERNAL FIXATION (ORIF) DISTAL FEMUR FRACTURE (Left) as a surgical intervention.  The patient's history has been reviewed, patient examined, no change in status, stable for surgery.  I have reviewed the patient's chart and labs.  Questions were answered to the patient's satisfaction.     Caryn Bee P Meleni Delahunt

## 2022-09-03 ENCOUNTER — Encounter (HOSPITAL_COMMUNITY): Payer: Self-pay | Admitting: Student

## 2022-09-03 DIAGNOSIS — S728X2A Other fracture of left femur, initial encounter for closed fracture: Secondary | ICD-10-CM | POA: Diagnosis not present

## 2022-09-03 LAB — BASIC METABOLIC PANEL
Anion gap: 10 (ref 5–15)
BUN: 7 mg/dL — ABNORMAL LOW (ref 8–23)
CO2: 25 mmol/L (ref 22–32)
Calcium: 8.8 mg/dL — ABNORMAL LOW (ref 8.9–10.3)
Chloride: 97 mmol/L — ABNORMAL LOW (ref 98–111)
Creatinine, Ser: 0.66 mg/dL (ref 0.44–1.00)
GFR, Estimated: >60 mL/min (ref >60)
Glucose, Bld: 137 mg/dL — ABNORMAL HIGH (ref 70–99)
Potassium: 3.6 mmol/L (ref 3.5–5.1)
Sodium: 132 mmol/L — ABNORMAL LOW (ref 135–145)

## 2022-09-03 LAB — CBC
HCT: 30.5 % — ABNORMAL LOW (ref 36.0–46.0)
Hemoglobin: 10.3 g/dL — ABNORMAL LOW (ref 12.0–15.0)
MCH: 33.7 pg (ref 26.0–34.0)
MCHC: 33.8 g/dL (ref 30.0–36.0)
MCV: 99.7 fL (ref 80.0–100.0)
Platelets: 235 10*3/uL (ref 150–400)
RBC: 3.06 MIL/uL — ABNORMAL LOW (ref 3.87–5.11)
RDW: 12.6 % (ref 11.5–15.5)
WBC: 8.1 10*3/uL (ref 4.0–10.5)
nRBC: 0 % (ref 0.0–0.2)

## 2022-09-03 NOTE — TOC CAGE-AID Note (Signed)
Transition of Care [facility]) - CAGE-AID Screening   Patient Details  Name: Mandy Hanson MRN: [id number] Date of Birth: 01-17-1950  Hewitt Shorts, RN Trauma Response Nurse Phone Number: [phone] 09/03/2022, 2:12 PM     CAGE-AID Screening:    Have You Ever Felt You Ought to Cut Down on Your Drinking or Drug Use?: No Have People Annoyed You By Critizing Your Drinking Or Drug Use?: No Have You Felt Bad Or Guilty About Your Drinking Or Drug Use?: No Have You Ever Had a Drink or Used Drugs First Thing In The Morning to Steady Your Nerves or to Get Rid of a Hangover?: No CAGE-AID Score: 0  Substance Abuse Education Offered: No (States she drinks regularly, mostly on weekends. Denies any withdrawal symptoms)

## 2022-09-03 NOTE — Progress Notes (Signed)
Mandy Hanson  JXB:[id number] DOB: 1950-08-28 DOA: 09/01/2022 PCP: Merri Brunette, MD    Brief Narrative:  72 year old with a history of asthma, GERD, and OA who was transferred to [facility] from [facility] after she fell walking [place] a dock and ultimately suffered a left femoral periprosthetic fracture.  The fall appeared to clearly be mechanical in nature and the patient was without complaint other than the expected pain from this significant fracture.  Vital signs were stable and she was afebrile at presentation.  Goals of Care:   Code Status: Full Code   DVT prophylaxis: enoxaparin (LOVENOX) injection 40 mg Start: 09/03/22 0800 SCDs Start: 09/02/22 1111 SCDs Start: 09/01/22 0837  Interim Hx: No acute events reported overnight.  Afebrile.  Vital signs stable.  Sodium has improved.  No new complaints today.  Reports that her pain is well-controlled.  Assessment & Plan:  Comminuted and displaced fracture of the distal left femur just above previous knee arthroplasty Care per Orthopedic Trauma team - to OR 8/30 for open reduction internal fixation of left supracondylar distal femur fracture -now cleared to be weightbearing as tolerated -Lovenox for DVT prophylaxis while hospitalized with plan to discharge on DOAC - PT/OT   Asthma well-controlled and stable at present  GERD Continue PPI  Mild hyponatremia Appears to be chronic in nature with review of sodium over last 10 years revealing baseline range of 129-133 -stable at this time   Family Communication: No family present at time of exam today Disposition: Medically stable for disposition at discretion of orthopedic team   Objective: Blood pressure 118/65, pulse 83, temperature (!) 97.5 F (36.4 C), temperature source Oral, resp. rate 18, height 5\' 10"  (1.778 m), weight 79.4 kg, SpO2 98%.  Intake/Output Summary (Last 24 hours) at 09/03/2022 0938 Last data filed at 09/03/2022 0305 Gross per 24 hour  Intake 1677.97 ml   Output 1125 ml  Net 552.97 ml   Filed Weights   09/01/22 1309  Weight: 79.4 kg    Examination: General: No acute respiratory distress Lungs: Clear to auscultation bilaterally  Cardiovascular: Regular rate and rhythm Abdomen: Nontender, nondistended, soft, bowel sounds positive, no rebound Extremities: No signif edema bilateral lower extremities  CBC: Recent Labs  Lab 09/01/22 0828 09/03/22 0321  WBC 5.9 8.1  NEUTROABS 5.0  --   HGB 12.1 10.3*  HCT 35.4* 30.5*  MCV 99.4 99.7  PLT 228 235   Basic Metabolic Panel: Recent Labs  Lab 09/01/22 0828 09/03/22 0321  NA 127* 132*  K 3.7 3.6  CL 97* 97*  CO2 20* 25  GLUCOSE 110* 137*  BUN 5* 7*  CREATININE 0.66 0.66  CALCIUM 7.7* 8.8*   GFR: Estimated Creatinine Clearance: 69.7 mL/min (by C-G formula based on SCr of 0.66 mg/dL).   Scheduled Meds:  azelastine  1 spray Each Nare BID   docusate sodium  100 mg Oral BID   enoxaparin (LOVENOX) injection  40 mg Subcutaneous Q24H   feeding supplement  237 mL Oral BID BM   fluticasone  2 spray Each Nare BID   lubiprostone  24 mcg Oral BID WC   mometasone-formoterol  2 puff Inhalation BID   montelukast  10 mg Oral QHS   pantoprazole  40 mg Oral Daily   Continuous Infusions:  sodium chloride Stopped (09/03/22 0102)   methocarbamol (ROBAXIN) IV       LOS: 2 days   Lonia Blood, MD [facility] Office  [phone] Pager - Text Page per  Amion  If 7PM-7AM, please contact night-coverage per Amion 09/03/2022, 9:38 AM

## 2022-09-03 NOTE — Progress Notes (Signed)
Subjective: 1 Day Post-Op s/p Procedure(s): OPEN REDUCTION INTERNAL FIXATION (ORIF) DISTAL FEMUR FRACTURE   Patient is alert, oriented. Pain severe when trying to move yesterday, well controlled at rest. Nervous about working with PT due to pain. Denies chest pain, SOB, Calf pain. No nausea/vomiting. No other complaints.    Objective:  PE: VITALS:   Vitals:   09/02/22 2022 09/03/22 0019 09/03/22 0457 09/03/22 0737  BP: 135/72 (!) 116/57 (!) 115/55 118/65  Pulse: (!) 107 81 71 83  Resp: 18 18 18 18   Temp: 98.1 F (36.7 C) 98 F (36.7 C) 97.6 F (36.4 C) (!) 97.5 F (36.4 C)  TempSrc: Oral  Oral Oral  SpO2: 97% 97% 99% 98%  Weight:      Height:       General: sitting up in bed, in no acute distress Resp: normal respiratory effort MSK: Incisions CDI. TTP to distal femur.   Sens DPN, SPN, TN intact             Motor EHL, ext, flex, evers 5/5             DP 2+, PT 2+, No significant edema   LABS  Results for orders placed or performed during the hospital encounter of 09/01/22 (from the past 24 hour(s))  VITAMIN D 25 Hydroxy (Vit-D Deficiency, Fractures)     Status: None   Collection Time: 09/02/22 12:28 PM  Result Value Ref Range   Vit D, 25-Hydroxy 30.51 30 - 100 ng/mL  CBC     Status: Abnormal   Collection Time: 09/03/22  3:21 AM  Result Value Ref Range   WBC 8.1 4.0 - 10.5 K/uL   RBC 3.06 (L) 3.87 - 5.11 MIL/uL   Hemoglobin 10.3 (L) 12.0 - 15.0 g/dL   HCT 98.1 (L) 19.1 - 47.8 %   MCV 99.7 80.0 - 100.0 fL   MCH 33.7 26.0 - 34.0 pg   MCHC 33.8 30.0 - 36.0 g/dL   RDW 29.5 62.1 - 30.8 %   Platelets 235 150 - 400 K/uL   nRBC 0.0 0.0 - 0.2 %  Basic metabolic panel     Status: Abnormal   Collection Time: 09/03/22  3:21 AM  Result Value Ref Range   Sodium 132 (L) 135 - 145 mmol/L   Potassium 3.6 3.5 - 5.1 mmol/L   Chloride 97 (L) 98 - 111 mmol/L   CO2 25 22 - 32 mmol/L   Glucose, Bld 137 (H) 70 - 99 mg/dL   BUN 7 (L) 8 - 23 mg/dL   Creatinine, Ser 6.57 0.44  - 1.00 mg/dL   Calcium 8.8 (L) 8.9 - 10.3 mg/dL   GFR, Estimated >84 >69 mL/min   Anion gap 10 5 - 15    DG FEMUR PORT MIN 2 VIEWS LEFT  Result Date: 09/02/2022 CLINICAL DATA:  Fracture. EXAM: LEFT FEMUR PORTABLE 2 VIEWS COMPARISON:  Left femur intraoperative fluoroscopy 09/02/2022, left femur radiographs 09/01/2022 FINDINGS: New lateral plate and screw fixation of the mid to distal left femur, spanning the previously seen comminuted and displaced distal left femoral fracture extending to the level of the distal femoral knee arthroplasty prosthesis. There is improved alignment, now with approximately 4 mm medial cortical step-off and 7 mm posterior cortical step-off compared to 14 mm medial cortical step-off and 23 mm posterior cortical step-off previously. Partial visualization of more remote total left knee arthroplasty. Mild left femoroacetabular osteoarthritis. IMPRESSION: New lateral plate and screw fixation of the mid  to distal left femur, spanning the previously seen comminuted and displaced distal left femoral fracture. Improved alignment. No evidence of hardware failure. Electronically Signed   By: Neita Garnet M.D.   On: 09/02/2022 14:30   DG FEMUR MIN 2 VIEWS LEFT  Result Date: 09/02/2022 CLINICAL DATA:  ORIF for distal femur fracture. EXAM: LEFT FEMUR 2 VIEWS COMPARISON:  None Available. FINDINGS: 7 intraoperative spot fluoro films obtained during plate and screw fixation for comminuted fracture of the distal femoral metaphysis. Patient is status post tricompartmental knee replacement. IMPRESSION: Intraoperative assessment during ORIF for comminuted fracture of the distal femoral metaphysis. Electronically Signed   By: Kennith Center M.D.   On: 09/02/2022 10:59   DG C-Arm 1-60 Min-No Report  Result Date: 09/02/2022 Fluoroscopy was utilized by the requesting physician.  No radiographic interpretation.    Assessment/Plan: Principal Problem:   Other fracture of left femur, initial  encounter for closed fracture (HCC)  1 Day Post-Op s/p Procedure(s): OPEN REDUCTION INTERNAL FIXATION (ORIF) DISTAL FEMUR FRACTURE ABLA: hbg dropped to 10.3, hemodynamically stable. Will start Lovenox.  Weightbearing: WBAT LLE Insicional and dressing care: Reinforce dressings as needed VTE prophylaxis: Lovenox while inpatient, will discharge on oral DOAC Pain control: continue current regimen Follow - up plan: w/ Dr. Jena Gauss Dispo: pending PT/OT evals   Armida Sans 09/03/2022, 10:46 AM

## 2022-09-03 NOTE — Evaluation (Signed)
Physical Therapy Evaluation Patient Details Name: Mandy Hanson MRN: [id number] DOB: 12/05/1950 Today's Date: 09/03/2022  History of Present Illness  72 year old who was transferred to [facility] from [facility] after she fell walking on a dock and ultimately suffered a left femoral periprosthetic fracture. She is s/p L femur ORIF on 09/02/22. PMH of asthma, GERD, and OA  Clinical Impression  Pt admitted with/for distal femur fx on the L s/p ORIF.  Pt needing min to CGA for basic mobility and gait.  Pt currently limited functionally due to the problems listed below.  (see problems list.)  Pt will benefit from PT to maximize function and safety to be able to get home safely with available assist.         If plan is discharge home, recommend the following: A little help with walking and/or transfers;A little help with bathing/dressing/bathroom;Assistance with cooking/housework;Assist for transportation   Can travel by private vehicle        Equipment Recommendations None recommended by PT  Recommendations for Other Services       Functional Status Assessment Patient has had a recent decline in their functional status and demonstrates the ability to make significant improvements in function in a reasonable and predictable amount of time.     Precautions / Restrictions Precautions Precautions: Fall Restrictions Weight Bearing Restrictions: Yes LLE Weight Bearing: Weight bearing as tolerated      Mobility  Bed Mobility Overal bed mobility: Needs Assistance Bed Mobility: Sit to Supine       Sit to supine: Min assist        Transfers Overall transfer level: Needs assistance   Transfers: Sit to/from Stand Sit to Stand: Min assist           General transfer comment: cues for hand placement/general safety    Ambulation/Gait Ambulation/Gait assistance: Contact guard assist Gait Distance (Feet): 20 Feet Assistive device: Rolling walker (2 wheels) Gait  Pattern/deviations: Step-to pattern   Gait velocity interpretation: <1.31 ft/sec, indicative of household ambulator   General Gait Details: stable step to pattern, slow, but steady.  Stairs            Wheelchair Mobility     Tilt Bed    Modified Rankin (Stroke Patients Only)       Balance Overall balance assessment: Needs assistance Sitting-balance support: Feet supported, No upper extremity supported Sitting balance-Leahy Scale: Good     Standing balance support: Single extremity supported, No upper extremity supported, During functional activity Standing balance-Leahy Scale: Fair                               Pertinent Vitals/Pain Pain Assessment Pain Assessment: 0-10 Pain Score: 5  Pain Location: LLE with mobility Pain Descriptors / Indicators: Grimacing, Guarding, Discomfort Pain Intervention(s): Premedicated before session, Monitored during session    Home Living Family/patient expects to be discharged to:: Private residence Living Arrangements: Spouse/significant other Available Help at Discharge: Family;Available 24 hours/day Type of Home: House Home Access: Stairs to enter   [employer] of Steps: 2   Home Layout: Two level;Able to live on main level with bedroom/bathroom Home Equipment: Rolling Walker (2 wheels);Cane - single point;Grab bars - toilet Additional Comments: pt reports she does not suspect her husband is physically strong enough to hold her up if needed. retired Clinical cytogeneticist Prior Level of Function : History of Falls (last six months);Independent/Modified Independent  Mobility Comments: ind ADLs Comments: ind     Extremity/Trunk Assessment   Upper Extremity Assessment Upper Extremity Assessment: Overall WFL for tasks assessed    Lower Extremity Assessment Lower Extremity Assessment: LLE deficits/detail LLE Deficits / Details: painful, knee flexion ~60 deg, LLE Coordination:  decreased fine motor    Cervical / Trunk Assessment Cervical / Trunk Assessment: Normal  Communication   Communication Communication: No apparent difficulties  Cognition Arousal: Alert Behavior During Therapy: WFL for tasks assessed/performed Overall Cognitive Status: Within Functional Limits for tasks assessed                                          General Comments      Exercises     Assessment/Plan    PT Assessment Patient needs continued PT services  PT Problem List Decreased range of motion;Decreased activity tolerance;Decreased mobility;Decreased coordination;Decreased knowledge of use of DME;Pain;Decreased strength       PT Treatment Interventions DME instruction;Gait training;Stair training;Functional mobility training;Therapeutic activities;Therapeutic exercise;Patient/family education    PT Goals (Current goals can be found in the Care Plan section)  Acute Rehab PT Goals Patient Stated Goal: Home working toward independent PT Goal Formulation: With patient Time For Goal Achievement: 09/09/22 Potential to Achieve Goals: Good    Frequency Min 1X/week     Co-evaluation               AM-PAC PT "6 Clicks" Mobility  Outcome Measure Help needed turning from your back to your side while in a flat bed without using bedrails?: A Little Help needed moving from lying on your back to sitting on the side of a flat bed without using bedrails?: A Little Help needed moving to and from a bed to a chair (including a wheelchair)?: A Little Help needed standing up from a chair using your arms (e.g., wheelchair or bedside chair)?: A Little Help needed to walk in hospital room?: A Little Help needed climbing 3-5 steps with a railing? : A Lot 6 Click Score: 17    End of Session   Activity Tolerance: Patient limited by pain;Patient tolerated treatment well Patient left: in bed;with call bell/phone within reach Nurse Communication: Mobility status PT  Visit Diagnosis: Other abnormalities of gait and mobility (R26.89);Pain Pain - Right/Left: Left Pain - part of body: Leg (above knee)    Time: 9147-8295 PT Time Calculation (min) (ACUTE ONLY): 27 min   Charges:   PT Evaluation $PT Eval Low Complexity: 1 Low PT Treatments $Gait Training: 8-22 mins PT General Charges $$ ACUTE PT VISIT: 1 Visit         09/03/2022  Jacinto Halim., PT Acute Rehabilitation Services [phone]  (office)  Eliseo Gum Guenevere Roorda 09/03/2022, 2:18 PM

## 2022-09-03 NOTE — Plan of Care (Signed)
  Problem: Pain Managment: Goal: General experience of comfort will improve 09/03/2022 0516 by Shanon Rosser, RN Outcome: Progressing 09/03/2022 0516 by Shanon Rosser, RN Outcome: Progressing   Problem: Safety: Goal: Ability to remain free from injury will improve 09/03/2022 0516 by Shanon Rosser, RN Outcome: Progressing 09/03/2022 0516 by Shanon Rosser, RN Outcome: Progressing   Problem: Skin Integrity: Goal: Risk for impaired skin integrity will decrease Outcome: Progressing

## 2022-09-03 NOTE — Progress Notes (Signed)
Occupational Therapy Evaluation Patient Details Name: Mandy Hanson MRN: [id number] DOB: May 15, 1950 Today's Date: 09/03/2022   History of Present Illness 72 year old who was transferred to [facility] from [facility] after she fell walking on a dock and ultimately suffered a left femoral periprosthetic fracture. She is s/p L femur ORIF on 09/02/22. PMH of asthma, GERD, and OA   Clinical Impression   Pt admitted for above, PTA she reports being independent in mobility and ADLs. Pt currently needs min to mod A for bed mobility and min A for functional pivot transfers, she also requires assist with LB ADLs and cannot tolerate much weight on LLE at this time. Pt would benefit from continued acute skilled OT services to address deficits and educate on AE prn. Patient would benefit from post acute Home OT services to help maximize functional independence in natural environment        If plan is discharge home, recommend the following: A lot of help with bathing/dressing/bathroom;A little help with walking and/or transfers;Assistance with cooking/housework;Help with stairs or ramp for entrance;Assist for transportation    Functional Status Assessment  Patient has had a recent decline in their functional status and demonstrates the ability to make significant improvements in function in a reasonable and predictable amount of time.  Equipment Recommendations  BSC/3in1    Recommendations for Other Services       Precautions / Restrictions Precautions Precautions: Fall Restrictions Weight Bearing Restrictions: Yes LLE Weight Bearing: Weight bearing as tolerated      Mobility Bed Mobility Overal bed mobility: Needs Assistance Bed Mobility: Rolling, Sidelying to Sit Rolling: Min assist Sidelying to sit: Mod assist       General bed mobility comments: pt left sitting in recliner    Transfers Overall transfer level: Needs assistance Equipment used: Rolling walker (2  wheels) Transfers: Sit to/from Stand, Bed to chair/wheelchair/BSC Sit to Stand: Min assist     Step pivot transfers: Min assist     General transfer comment: increased time to rise, cues for upright posture and RW safety      Balance Overall balance assessment: Needs assistance Sitting-balance support: Feet supported, No upper extremity supported Sitting balance-Leahy Scale: Good     Standing balance support: Single extremity supported, No upper extremity supported, During functional activity Standing balance-Leahy Scale: Poor                             ADL either performed or assessed with clinical judgement   ADL Overall ADL's : Needs assistance/impaired Eating/Feeding: Independent;Sitting   Grooming: Sitting;Set up   Upper Body Bathing: Independent;Sitting   Lower Body Bathing: Maximal assistance;Sitting/lateral leans   Upper Body Dressing : Independent;Sitting   Lower Body Dressing: Maximal assistance;Sitting/lateral leans   Toilet Transfer: Stand-pivot;Rolling walker (2 wheels);Moderate assistance   Toileting- Clothing Manipulation and Hygiene: Sit to/from stand;Minimal assistance               Vision         Perception         Praxis         Pertinent Vitals/Pain Pain Assessment Pain Assessment: 0-10 Pain Score: 8  Pain Location: LLE with mobility Pain Descriptors / Indicators: Grimacing, Guarding, Discomfort Pain Intervention(s): Limited activity within patient's tolerance, Monitored during session, Repositioned     Extremity/Trunk Assessment Upper Extremity Assessment Upper Extremity Assessment: Overall WFL for tasks assessed   Lower Extremity Assessment Lower Extremity Assessment: LLE deficits/detail LLE  Deficits / Details: painful, knee flexion ~60 deg, LLE Coordination: decreased fine motor   Cervical / Trunk Assessment Cervical / Trunk Assessment: Normal   Communication Communication Communication: No apparent  difficulties   Cognition Arousal: Alert Behavior During Therapy: WFL for tasks assessed/performed Overall Cognitive Status: Within Functional Limits for tasks assessed                                       General Comments  BP 119/75 seated EOB, 141/58 following mobility, 121/56 seated in recliner at end of session    Exercises     Shoulder Instructions      Home Living Family/patient expects to be discharged to:: Private residence Living Arrangements: Spouse/significant other Available Help at Discharge: Family;Available 24 hours/day Type of Home: House Home Access: Stairs to enter [employer] of Steps: 2   Home Layout: Two level;Able to live on main level with bedroom/bathroom     Bathroom Shower/Tub: Producer, television/film/video: Handicapped height Bathroom Accessibility: Yes How Accessible: Accessible via walker Home Equipment: Rolling Walker (2 wheels);Cane - single point;Grab bars - toilet   Additional Comments: pt reports she does not suspect her husband is physically strong enough to hold her up if needed. retired Clinical research associate      Prior Functioning/Environment Prior Level of Function : History of Falls (last six months);Independent/Modified Independent             Mobility Comments: ind ADLs Comments: ind        OT Problem List: Impaired balance (sitting and/or standing);Pain      OT Treatment/Interventions: Self-care/ADL training;Balance training;Therapeutic exercise;Therapeutic activities;Patient/family education;DME and/or AE instruction    OT Goals(Current goals can be found in the care plan section) Acute Rehab OT Goals Patient Stated Goal: To get back to walking OT Goal Formulation: With patient Time For Goal Achievement: 09/16/22 Potential to Achieve Goals: Good ADL Goals Pt Will Perform Grooming: with contact guard assist;standing Pt Will Perform Lower Body Bathing: with set-up;with supervision;with adaptive  equipment;sitting/lateral leans Pt Will Perform Lower Body Dressing: with set-up;with supervision;sitting/lateral leans;with adaptive equipment Pt Will Transfer to Toilet: stand pivot transfer;with contact guard assist  OT Frequency: Min 1X/week    Co-evaluation              AM-PAC OT "6 Clicks" Daily Activity     Outcome Measure Help from another person eating meals?: None Help from another person taking care of personal grooming?: A Little Help from another person toileting, which includes using toliet, bedpan, or urinal?: A Lot Help from another person bathing (including washing, rinsing, drying)?: A Lot Help from another person to put on and taking off regular upper body clothing?: None Help from another person to put on and taking off regular lower body clothing?: A Lot 6 Click Score: 17   End of Session Equipment Utilized During Treatment: Gait belt;Rolling walker (2 wheels) Nurse Communication: Mobility status  Activity Tolerance: Patient tolerated treatment well Patient left: in chair;with call bell/phone within reach;with family/visitor present  OT Visit Diagnosis: Unsteadiness on feet (R26.81);Other abnormalities of gait and mobility (R26.89);History of falling (Z91.81);Pain Pain - Right/Left: Left Pain - part of body: Leg                Time: 6045-4098 OT Time Calculation (min): 38 min Charges:  OT General Charges $OT Visit: 1 Visit OT Evaluation $OT Eval Moderate Complexity: 1 Mod  OT Treatments $Therapeutic Activity: 23-37 mins  09/03/2022  AB, OTR/L  Acute Rehabilitation Services  Office: [phone]   Tristan Schroeder 09/03/2022, 2:41 PM

## 2022-09-04 DIAGNOSIS — S728X2A Other fracture of left femur, initial encounter for closed fracture: Secondary | ICD-10-CM | POA: Diagnosis not present

## 2022-09-04 LAB — BASIC METABOLIC PANEL
Anion gap: 7 (ref 5–15)
BUN: 8 mg/dL (ref 8–23)
CO2: 25 mmol/L (ref 22–32)
Calcium: 8.2 mg/dL — ABNORMAL LOW (ref 8.9–10.3)
Chloride: 101 mmol/L (ref 98–111)
Creatinine, Ser: 0.77 mg/dL (ref 0.44–1.00)
GFR, Estimated: >60 mL/min (ref >60)
Glucose, Bld: 98 mg/dL (ref 70–99)
Potassium: 3.8 mmol/L (ref 3.5–5.1)
Sodium: 133 mmol/L — ABNORMAL LOW (ref 135–145)

## 2022-09-04 LAB — CBC
HCT: 29.9 % — ABNORMAL LOW (ref 36.0–46.0)
Hemoglobin: 10.1 g/dL — ABNORMAL LOW (ref 12.0–15.0)
MCH: 33.2 pg (ref 26.0–34.0)
MCHC: 33.8 g/dL (ref 30.0–36.0)
MCV: 98.4 fL (ref 80.0–100.0)
Platelets: 280 10*3/uL (ref 150–400)
RBC: 3.04 MIL/uL — ABNORMAL LOW (ref 3.87–5.11)
RDW: 13.1 % (ref 11.5–15.5)
WBC: 6 10*3/uL (ref 4.0–10.5)
nRBC: 0 % (ref 0.0–0.2)

## 2022-09-04 MED ORDER — APIXABAN 2.5 MG PO TABS: 2.5000 mg | ORAL_TABLET | Freq: Two times a day (BID) | ORAL | 0 refills | Status: AC

## 2022-09-04 MED ORDER — HYDROCODONE-ACETAMINOPHEN 5-325 MG PO TABS
1.0000 | ORAL_TABLET | ORAL | 0 refills | Status: AC | PRN
Start: 2022-09-04 — End: 2022-09-11

## 2022-09-04 NOTE — Progress Notes (Signed)
     Subjective: 2 Days Post-Op s/p Procedure(s): OPEN REDUCTION INTERNAL FIXATION (ORIF) DISTAL FEMUR FRACTURE  Patient alert and oriented.  Slept okay other than clinical interruptions.  Feels pain is well managed.  Walked 20 ft with PT yesterday. Motivated for PT today.  Denies chest pain, SOB, Calf pain, N/V. No other complaints.    Objective:  PE: VITALS:   Vitals:   09/03/22 1552 09/03/22 1950 09/04/22 0516 09/04/22 0723  BP: (!) 101/42 (!) 115/47 (!) 115/54 (!) 123/53  Pulse: 89 (!) 107 77 83  Resp: 18 18 18 18   Temp: (!) 97.5 F (36.4 C) 97.8 F (36.6 C) 98.1 F (36.7 C) 98.1 F (36.7 C)  TempSrc: Oral Oral Oral Oral  SpO2: 100% 99% 99% 99%  Weight:   80.4 kg   Height:        General: sitting up in bed, in no acute distress Resp: normal respiratory effort MSK: Incisions CDI. TTP to distal femur.   Sens DPN, SPN, TN intact             Motor EHL, ext, flex, evers 5/5             DP 2+, PT 2+, No significant edema   LABS  Results for orders placed or performed during the hospital encounter of 09/01/22 (from the past 24 hour(s))  CBC     Status: Abnormal   Collection Time: 09/04/22  5:59 AM  Result Value Ref Range   WBC 6.0 4.0 - 10.5 K/uL   RBC 3.04 (L) 3.87 - 5.11 MIL/uL   Hemoglobin 10.1 (L) 12.0 - 15.0 g/dL   HCT 16.1 (L) 09.6 - 04.5 %   MCV 98.4 80.0 - 100.0 fL   MCH 33.2 26.0 - 34.0 pg   MCHC 33.8 30.0 - 36.0 g/dL   RDW 40.9 81.1 - 91.4 %   Platelets 280 150 - 400 K/uL   nRBC 0.0 0.0 - 0.2 %    X-ray: stable post-operative imaging   Assessment/Plan: Principal Problem:   Other fracture of left femur, initial encounter for closed fracture (HCC)  2 Days Post-Op s/p Procedure(s): OPEN REDUCTION INTERNAL FIXATION (ORIF) DISTAL FEMUR FRACTURE  HGB  10.1* (09/01 0559), generally stable  Weightbearing: WBAT LLE Insicional and dressing care: Reinforce dressings as needed VTE prophylaxis: Lovenox while inpatient, will discharge on oral DOAC Pain  control: continue current regimen Follow - up plan: w/ Dr. Jena Gauss Dispo: per medicine pending PT/OT evals, okay to DC from orthopedic standpoint   Cecil Cobbs 09/04/2022, 7:32 AM

## 2022-09-04 NOTE — Plan of Care (Signed)
  Problem: Pain Managment: Goal: General experience of comfort will improve Outcome: Progressing   Problem: Safety: Goal: Ability to remain free from injury will improve Outcome: Progressing   Problem: Skin Integrity: Goal: Risk for impaired skin integrity will decrease Outcome: Progressing   

## 2022-09-04 NOTE — TOC Initial Note (Signed)
Transition of Care [facility]) - Initial/Assessment Note    Patient Details  Name: Mandy Hanson MRN: [id number] Date of Birth: 03-Mar-1950  [facility]) CM/SW Contact:    Lawerance Sabal, RN Phone Number: 09/04/2022, 11:05 AM  Clinical Narrative:                  Sherron Monday w patient and spouse at bedside.  Patient identifies that she will need a RW and a bedside commode. These have been ordered today to be brought to her room through [employer].  Patient would like to have HH PT OT as suggested by therapies.  She has no preference for provider and referral has been accepted by [facility].  Patient will need HH PT OT orders     Expected Discharge Plan: Home w Home Health Services Barriers to Discharge: Continued Medical Work up   Patient Goals and CMS Choice Patient states their goals for this hospitalization and ongoing recovery are:: to go home CMS Medicare.gov Compare Post Acute Care list provided to:: Patient Choice offered to / list presented to : Patient      Expected Discharge Plan and Services   Discharge Planning Services: CM Consult Post Acute Care Choice: Home Health, Durable Medical Equipment Living arrangements for the past 2 months: Single Family Home                 DME Arranged: Bedside commode, Walker rolling DME Agency: [employer] Date DME Agency Contacted: 09/04/22 Time DME Agency Contacted: 1105 Representative spoke with at DME Agency: Vaughan Basta HH Arranged: PT, OT HH Agency: [facility] ([facility]) Date HH Agency Contacted: 09/04/22 Time HH Agency Contacted: 1105 Representative spoke with at [facility] Agency: Research scientist (physical sciences)  Prior Living Arrangements/Services Living arrangements for the past 2 months: Single Family Home Lives with:: Spouse                   Activities of Daily Living Home Assistive Devices/Equipment: None ADL Screening (condition at time of admission) Patient's cognitive ability adequate to safely complete daily  activities?: Yes Is the patient deaf or have difficulty hearing?: No Does the patient have difficulty seeing, even when wearing glasses/contacts?: No Does the patient have difficulty concentrating, remembering, or making decisions?: No Patient able to express need for assistance with ADLs?: Yes Does the patient have difficulty dressing or bathing?: No Independently performs ADLs?: Yes (appropriate for developmental age) Does the patient have difficulty walking or climbing stairs?: No Weakness of Legs: None Weakness of Arms/Hands: None  Permission Sought/Granted                  Emotional Assessment              Admission diagnosis:  Other fracture of left femur, initial encounter for closed fracture (HCC) [S72.8X2A] Closed fracture of left femur, unspecified fracture morphology, unspecified portion of femur, initial encounter (HCC) [S72.92XA] Patient Active Problem List   Diagnosis Date Noted   Other fracture of left femur, initial encounter for closed fracture (HCC) 09/01/2022   OA (osteoarthritis) of knee 02/19/2018   Allergic contact dermatitis 02/07/2018   Osteoarthritis of knees, 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   Transaminitis 01/20/2012   Epigastric abdominal pain 01/20/2012   Nausea and vomiting in adult 01/18/2012   Hypokalemia 01/18/2012   Diverticulitis of colon (without mention of hemorrhage)(562.11) 01/14/2012   Hyponatremia 01/14/2012   GERD (gastroesophageal reflux disease) 01/14/2012   PCP:  Merri Brunette, MD Pharmacy:   [id number] [place] - [address] AT [facility] [address] [facility] [address] [place] [address] Phone: [phone] Fax: [phone]     Social Determinants of Health (SDOH) Social History: SDOH Screenings   Food Insecurity: No Food Insecurity (09/01/2022)  Housing: Low Risk  (09/01/2022)  Transportation Needs: No Transportation Needs (09/01/2022)  Utilities: Not At Risk (09/01/2022)  Tobacco Use: Low  Risk  (09/02/2022)   SDOH Interventions:     Readmission Risk Interventions     No data to display

## 2022-09-04 NOTE — Progress Notes (Signed)
Physical Therapy Treatment Patient Details Name: Mandy Hanson MRN: [id number] DOB: 1950/02/13 Today's Date: 09/04/2022   History of Present Illness 72 year old who was transferred to [facility] from [facility] after she fell walking on a dock and ultimately suffered a left femoral periprosthetic fracture. She is s/p L femur ORIF on 09/02/22. PMH of asthma, GERD, and OA    PT Comments  Continuing work on functional mobility and activity tolerance;  Session focused on progressive amb with solid progress; Painful during amb, but was able to have IV pain meds, which helped a lot; Husband present and helpful during session; Pt's biggest goal is to have her pain control regiment dialed in well before going home    If plan is discharge home, recommend the following: A little help with walking and/or transfers;A little help with bathing/dressing/bathroom;Assistance with cooking/housework;Assist for transportation   Can travel by private vehicle        Equipment Recommendations  None recommended by PT    Recommendations for Other Services       Precautions / Restrictions Precautions Precautions: Fall Restrictions Weight Bearing Restrictions: No LLE Weight Bearing: Weight bearing as tolerated     Mobility  Bed Mobility               General bed mobility comments: In recliner upon arrival    Transfers Overall transfer level: Needs assistance Equipment used: Rolling walker (2 wheels) Transfers: Sit to/from Stand Sit to Stand: Contact guard assist           General transfer comment: increased time to rise, cues for upright posture and RW safety    Ambulation/Gait Ambulation/Gait assistance: Contact guard assist Gait Distance (Feet): 55 Feet Assistive device: Rolling walker (2 wheels) Gait Pattern/deviations: Step-to pattern       General Gait Details: stable step to pattern, slow, but steady; husband assisted with chair follow to boost pt's  confidence   Stairs             Wheelchair Mobility     Tilt Bed    Modified Rankin (Stroke Patients Only)       Balance     Sitting balance-Leahy Scale: Good       Standing balance-Leahy Scale: Poor                              Cognition Arousal: Alert Behavior During Therapy: WFL for tasks assessed/performed Overall Cognitive Status: Within Functional Limits for tasks assessed                                          Exercises Total Joint Exercises Ankle Circles/Pumps: AROM, Both, Seated Quad Sets: AROM, Left, 10 reps, Seated    General Comments General comments (skin integrity, edema, etc.): Had IV pain meds during session, and overall that helped -- opted to sit when pt reported feeling dizzy and hot      Pertinent Vitals/Pain Pain Assessment Pain Assessment: 0-10 Pain Score: 8  Pain Location: LLE with mobility Pain Descriptors / Indicators: Grimacing, Guarding, Discomfort Pain Intervention(s): Monitored during session, Patient requesting pain meds-RN notified, RN gave pain meds during session    Home Living                          Prior Function  PT Goals (current goals can now be found in the care plan section) Acute Rehab PT Goals Patient Stated Goal: Home working toward independent PT Goal Formulation: With patient Time For Goal Achievement: 09/09/22 Potential to Achieve Goals: Good Progress towards PT goals: Progressing toward goals    Frequency    Min 1X/week      PT Plan      Co-evaluation              AM-PAC PT "6 Clicks" Mobility   Outcome Measure  Help needed turning from your back to your side while in a flat bed without using bedrails?: A Little Help needed moving from lying on your back to sitting on the side of a flat bed without using bedrails?: A Little Help needed moving to and from a bed to a chair (including a wheelchair)?: A Little Help needed  standing up from a chair using your arms (e.g., wheelchair or bedside chair)?: A Little Help needed to walk in hospital room?: A Little Help needed climbing 3-5 steps with a railing? : A Lot 6 Click Score: 17    End of Session Equipment Utilized During Treatment: Gait belt Activity Tolerance: Patient tolerated treatment well Patient left: in chair;with call bell/phone within reach;with family/visitor present Nurse Communication: Mobility status PT Visit Diagnosis: Other abnormalities of gait and mobility (R26.89);Pain Pain - Right/Left: Left Pain - part of body: Leg (above knee)     Time: 6045-4098 PT Time Calculation (min) (ACUTE ONLY): 42 min  Charges:    $Gait Training: 23-37 mins $Therapeutic Activity: 8-22 mins                       Van Clines, PT  Acute Rehabilitation Services Office [phone] Secure Chat welcomed    Levi Aland 09/04/2022, 3:27 PM

## 2022-09-04 NOTE — Progress Notes (Signed)
Mandy Hanson  WUJ:[id number] DOB: [birth year] DOA: 09/01/2022 PCP: Merri Brunette, MD    Brief Narrative:  72 year old with a history of asthma, GERD, and OA who was transferred to [facility] from [facility] after she fell walking on a dock and ultimately suffered a left femoral periprosthetic fracture.  The fall appeared to clearly be mechanical in nature and the patient was without complaint other than the expected pain from this significant fracture.  Vital signs were stable and she was afebrile at presentation.  Goals of Care:   Code Status: Full Code   DVT prophylaxis: enoxaparin (LOVENOX) injection 40 mg Start: 09/03/22 0800 SCDs Start: 09/02/22 1111  Interim Hx: Medically stable.  No acute events overnight.  Overall doing well but reports severe pain with even limited mobility which is making it very difficult for her to manage ADLs.  No chest pain or shortness of breath.  Assessment & Plan:  Comminuted and displaced fracture of the distal left femur just above previous knee arthroplasty Care per Orthopedic Trauma team - to OR 8/30 for open reduction internal fixation of left supracondylar distal femur fracture - cleared to be weightbearing as tolerated -Lovenox for DVT prophylaxis while hospitalized with plan to discharge on DOAC - PT/OT recommend home health which has been ordered -anticipate discharge home in next 24-48 hours, when exertional tolerance improved  Asthma well-controlled and stable at present  GERD Continue PPI  Mild hyponatremia Appears to be chronic in nature with review of sodium over last 10 years revealing baseline range of 129-133 -sodium has been stable during this hospital stay   Family Communication: Spoke with husband at bedside Disposition: Medically stable for disposition at discretion of orthopedic team   Objective: Blood pressure (!) 123/53, pulse 83, temperature 98.1 F (36.7 C), temperature source Oral, resp. rate 18, height 5'  10" (1.778 m), weight 80.4 kg, SpO2 99%.  Intake/Output Summary (Last 24 hours) at 09/04/2022 0934 Last data filed at 09/04/2022 0535 Gross per 24 hour  Intake 440 ml  Output 1400 ml  Net -960 ml   Filed Weights   09/01/22 1309 09/04/22 0516  Weight: 79.4 kg 80.4 kg    Examination: General: No acute respiratory distress Lungs: Clear to auscultation bilaterally  Cardiovascular: Regular rate and rhythm Abdomen: NT/ND, soft, BS positive Extremities: No signif edema bilateral lower extremities  CBC: Recent Labs  Lab 09/01/22 0828 09/03/22 0321 09/04/22 0559  WBC 5.9 8.1 6.0  NEUTROABS 5.0  --   --   HGB 12.1 10.3* 10.1*  HCT 35.4* 30.5* 29.9*  MCV 99.4 99.7 98.4  PLT 228 235 280   Basic Metabolic Panel: Recent Labs  Lab 09/01/22 0828 09/03/22 0321 09/04/22 0559  NA 127* 132* 133*  K 3.7 3.6 3.8  CL 97* 97* 101  CO2 20* 25 25  GLUCOSE 110* 137* 98  BUN 5* 7* 8  CREATININE 0.66 0.66 0.77  CALCIUM 7.7* 8.8* 8.2*   GFR: Estimated Creatinine Clearance: 69.7 mL/min (by C-G formula based on SCr of 0.77 mg/dL).   Scheduled Meds:  azelastine  1 spray Each Nare BID   docusate sodium  100 mg Oral BID   enoxaparin (LOVENOX) injection  40 mg Subcutaneous Q24H   feeding supplement  237 mL Oral BID BM   fluticasone  2 spray Each Nare BID   lubiprostone  24 mcg Oral BID WC   mometasone-formoterol  2 puff Inhalation BID   montelukast  10 mg Oral QHS   pantoprazole  40 mg Oral Daily   Continuous Infusions:  methocarbamol (ROBAXIN) IV       LOS: 3 days   Lonia Blood, MD [facility] Office  [phone] Pager - Text Page per Amion  If 7PM-7AM, please contact night-coverage per Amion 09/04/2022, 9:34 AM

## 2022-09-05 DIAGNOSIS — S728X2A Other fracture of left femur, initial encounter for closed fracture: Secondary | ICD-10-CM | POA: Diagnosis not present

## 2022-09-05 MED ORDER — SENNOSIDES-DOCUSATE SODIUM 8.6-50 MG PO TABS
1.0000 | ORAL_TABLET | Freq: Two times a day (BID) | ORAL | Status: DC
  Administered 2022-09-05 (×2): 1 via ORAL
  Filled 2022-09-05 (×3): qty 1

## 2022-09-05 MED ORDER — POLYETHYLENE GLYCOL 3350 17 G PO PACK: 17.0000 g | PACK | Freq: Two times a day (BID) | ORAL | Status: AC

## 2022-09-05 MED ORDER — IBUPROFEN 800 MG PO TABS: 800.0000 mg | ORAL_TABLET | Freq: Three times a day (TID) | ORAL | Status: AC | PRN

## 2022-09-05 MED ORDER — LACTULOSE 10 GM/15ML PO SOLN
20.0000 g | Freq: Once | ORAL | Status: AC
  Administered 2022-09-05: 20 g via ORAL
  Filled 2022-09-05: qty 30

## 2022-09-05 MED ORDER — OXYCODONE HCL 5 MG PO TABS: 5.0000 mg | ORAL_TABLET | ORAL | 0 refills | Status: AC | PRN

## 2022-09-05 MED ORDER — IBUPROFEN 200 MG PO TABS
800.0000 mg | ORAL_TABLET | Freq: Three times a day (TID) | ORAL | Status: DC | PRN
  Administered 2022-09-06: 800 mg via ORAL
  Filled 2022-09-05: qty 4

## 2022-09-05 MED ORDER — ACETAMINOPHEN 325 MG PO TABS
650.0000 mg | ORAL_TABLET | Freq: Four times a day (QID) | ORAL | Status: DC
  Administered 2022-09-05 – 2022-09-06 (×3): 650 mg via ORAL
  Filled 2022-09-05 (×3): qty 2

## 2022-09-05 MED ORDER — MORPHINE SULFATE (PF) 2 MG/ML IV SOLN
1.0000 mg | INTRAVENOUS | Status: DC | PRN
  Administered 2022-09-05: 1 mg via INTRAVENOUS
  Filled 2022-09-05: qty 1

## 2022-09-05 MED ORDER — BISACODYL 10 MG RE SUPP: 10.0000 mg | Freq: Every day | RECTAL | Status: DC | PRN

## 2022-09-05 MED ORDER — POLYETHYLENE GLYCOL 3350 17 G PO PACK
17.0000 g | PACK | Freq: Two times a day (BID) | ORAL | Status: DC
  Administered 2022-09-05 – 2022-09-06 (×2): 17 g via ORAL
  Filled 2022-09-05 (×2): qty 1

## 2022-09-05 MED ORDER — OXYCODONE HCL 5 MG PO TABS
5.0000 mg | ORAL_TABLET | ORAL | Status: DC | PRN
  Administered 2022-09-05 – 2022-09-06 (×5): 10 mg via ORAL
  Filled 2022-09-05 (×5): qty 2

## 2022-09-05 MED ORDER — MAGNESIUM CITRATE PO SOLN
1.0000 | Freq: Once | ORAL | Status: AC
  Administered 2022-09-05: 1 via ORAL
  Filled 2022-09-05: qty 296

## 2022-09-05 NOTE — Progress Notes (Addendum)
     Subjective: 3 Days Post-Op s/p Procedure(s): OPEN REDUCTION INTERNAL FIXATION (ORIF) DISTAL FEMUR FRACTURE  Patient alert and oriented.  Sitting in chair, about to mobilize with PT.  Slept okay. Feels pain is well managed.  Walked 55 ft with PT yesterday. Is hoping to stay one more day to better mobilize.  Denies chest pain, SOB, Calf pain, N/V.  No other complaints.  Pt asking to have HHPT for first 1-2 weeks post-operatively.    Objective:  PE: VITALS:   Vitals:   09/04/22 1931 09/05/22 0449 09/05/22 0625 09/05/22 0718  BP: (!) 106/50 (!) 114/52  (!) 112/45  Pulse: 83 76  93  Resp: 18 18  18   Temp: 97.6 F (36.4 C) (!) 97.5 F (36.4 C)  98 F (36.7 C)  TempSrc: Oral Oral  Oral  SpO2: 100% 100%  100%  Weight:   91.8 kg   Height:        General: sitting up in bed, in no acute distress Resp: normal respiratory effort MSK: Incisions CDI. TTP to distal femur.   Sens DPN, SPN, TN intact             Motor EHL, ext, flex, evers 5/5             DP 2+, PT 2+, No significant edema   LABS  No results found for this or any previous visit (from the past 24 hour(s)).   X-ray: stable post-operative imaging   Assessment/Plan: Principal Problem:   Other fracture of left femur, initial encounter for closed fracture (HCC)  3 Days Post-Op s/p Procedure(s): OPEN REDUCTION INTERNAL FIXATION (ORIF) DISTAL FEMUR FRACTURE  HGB  10.1* (09/01 0559), generally stable  Weightbearing: WBAT LLE Insicional and dressing care: Reinforce dressings as needed VTE prophylaxis: Lovenox while inpatient, will discharge on oral DOAC Pain control: continue current regimen Follow - up plan: w/ Dr. Jena Gauss Dispo: per medicine pending PT/OT evals, okay to DC from orthopedic standpoint   Cecil Cobbs 09/05/2022, 10:04 AM

## 2022-09-05 NOTE — Progress Notes (Signed)
Occupational Therapy Treatment Patient Details Name: Mandy Hanson MRN: [id number] DOB: 08-Jun-1950 Today's Date: 09/05/2022   History of present illness 72 year old who was transferred to [facility] from [facility] after she fell walking on a dock and ultimately suffered a left femoral periprosthetic fracture. She is s/p L femur ORIF on 09/02/22. PMH of asthma, GERD, and OA   OT comments  Pt progressing well toward established OT goals. Focus session on ADL retraining and mobility. Educated and performing LB ADL with CGA and shower transfer with min A for steadying of RW with step over small lip of shower. Will continue to follow acutely. Pt intermittently needing min A for steadying during transfers, thus recommendation of HHOT continues to be appropriate.       If plan is discharge home, recommend the following:  A little help with walking and/or transfers;Assistance with cooking/housework;Help with stairs or ramp for entrance;Assist for transportation;A little help with bathing/dressing/bathroom   Equipment Recommendations  BSC/3in1    Recommendations for Other Services      Precautions / Restrictions Precautions Precautions: Fall Restrictions Weight Bearing Restrictions: Yes LLE Weight Bearing: Weight bearing as tolerated       Mobility Bed Mobility Overal bed mobility: Needs Assistance Bed Mobility: Supine to Sit     Supine to sit: Contact guard     General bed mobility comments: increased time and use of BUE to mobilize LLE to EOB    Transfers Overall transfer level: Needs assistance Equipment used: Rolling walker (2 wheels) Transfers: Sit to/from Stand Sit to Stand: Contact guard assist                 Balance Overall balance assessment: Needs assistance Sitting-balance support: Feet supported, No upper extremity supported Sitting balance-Leahy Scale: Good     Standing balance support: Single extremity supported, No upper extremity supported,  During functional activity Standing balance-Leahy Scale: Poor                             ADL either performed or assessed with clinical judgement   ADL Overall ADL's : Needs assistance/impaired Eating/Feeding: Independent;Sitting   Grooming: Wash/dry hands;Standing;Contact guard assist               Lower Body Dressing: Sit to/from stand;Contact guard assist Lower Body Dressing Details (indicate cue type and reason): Pt able to don L and R sock at EOB and thread underpants. Toilet Transfer: Contact guard assist;Ambulation;Rolling walker (2 wheels);BSC/3in1;Minimal assistance Toilet Transfer Details (indicate cue type and reason): very short distance; min A for inital rise; CGA on second rise         Functional mobility during ADLs: Contact guard assist;Rolling walker (2 wheels)      Extremity/Trunk Assessment Upper Extremity Assessment Upper Extremity Assessment: Overall WFL for tasks assessed   Lower Extremity Assessment Lower Extremity Assessment: Defer to PT evaluation        Vision       Perception     Praxis      Cognition Arousal: Alert Behavior During Therapy: WFL for tasks assessed/performed Overall Cognitive Status: Within Functional Limits for tasks assessed                                 General Comments: decreased understanding of healing process reporting she would rather use IV meds acutely as well as purewick although will need to be  on PO meds and able to at least get to and from [facility] to go home.        Exercises      Shoulder Instructions       General Comments poor activity tolerance. Encouraging frequent mobility to toilet and not using purewick if pt can help it. Also reviewed transition over to PO medication only as this is what pt will have at home    Pertinent Vitals/ Pain       Pain Assessment Pain Assessment: Faces Faces Pain Scale: Hurts even more Pain Location: LLE with mobility Pain Descriptors  / Indicators: Grimacing, Guarding, Discomfort Pain Intervention(s): Limited activity within patient's tolerance, Monitored during session  Home Living                                          Prior Functioning/Environment              Frequency  Min 1X/week        Progress Toward Goals  OT Goals(current goals can now be found in the care plan section)  Progress towards OT goals: Progressing toward goals  Acute Rehab OT Goals Patient Stated Goal: get back to walking OT Goal Formulation: With patient Time For Goal Achievement: 09/16/22 Potential to Achieve Goals: Good ADL Goals Pt Will Perform Grooming: with contact guard assist;standing Pt Will Perform Lower Body Bathing: with set-up;with supervision;with adaptive equipment;sitting/lateral leans Pt Will Perform Lower Body Dressing: with set-up;with supervision;sitting/lateral leans;with adaptive equipment Pt Will Transfer to Toilet: stand pivot transfer;with contact guard assist  Plan      Co-evaluation                 AM-PAC OT "6 Clicks" Daily Activity     Outcome Measure   Help from another person eating meals?: None Help from another person taking care of personal grooming?: A Little Help from another person toileting, which includes using toliet, bedpan, or urinal?: A Little Help from another person bathing (including washing, rinsing, drying)?: A Little Help from another person to put on and taking off regular upper body clothing?: None Help from another person to put on and taking off regular lower body clothing?: A Little 6 Click Score: 20    End of Session Equipment Utilized During Treatment: Gait belt;Rolling walker (2 wheels)  OT Visit Diagnosis: Unsteadiness on feet (R26.81);Other abnormalities of gait and mobility (R26.89);History of falling (Z91.81);Pain Pain - Right/Left: Left Pain - part of body: Leg   Activity Tolerance Patient tolerated treatment well   Patient Left  in bed;with call bell/phone within reach   Nurse Communication Mobility status        Time: 2725-3664 OT Time Calculation (min): 31 min  Charges: OT General Charges $OT Visit: 1 Visit OT Treatments $Self Care/Home Management : 23-37 mins  Tyler Deis, OTR/L [facility] Acute Rehabilitation Office: [phone]   Myrla Halsted 09/05/2022, 10:58 AM

## 2022-09-05 NOTE — Plan of Care (Signed)
  Problem: Pain Managment: Goal: General experience of comfort will improve Outcome: Progressing   Problem: Safety: Goal: Ability to remain free from injury will improve Outcome: Progressing   Problem: Skin Integrity: Goal: Risk for impaired skin integrity will decrease Outcome: Progressing   

## 2022-09-05 NOTE — Progress Notes (Signed)
Mandy Hanson  WUJ:[id number] DOB: [birth year] DOA: 09/01/2022 PCP: Merri Brunette, MD    Brief Narrative:  72 year old with a history of asthma, GERD, and OA who was transferred to [facility] from [facility] after she fell walking on a dock and ultimately suffered a left femoral periprosthetic fracture.  The fall appeared to clearly be mechanical in nature and the patient was without complaint other than the expected pain from this significant fracture.  Vital signs were stable and she was afebrile at presentation.  Goals of Care:   Code Status: Full Code   DVT prophylaxis: enoxaparin (LOVENOX) injection 40 mg Start: 09/03/22 0800 SCDs Start: 09/02/22 1111  Interim Hx: No acute events recorded since the time of my last visit.  Afebrile.  Vital signs stable.  No new labs requested for today.  Reports ongoing significant pain with attempts at ambulation making it very difficult for her to attend to her ADLs.  Also reports no bowel movement times 5+ days.  Assessment & Plan:  Comminuted and displaced fracture of the distal left femur just above previous knee arthroplasty Care per Orthopedic Trauma team - to OR 8/30 for open reduction internal fixation of left supracondylar distal femur fracture - cleared to be weightbearing as tolerated -Lovenox for DVT prophylaxis while hospitalized with plan to discharge on DOAC - PT/OT recommend home health which has been ordered   Constipation Chronic history of IBS with constipation now worsened by bed rest as well as narcotics -increase bowel regimen today  Poorly controlled pain Adjust pain medication regimen and attempt to find oral agents that would allow control so patient can be discharged home  Asthma well-controlled and stable during this hospital stay  GERD Continue PPI  Mild hyponatremia Appears to be chronic in nature with review of sodium over last 10 years revealing baseline range of 129-133 -sodium has been stable during  this hospital stay   Family Communication: Spoke with patient and her husband at bedside Disposition: Need to see resolution of constipation and improved pain control before discharge home -hopeful for discharge 9/3   Objective: Blood pressure (!) 112/45, pulse 93, temperature 98 F (36.7 C), temperature source Oral, resp. rate 18, height 5\' 10"  (1.778 m), weight 91.8 kg, SpO2 100%.  Intake/Output Summary (Last 24 hours) at 09/05/2022 0952 Last data filed at 09/05/2022 0626 Gross per 24 hour  Intake --  Output 1850 ml  Net -1850 ml   Filed Weights   09/01/22 1309 09/04/22 0516 09/05/22 0625  Weight: 79.4 kg 80.4 kg 91.8 kg    Examination: General: No acute respiratory distress Lungs: Clear to auscultation bilaterally -no wheezing Cardiovascular: Regular rate and rhythm Abdomen: NT/ND, soft, BS positive Extremities: No signif edema bilateral lower extremities  CBC: Recent Labs  Lab 09/01/22 0828 09/03/22 0321 09/04/22 0559  WBC 5.9 8.1 6.0  NEUTROABS 5.0  --   --   HGB 12.1 10.3* 10.1*  HCT 35.4* 30.5* 29.9*  MCV 99.4 99.7 98.4  PLT 228 235 280   Basic Metabolic Panel: Recent Labs  Lab 09/01/22 0828 09/03/22 0321 09/04/22 0559  NA 127* 132* 133*  K 3.7 3.6 3.8  CL 97* 97* 101  CO2 20* 25 25  GLUCOSE 110* 137* 98  BUN 5* 7* 8  CREATININE 0.66 0.66 0.77  CALCIUM 7.7* 8.8* 8.2*   GFR: Estimated Creatinine Clearance: 79.2 mL/min (by C-G formula based on SCr of 0.77 mg/dL).   Scheduled Meds:  azelastine  1 spray Each Nare  BID   docusate sodium  100 mg Oral BID   enoxaparin (LOVENOX) injection  40 mg Subcutaneous Q24H   feeding supplement  237 mL Oral BID BM   fluticasone  2 spray Each Nare BID   lubiprostone  24 mcg Oral BID WC   mometasone-formoterol  2 puff Inhalation BID   montelukast  10 mg Oral QHS   pantoprazole  40 mg Oral Daily   Continuous Infusions:  methocarbamol (ROBAXIN) IV       LOS: 4 days   Lonia Blood, MD [facility] Office  [phone] Pager - Text Page per Loretha Stapler  If 7PM-7AM, please contact night-coverage per Amion 09/05/2022, 9:52 AM

## 2022-09-05 NOTE — TOC Progression Note (Signed)
Transition of Care [facility]) - Progression Note    Patient Details  Name: Mandy Hanson MRN: [id number] Date of Birth: 18-May-1950  Transition of Care [facility]) CM/SW Contact  Janae Bridgeman, RN Phone Number: 09/05/2022, 11:38 AM  Clinical Narrative:    CM met with the patient and spouse at the bedside and the patient states that she is still having lots of uncontrolled pain and verbalized not having a BM in the last 5 days.  I called [facility] and they accepted the patient for home health services and requested that [facility] RN be added to the orders.  Dr. Sagrario Seller is aware and orders modified.  RW and 3:1 were delivered the patient's hospital room.  The patient will discharge home once medically stable to discharge.  The patient's husband will be providing transportation home by car when medically stable.  Dr. Yeraldi Seller at the bedside.   Expected Discharge Plan: Home w Home Health Services Barriers to Discharge: Continued Medical Work up  Expected Discharge Plan and Services   Discharge Planning Services: CM Consult Post Acute Care Choice: Home Health, Durable Medical Equipment Living arrangements for the past 2 months: Single Family Home                 DME Arranged: Bedside commode, Walker rolling DME Agency: [employer] Date DME Agency Contacted: 09/04/22 Time DME Agency Contacted: 1105 Representative spoke with at DME Agency: Vaughan Basta HH Arranged: PT, OT HH Agency: [facility] Date HH Agency Contacted: 09/04/22 Time HH Agency Contacted: 1105 Representative spoke with at [facility] Agency: Research scientist (physical sciences)   Social Determinants of Health (SDOH) Interventions SDOH Screenings   Food Insecurity: No Food Insecurity (09/01/2022)  Housing: Low Risk  (09/01/2022)  Transportation Needs: No Transportation Needs (09/01/2022)  Utilities: Not At Risk (09/01/2022)  Tobacco Use: Low Risk  (09/02/2022)    Readmission Risk Interventions     No data to display

## 2022-09-05 NOTE — Progress Notes (Signed)
Physical Therapy Treatment Patient Details Name: Mandy Hanson MRN: [id number] DOB: May 12, 1950 Today's Date: 09/05/2022   History of Present Illness 72 year old who was transferred to [facility] from [facility] after she fell walking on a dock and ultimately suffered a left femoral periprosthetic fracture. She is s/p L femur ORIF on 09/02/22. PMH of asthma, GERD, and OA    PT Comments  Pt greeted up in chair and agreeable, however pt continues to be limited by decreased activity tolerance and pain, despite pre-medication and coordination with RN.  Session focused on stair negotiation and gait with RW support for increased activity tolerance. With instruction and demonstration pt able to negotiate 2 steps with RW support and min assist, ascending backwards as pt without rails on steps to enter home. Pt needing step by step cues for sequencing and min A to steady RW during ascend/descent. Pt with poor activity tolerance unable to further progress gait >10' with CGA after stair training and declining further trials after seated rest. Educated pt on importance of continued mobility throughout day, encouraging pt to be up and ambulating to and from bathroom with staff support with pt verbalizing understanding. Current recommendation remains appropriate and pt continues to benefit from skilled PT services to progress toward functional mobility goals.     If plan is discharge home, recommend the following: A little help with walking and/or transfers;A little help with bathing/dressing/bathroom;Assistance with cooking/housework;Assist for transportation   Can travel by private vehicle        Equipment Recommendations  None recommended by PT    Recommendations for Other Services       Precautions / Restrictions Precautions Precautions: Fall Restrictions Weight Bearing Restrictions: Yes LLE Weight Bearing: Weight bearing as tolerated     Mobility  Bed Mobility Overal bed mobility: Needs  Assistance             General bed mobility comments: pt up in chair on arrival    Transfers Overall transfer level: Needs assistance Equipment used: Rolling walker (2 wheels) Transfers: Sit to/from Stand Sit to Stand: Contact guard assist           General transfer comment: increased time to rise, cues for upright posture and RW safety and to place LLE uot in front for pain mitigation    Ambulation/Gait Ambulation/Gait assistance: Contact guard assist Gait Distance (Feet): 10 Feet Assistive device: Rolling walker (2 wheels) Gait Pattern/deviations: Step-to pattern Gait velocity: decr     General Gait Details: stable step to pattern, slow, but steady; short distance for focus on stair training with pt declining further distance   Stairs Stairs: Yes Stairs assistance: Min assist Stair Management: No rails, Step to pattern, Backwards, With walker Number of Stairs: 2 General stair comments: educated and demonstrate ascent/descent backwards with RW as pt with 2 STE without rails, pt able to perform with min A to steady RW and for cues for sequencing   Wheelchair Mobility     Tilt Bed    Modified Rankin (Stroke Patients Only)       Balance Overall balance assessment: Needs assistance Sitting-balance support: Feet supported, No upper extremity supported Sitting balance-Leahy Scale: Good     Standing balance support: Single extremity supported, No upper extremity supported, During functional activity Standing balance-Leahy Scale: Poor Standing balance comment: heavy reliance on RW support                            Cognition  Arousal: Alert Behavior During Therapy: WFL for tasks assessed/performed Overall Cognitive Status: Within Functional Limits for tasks assessed                                          Exercises      General Comments General comments (skin integrity, edema, etc.): poor activity tolerance. Encouraging  frequent mobility to bathrrom in room      Pertinent Vitals/Pain Pain Assessment Pain Assessment: Faces Faces Pain Scale: Hurts even more Pain Location: LLE with mobility Pain Descriptors / Indicators: Grimacing, Guarding, Discomfort Pain Intervention(s): Premedicated before session, Monitored during session, Limited activity within patient's tolerance    Home Living                          Prior Function            PT Goals (current goals can now be found in the care plan section) Acute Rehab PT Goals PT Goal Formulation: With patient Time For Goal Achievement: 09/09/22 Progress towards PT goals: Progressing toward goals    Frequency    Min 1X/week      PT Plan      Co-evaluation              AM-PAC PT "6 Clicks" Mobility   Outcome Measure  Help needed turning from your back to your side while in a flat bed without using bedrails?: A Little Help needed moving from lying on your back to sitting on the side of a flat bed without using bedrails?: A Little Help needed moving to and from a bed to a chair (including a wheelchair)?: A Little Help needed standing up from a chair using your arms (e.g., wheelchair or bedside chair)?: A Little Help needed to walk in hospital room?: A Little Help needed climbing 3-5 steps with a railing? : A Lot 6 Click Score: 17    End of Session Equipment Utilized During Treatment: Gait belt Activity Tolerance: Patient limited by pain;Patient limited by fatigue Patient left: in chair;with call bell/phone within reach;with family/visitor present Nurse Communication: Mobility status PT Visit Diagnosis: Other abnormalities of gait and mobility (R26.89);Pain Pain - Right/Left: Left Pain - part of body: Leg     Time: 7829-5621 PT Time Calculation (min) (ACUTE ONLY): 38 min  Charges:    $Gait Training: 23-37 mins $Therapeutic Activity: 8-22 mins PT General Charges $$ ACUTE PT VISIT: 1 Visit                      Tammi Boulier R. PTA Acute Rehabilitation Services Office: [phone]   Catalina Antigua 09/05/2022, 12:24 PM

## 2022-09-06 DIAGNOSIS — S728X2A Other fracture of left femur, initial encounter for closed fracture: Secondary | ICD-10-CM | POA: Diagnosis not present

## 2022-09-06 MED ORDER — VITAMIN D3 25 MCG PO TABS: 1000.0000 [IU] | ORAL_TABLET | Freq: Every day | ORAL | Status: AC

## 2022-09-06 MED ORDER — METHOCARBAMOL 500 MG PO TABS: 500.0000 mg | ORAL_TABLET | Freq: Four times a day (QID) | ORAL | 0 refills | Status: AC | PRN

## 2022-09-06 NOTE — Progress Notes (Signed)
Physical Therapy Treatment Patient Details Name: Mandy Hanson MRN: [id number] DOB: 03-Nov-1950 Today's Date: 09/06/2022   History of Present Illness 72 yo female transferred to [facility] from [facility] 8/29 after she fell walking on a dock with Lt femoral periprosthetic fracture. She is s/p L femur ORIF on 09/02/22. PMH of asthma, GERD, and OA    PT Comments  Pt pleasant and able to progress gait with cues for sequence and reliance on RW. Pt and spouse educated for HEP, transfers and gait. Pt with sufficient activity for return home. Will continue to follow.      If plan is discharge home, recommend the following: A little help with walking and/or transfers;A little help with bathing/dressing/bathroom;Assistance with cooking/housework;Assist for transportation   Can travel by private vehicle        Equipment Recommendations  None recommended by PT    Recommendations for Other Services       Precautions / Restrictions Precautions Precautions: Fall Restrictions Weight Bearing Restrictions: Yes LLE Weight Bearing: Non weight bearing     Mobility  Bed Mobility Overal bed mobility: Needs Assistance Bed Mobility: Supine to Sit     Supine to sit: Supervision, HOB elevated     General bed mobility comments: HOB 20 degrees with use of UB to move LLE    Transfers Overall transfer level: Needs assistance   Transfers: Sit to/from Stand Sit to Stand: Contact guard assist           General transfer comment: cues for hand placement and extending LLE prior to sitting at toilet and chair    Ambulation/Gait Ambulation/Gait assistance: Contact guard assist Gait Distance (Feet): 110 Feet Assistive device: Rolling walker (2 wheels) Gait Pattern/deviations: Step-to pattern   Gait velocity interpretation: <1.8 ft/sec, indicate of risk for recurrent falls   General Gait Details: cues for looking up, sequence and increased left knee flexion and decreasing circumductin with  gait. Nausea at end of session, improved with smelling alcohol swab   Stairs         General stair comments: pt denied need to practice stairs again   Wheelchair Mobility     Tilt Bed    Modified Rankin (Stroke Patients Only)       Balance Overall balance assessment: Needs assistance Sitting-balance support: Feet supported, No upper extremity supported Sitting balance-Leahy Scale: Good     Standing balance support: No upper extremity supported, Bilateral upper extremity supported Standing balance-Leahy Scale: Fair Standing balance comment: static standing without support, RW for gait                            Cognition Arousal: Alert Behavior During Therapy: WFL for tasks assessed/performed Overall Cognitive Status: Within Functional Limits for tasks assessed                                          Exercises General Exercises - Lower Extremity Heel Slides: AAROM, Left, Supine, 10 reps Hip ABduction/ADduction: AROM, Left, Supine, 10 reps    General Comments        Pertinent Vitals/Pain Pain Assessment Pain Score: 7  Pain Location: LLE with mobility Pain Descriptors / Indicators: Grimacing, Guarding, Discomfort Pain Intervention(s): Limited activity within patient's tolerance, Repositioned, Monitored during session, Premedicated before session    Home Living  Prior Function            PT Goals (current goals can now be found in the care plan section) Progress towards PT goals: Progressing toward goals    Frequency    Min 1X/week      PT Plan      Co-evaluation              AM-PAC PT "6 Clicks" Mobility   Outcome Measure  Help needed turning from your back to your side while in a flat bed without using bedrails?: A Little Help needed moving from lying on your back to sitting on the side of a flat bed without using bedrails?: A Little Help needed moving to and from a bed  to a chair (including a wheelchair)?: A Little Help needed standing up from a chair using your arms (e.g., wheelchair or bedside chair)?: A Little Help needed to walk in hospital room?: A Little Help needed climbing 3-5 steps with a railing? : A Little 6 Click Score: 18    End of Session   Activity Tolerance: Patient tolerated treatment well Patient left: in chair;with call bell/phone within reach;with chair alarm set;with family/visitor present Nurse Communication: Mobility status PT Visit Diagnosis: Other abnormalities of gait and mobility (R26.89);Pain Pain - Right/Left: Left Pain - part of body: Leg     Time: 1001-1045 PT Time Calculation (min) (ACUTE ONLY): 44 min  Charges:    $Gait Training: 23-37 mins $Therapeutic Activity: 8-22 mins PT General Charges $$ ACUTE PT VISIT: 1 Visit                     Merryl Hacker, PT Acute Rehabilitation Services Office: [phone]    Natalya Domzalski B Bryan Goin 09/06/2022, 10:50 AM

## 2022-09-06 NOTE — Plan of Care (Signed)
  Problem: Pain Managment: Goal: General experience of comfort will improve Outcome: Progressing   Problem: Safety: Goal: Ability to remain free from injury will improve Outcome: Progressing   Problem: Skin Integrity: Goal: Risk for impaired skin integrity will decrease Outcome: Progressing   

## 2022-09-06 NOTE — Discharge Summary (Signed)
DISCHARGE SUMMARY  Mandy Hanson  MR#: [id number]  DOB:[birth year]  Date of Admission: 09/01/2022 Date of Discharge: 09/06/2022  Attending Physician:Weslyn Holsonback Silvestre Gunner, MD  Patient's OZH:YQMVH, Sonny Masters, MD  Disposition: D/C home   Follow-up Appts:  Follow-up Information     Haddix, Gillie Manners, MD. Schedule an appointment as soon as possible for a visit in 2 week(s).   Specialty: Orthopedic Surgery Why: For wound check and repeat x-rays Contact information: [address] [place] [address] [phone]         [facility] Follow up.   Why: For home health services PT OT they will call you in a few days to schedule your first home visit Contact information: [address] [place] [address] [phone]         [facility] Follow up.   Why: [facility] will be providing a RW and 3:1 to the hospital room before you are discharged home. Contact information: [address] [place] [address] [phone]                 Discharge Diagnoses: Comminuted and displaced fracture of the distal left femur just above previous knee arthroplasty Constipation Poorly controlled pain Asthma GERD Mild hyponatremia  Initial presentation: 72 year old with a history of asthma, GERD, and OA who was transferred to [facility] from [facility] after she fell walking on a dock and ultimately suffered a left femoral periprosthetic fracture. The fall appeared to clearly be mechanical in nature and the patient was without complaint other than the expected pain from this significant fracture. Vital signs were stable and she was afebrile at presentation.   Hospital Course:  Comminuted and displaced fracture of the distal left femur just above previous knee arthroplasty Care per Orthopedic Trauma team - to OR 8/30 for open reduction internal fixation of left supracondylar distal femur fracture - cleared to be weightbearing  as tolerated -Lovenox for DVT prophylaxis while hospitalized with discharge on DOAC - PT/OT recommend home health which has been ordered -initially did poorly with therapy postoperatively with mobility limited by pain, but with adjustment in regimen her mobility improved markedly   Constipation Chronic history of IBS with constipation now worsened by bed rest as well as narcotics - bowel regimen adjusted significantly 9/2 with results of bowel movement   Poorly controlled pain Pain medication regimen adjusted significantly 9/2 with significant improvement in pain control   Asthma well-controlled and stable during this hospital stay   GERD Continue PPI   Mild hyponatremia Appears to be chronic in nature with review of sodium over last 10 years revealing baseline range of 129-133 -sodium has been stable during this hospital stay  Allergies as of 09/06/2022       Reactions   Tape Itching   Normal surgical tape does not cause a problem        Medication List     TAKE these medications    albuterol 108 (90 Base) MCG/ACT inhaler Commonly known as: VENTOLIN HFA Inhale 2 puffs into the lungs every 6 (six) hours as needed for wheezing or shortness of breath.   apixaban 2.5 MG Tabs tablet Commonly known as: Eliquis Take 1 tablet (2.5 mg total) by mouth 2 (two) times daily.   azelastine 0.1 % nasal spray Commonly known as: ASTELIN Place 1 spray into both nostrils 2 (two) times daily.   CITRUCEL PO Take 2 tablets by mouth daily as needed (digestion).   esomeprazole  40 MG capsule Commonly known as: NEXIUM Take 40 mg by mouth in the morning and at bedtime.   fluticasone 50 MCG/ACT nasal spray Commonly known as: FLONASE Place 2 sprays into both nostrils 2 (two) times daily.   fluticasone-salmeterol 115-21 MCG/ACT inhaler Commonly known as: ADVAIR HFA Inhale 2 puffs into the lungs 2 (two) times daily.   ibuprofen 800 MG tablet Commonly known as: ADVIL Take 1 tablet (800 mg  total) by mouth every 8 (eight) hours as needed for fever or mild pain.   lubiprostone 24 MCG capsule Commonly known as: AMITIZA Take 24 mcg by mouth 2 (two) times daily with a meal.   methocarbamol 500 MG tablet Commonly known as: ROBAXIN Take 1 tablet (500 mg total) by mouth every 6 (six) hours as needed for muscle spasms.   montelukast 10 MG tablet Commonly known as: SINGULAIR Take 10 mg by mouth at bedtime.   oxyCODONE 5 MG immediate release tablet Commonly known as: Oxy IR/ROXICODONE Take 1 tablet (5 mg total) by mouth every 4 (four) hours as needed for severe pain.   polyethylene glycol 17 g packet Commonly known as: MIRALAX / GLYCOLAX Take 17 g by mouth 2 (two) times daily.   Trulance 3 MG Tabs Generic drug: Plecanatide Take 1 tablet by mouth daily.   vitamin D3 25 MCG tablet Commonly known as: CHOLECALCIFEROL Take 1 tablet (1,000 Units total) by mouth daily.               Durable Medical Equipment  (From admission, onward)           Start     Ordered   09/04/22 1100  For home use only DME Bedside commode  Once       Question:  Patient needs a bedside commode to treat with the following condition  Answer:  Weakness   09/04/22 1059   09/04/22 1100  For home use only DME Walker rolling  Once       Question Answer Comment  Walker: With 5 Inch Wheels   Patient needs a walker to treat with the following condition Weakness      09/04/22 1059            Day of Discharge BP 127/62 (BP Location: Left Arm)   Pulse 89   Temp 98 F (36.7 C) (Oral)   Resp 18   Ht 5\' 10"  (1.778 m)   Wt 90.1 kg   SpO2 100%   BMI 28.50 kg/m   Physical Exam: General: No acute respiratory distress Lungs: Clear to auscultation bilaterally without wheezes or crackles Cardiovascular: Regular rate and rhythm without murmur gallop or rub normal S1 and S2 Abdomen: Nontender, nondistended, soft, bowel sounds positive, no rebound, no ascites, no appreciable  mass Extremities: No significant cyanosis, clubbing, or edema bilateral lower extremities  Basic Metabolic Panel: Recent Labs  Lab 09/01/22 0828 09/03/22 0321 09/04/22 0559  NA 127* 132* 133*  K 3.7 3.6 3.8  CL 97* 97* 101  CO2 20* 25 25  GLUCOSE 110* 137* 98  BUN 5* 7* 8  CREATININE 0.66 0.66 0.77  CALCIUM 7.7* 8.8* 8.2*    CBC: Recent Labs  Lab 09/01/22 0828 09/03/22 0321 09/04/22 0559  WBC 5.9 8.1 6.0  NEUTROABS 5.0  --   --   HGB 12.1 10.3* 10.1*  HCT 35.4* 30.5* 29.9*  MCV 99.4 99.7 98.4  PLT 228 235 280    Time spent in discharge (includes decision making & examination of pt):  35 minutes  09/06/2022, 11:52 AM   Lonia Blood, MD [facility] Office  [phone]

## 2022-09-06 NOTE — Progress Notes (Signed)
Orthopaedic Trauma Progress Note  SUBJECTIVE: Patient doing well today.  Pain better controlled after switching to oxycodone.  Had a bowel movement last night, feeling much better.  Feels ready for discharge today.  No chest pain. No SOB. No nausea/vomiting. No other complaints.  Husband not currently at bedside, heading to the hospital now for today's therapy sessions.  OBJECTIVE:  Vitals:   09/06/22 0504 09/06/22 0754  BP: 114/78 127/62  Pulse: (!) 101 89  Resp: 18   Temp: 97.7 F (36.5 C) 98 F (36.7 C)  SpO2: 100% 100%    General: Sitting up in bed, no acute distress.  Pleasant and cooperative Respiratory: No increased work of breathing.  Left lower extremity: Dressings removed, incisions are clean, dry, intact.  Abrasion anterior tibia stable without signs of infection.  Ankle dorsiflexion/plantarflexion tact.  Tolerates very gentle knee range of motion.  Endorses sensation throughout extremity.  Neurovascularly intact.  IMAGING: Stable post op imaging.   LABS: No results found for this or any previous visit (from the past 24 hour(s)).  ASSESSMENT: Mandy Hanson is a 72 y.o. female, 4 Days Post-Op s/p  OPEN REDUCTION INTERNAL FIXATION (ORIF) DISTAL FEMUR FRACTURE  CV/Blood loss: Acute blood loss anemia, Hgb 9.1 on 09/04/22. Hemodynamically stable  PLAN: Weightbearing: WBAT LLE ROM: Unrestricted ROM  Incisional and dressing care: Reinforce dressings as needed  Showering: Okay when showering getting incisions wet Orthopedic device(s): None  Pain management: Continue current multimodal regimen VTE prophylaxis: Lovenox, SCDs ID:  Ancef 2gm post op Foley/Lines:  No foley, KVO IVFs Impediments to Fracture Healing: Vitamin D level low normal at 30, will start on supplementation Dispo: PT/OT evaluation ongoing, currently recommending home health therapies.  Okay for discharge from ortho standpoint once cleared by medicine team and therapies.  I have sent discharge Rx for pain  medication, DVT prophylaxis, vitamin D supplementation to patient's pharmacy on file.  D/C recommendations: -Oxycodone 5 mg for pain control -Eliquis 2.5 mg twice daily x 30 days for DVT prophylaxis -Continue 1000 units daily Vit D supplementation  Follow - up plan: 2 weeks after discharge for wound check and repeat x-rays   Contact information:  Truitt Merle MD, Thyra Breed PA-C. After hours and holidays please check [email or web address] for group call information for [facility]   Thompson Caul, PA-C [phone] (office) [email or web address]

## 2023-04-19 ENCOUNTER — Other Ambulatory Visit: Payer: Self-pay | Admitting: Family Medicine

## 2023-04-19 DIAGNOSIS — Z1231 Encounter for screening mammogram for malignant neoplasm of breast: Secondary | ICD-10-CM

## 2023-05-15 ENCOUNTER — Encounter (HOSPITAL_COMMUNITY): Payer: Self-pay

## 2023-05-22 ENCOUNTER — Ambulatory Visit
Admission: RE | Admit: 2023-05-22 | Discharge: 2023-05-22 | Disposition: A | Discharge status: Home / Self Care | Source: Ambulatory Visit | Attending: Family Medicine | Admitting: Family Medicine

## 2023-05-22 DIAGNOSIS — Z1231 Encounter for screening mammogram for malignant neoplasm of breast: Secondary | ICD-10-CM

## 2023-08-18 ENCOUNTER — Encounter (INDEPENDENT_AMBULATORY_CARE_PROVIDER_SITE_OTHER): Payer: Medicare Other | Admitting: Ophthalmology

## 2023-10-06 ENCOUNTER — Encounter (INDEPENDENT_AMBULATORY_CARE_PROVIDER_SITE_OTHER): Admitting: Ophthalmology

## 2023-10-06 DIAGNOSIS — H43813 Vitreous degeneration, bilateral: Secondary | ICD-10-CM | POA: Diagnosis not present

## 2023-10-06 DIAGNOSIS — H33302 Unspecified retinal break, left eye: Secondary | ICD-10-CM

## 2023-10-06 DIAGNOSIS — H4423 Degenerative myopia, bilateral: Secondary | ICD-10-CM

## 2023-12-07 ENCOUNTER — Other Ambulatory Visit (HOSPITAL_COMMUNITY): Payer: Self-pay | Admitting: Orthopaedic Surgery

## 2023-12-07 ENCOUNTER — Ambulatory Visit (HOSPITAL_COMMUNITY)
Admission: RE | Admit: 2023-12-07 | Discharge: 2023-12-07 | Disposition: A | Source: Ambulatory Visit | Attending: Surgery | Admitting: Surgery

## 2023-12-07 DIAGNOSIS — R609 Edema, unspecified: Secondary | ICD-10-CM | POA: Diagnosis present

## 2023-12-15 ENCOUNTER — Ambulatory Visit (INDEPENDENT_AMBULATORY_CARE_PROVIDER_SITE_OTHER)

## 2023-12-15 ENCOUNTER — Encounter: Payer: Self-pay | Admitting: Podiatry

## 2023-12-15 ENCOUNTER — Ambulatory Visit: Admitting: Podiatry

## 2023-12-15 DIAGNOSIS — L600 Ingrowing nail: Secondary | ICD-10-CM | POA: Diagnosis not present

## 2023-12-15 DIAGNOSIS — M2042 Other hammer toe(s) (acquired), left foot: Secondary | ICD-10-CM | POA: Diagnosis not present

## 2023-12-15 DIAGNOSIS — M7751 Other enthesopathy of right foot: Secondary | ICD-10-CM | POA: Diagnosis not present

## 2023-12-15 DIAGNOSIS — M2041 Other hammer toe(s) (acquired), right foot: Secondary | ICD-10-CM

## 2023-12-15 MED ORDER — TRIAMCINOLONE ACETONIDE 10 MG/ML IJ SUSP
10.0000 mg | Freq: Once | INTRAMUSCULAR | Status: AC
  Administered 2023-12-15: 10 mg via INTRA_ARTICULAR

## 2023-12-15 NOTE — Patient Instructions (Signed)

## 2023-12-15 NOTE — Progress Notes (Signed)
 Subjective:   Patient ID: Mandy Hanson, female   DOB: 73 y.o.   MRN: [id number]   HPI Patient presents with an incurvated painful hallux right big toe swelling of the right big toe with arthritis and hammertoe deformity bilateral.  Patient states that this has been ongoing and hard for her to wear shoe gear with comfortably.  Patient does not smoke likes to be active   Review of Systems  All other systems reviewed and are negative.       Objective:  Physical Exam Vitals and nursing note reviewed.  Constitutional:      Appearance: She is well-developed.  Pulmonary:     Effort: Pulmonary effort is normal.  Musculoskeletal:        General: Normal range of motion.  Skin:    General: Skin is warm.  Neurological:     Mental Status: She is alert.     Neurovascular status intact muscle strength found to be adequate range of motion adequate with incurvated medial border right big toe painful when crossed swelling of the inner phalangeal joint and metatarsal phalangeal joint right big toe and digital deformities 2 through 4 bilateral but flexible.  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  Sterile prep done I did inject around the MPJ and is as a second procedure with 3 mg dexamethasone  Kenalog 5 mg Xylocaine  and I went ahead then went to the toenail removed the medial border exposed matrix applied phenol 3 applications 30 seconds followed by alcohol lavage sterile dressing gave instructions on soaks wear dressing 24 hours take it off earlier if throbbing were to recur and encouraged to call questions  concerns.  I evaluated the digits and do not recommend surgery  X-rays indicate quite a bit of arthritis of the inner phalangeal joint right big toe with probable history of injury moderate on the left with digital deformities bilateral but flexible

## 2024-10-07 ENCOUNTER — Encounter (INDEPENDENT_AMBULATORY_CARE_PROVIDER_SITE_OTHER): Admitting: Ophthalmology
